# Patient Record
Sex: Male | Born: 1970 | Race: White | Hispanic: No | Smoking: Never smoker
Health system: Southern US, Community
[De-identification: ages and names within clinical notes are randomized; demographics above are authoritative.]

## PROBLEM LIST (undated history)

## (undated) DIAGNOSIS — K529 Noninfective gastroenteritis and colitis, unspecified: Secondary | ICD-10-CM

## (undated) DIAGNOSIS — M8580 Other specified disorders of bone density and structure, unspecified site: Secondary | ICD-10-CM

## (undated) DIAGNOSIS — M129 Arthropathy, unspecified: Secondary | ICD-10-CM

## (undated) DIAGNOSIS — T7840XA Allergy, unspecified, initial encounter: Secondary | ICD-10-CM

## (undated) DIAGNOSIS — J45909 Unspecified asthma, uncomplicated: Secondary | ICD-10-CM

## (undated) DIAGNOSIS — F419 Anxiety disorder, unspecified: Secondary | ICD-10-CM

## (undated) DIAGNOSIS — J703 Chronic drug-induced interstitial lung disorders: Secondary | ICD-10-CM

## (undated) DIAGNOSIS — E079 Disorder of thyroid, unspecified: Secondary | ICD-10-CM

## (undated) DIAGNOSIS — M1990 Unspecified osteoarthritis, unspecified site: Secondary | ICD-10-CM

## (undated) DIAGNOSIS — K432 Incisional hernia without obstruction or gangrene: Secondary | ICD-10-CM

## (undated) DIAGNOSIS — D239 Other benign neoplasm of skin, unspecified: Secondary | ICD-10-CM

## (undated) DIAGNOSIS — K565 Intestinal adhesions [bands], unspecified as to partial versus complete obstruction: Secondary | ICD-10-CM

## (undated) DIAGNOSIS — G43909 Migraine, unspecified, not intractable, without status migrainosus: Secondary | ICD-10-CM

## (undated) DIAGNOSIS — K219 Gastro-esophageal reflux disease without esophagitis: Secondary | ICD-10-CM

## (undated) DIAGNOSIS — Z5189 Encounter for other specified aftercare: Secondary | ICD-10-CM

## (undated) DIAGNOSIS — K5080 Crohn's disease of both small and large intestine without complications: Secondary | ICD-10-CM

## (undated) DIAGNOSIS — Z8719 Personal history of other diseases of the digestive system: Secondary | ICD-10-CM

## (undated) DIAGNOSIS — G4700 Insomnia, unspecified: Secondary | ICD-10-CM

## (undated) DIAGNOSIS — IMO0002 Reserved for concepts with insufficient information to code with codable children: Secondary | ICD-10-CM

## (undated) DIAGNOSIS — K91850 Pouchitis: Secondary | ICD-10-CM

## (undated) DIAGNOSIS — G5761 Lesion of plantar nerve, right lower limb: Secondary | ICD-10-CM

## (undated) HISTORY — DX: Noninfective gastroenteritis and colitis, unspecified: K52.9

## (undated) HISTORY — DX: Gastro-esophageal reflux disease without esophagitis: K21.9

## (undated) HISTORY — DX: Anxiety disorder, unspecified: F41.9

## (undated) HISTORY — DX: Arthropathy, unspecified: M12.9

## (undated) HISTORY — DX: Personal history of other diseases of the digestive system: Z87.19

## (undated) HISTORY — PX: COLECTOMY: SHX59

## (undated) HISTORY — DX: Unspecified asthma, uncomplicated: J45.909

## (undated) HISTORY — DX: Unspecified osteoarthritis, unspecified site: M19.90

## (undated) HISTORY — DX: Migraine, unspecified, not intractable, without status migrainosus: G43.909

## (undated) HISTORY — PX: FLEXIBLE SIGMOIDOSCOPY: SHX1649

## (undated) HISTORY — DX: Chronic drug-induced interstitial lung disorders: J70.3

## (undated) HISTORY — DX: Crohn's disease of both small and large intestine without complications: K50.80

## (undated) HISTORY — DX: Other specified disorders of bone density and structure, unspecified site: M85.80

## (undated) HISTORY — DX: Disorder of thyroid, unspecified: E07.9

## (undated) HISTORY — DX: Incisional hernia without obstruction or gangrene: K43.2

## (undated) HISTORY — DX: Other benign neoplasm of skin, unspecified: D23.9

## (undated) HISTORY — PX: OTHER SURGICAL HISTORY: SHX169

## (undated) HISTORY — DX: Encounter for other specified aftercare: Z51.89

## (undated) HISTORY — DX: Pouchitis: K91.850

## (undated) HISTORY — DX: Reserved for concepts with insufficient information to code with codable children: IMO0002

## (undated) HISTORY — DX: Allergy, unspecified, initial encounter: T78.40XA

## (undated) HISTORY — DX: Insomnia, unspecified: G47.00

---

## 2002-08-05 ENCOUNTER — Encounter: Payer: Self-pay | Admitting: Internal Medicine

## 2002-09-19 DIAGNOSIS — K5080 Crohn's disease of both small and large intestine without complications: Secondary | ICD-10-CM | POA: Insufficient documentation

## 2003-12-18 HISTORY — PX: FUNCTIONAL ENDOSCOPIC SINUS SURGERY: SUR616

## 2004-08-19 DIAGNOSIS — Z8719 Personal history of other diseases of the digestive system: Secondary | ICD-10-CM

## 2004-08-19 DIAGNOSIS — K91850 Pouchitis: Secondary | ICD-10-CM

## 2004-08-19 HISTORY — DX: Personal history of other diseases of the digestive system: Z87.19

## 2004-08-19 HISTORY — DX: Pouchitis: K91.850

## 2005-01-17 HISTORY — PX: SIGMOIDOSCOPY: SUR1295

## 2005-01-25 ENCOUNTER — Encounter: Payer: Self-pay | Admitting: Internal Medicine

## 2005-11-22 ENCOUNTER — Encounter: Payer: Self-pay | Admitting: Internal Medicine

## 2006-04-16 ENCOUNTER — Encounter: Payer: Self-pay | Admitting: Internal Medicine

## 2006-04-16 HISTORY — PX: COLONOSCOPY W/ BIOPSIES: SHX1374

## 2007-06-24 ENCOUNTER — Ambulatory Visit: Payer: Self-pay | Admitting: Internal Medicine

## 2007-06-24 LAB — CONVERTED CEMR LAB
ALT: 13 units/L (ref 0–53)
AST: 18 units/L (ref 0–37)
Albumin: 3.8 g/dL (ref 3.5–5.2)
Basophils Absolute: 0.1 10*3/uL (ref 0.0–0.1)
Calcium: 9.5 mg/dL (ref 8.4–10.5)
Chloride: 103 meq/L (ref 96–112)
Creatinine, Ser: 1 mg/dL (ref 0.4–1.5)
Eosinophils Absolute: 0.1 10*3/uL (ref 0.0–0.6)
Eosinophils Relative: 1.2 % (ref 0.0–5.0)
GFR calc non Af Amer: 91 mL/min
HCT: 47.1 % (ref 39.0–52.0)
MCHC: 34.3 g/dL (ref 30.0–36.0)
Neutrophils Relative %: 72.3 % (ref 43.0–77.0)
Phosphorus: 3 mg/dL (ref 2.3–4.6)
Platelets: 310 10*3/uL (ref 150–400)
RBC: 5.31 M/uL (ref 4.22–5.81)
RDW: 13 % (ref 11.5–14.6)
Sodium: 141 meq/L (ref 135–145)
Total Bilirubin: 2 mg/dL — ABNORMAL HIGH (ref 0.3–1.2)
Vit D, 1,25-Dihydroxy: 18 — ABNORMAL LOW (ref 30–89)
WBC: 10.7 10*3/uL — ABNORMAL HIGH (ref 4.5–10.5)

## 2007-08-03 ENCOUNTER — Encounter: Payer: Self-pay | Admitting: Internal Medicine

## 2007-10-09 DIAGNOSIS — M8580 Other specified disorders of bone density and structure, unspecified site: Secondary | ICD-10-CM | POA: Insufficient documentation

## 2007-10-09 DIAGNOSIS — M461 Sacroiliitis, not elsewhere classified: Secondary | ICD-10-CM | POA: Insufficient documentation

## 2008-02-15 ENCOUNTER — Ambulatory Visit: Payer: Self-pay | Admitting: Family Medicine

## 2008-02-15 DIAGNOSIS — L723 Sebaceous cyst: Secondary | ICD-10-CM | POA: Insufficient documentation

## 2008-05-13 ENCOUNTER — Ambulatory Visit: Payer: Self-pay | Admitting: Family Medicine

## 2008-06-01 ENCOUNTER — Encounter: Payer: Self-pay | Admitting: Internal Medicine

## 2008-08-08 ENCOUNTER — Telehealth: Payer: Self-pay | Admitting: Family Medicine

## 2008-08-18 ENCOUNTER — Ambulatory Visit: Payer: Self-pay | Admitting: Internal Medicine

## 2008-08-18 DIAGNOSIS — K91850 Pouchitis: Secondary | ICD-10-CM | POA: Insufficient documentation

## 2008-08-29 ENCOUNTER — Encounter: Payer: Self-pay | Admitting: Internal Medicine

## 2008-08-29 ENCOUNTER — Ambulatory Visit: Payer: Self-pay | Admitting: Internal Medicine

## 2008-08-29 DIAGNOSIS — K5080 Crohn's disease of both small and large intestine without complications: Secondary | ICD-10-CM

## 2008-08-29 HISTORY — DX: Crohn's disease of both small and large intestine without complications: K50.80

## 2008-09-01 ENCOUNTER — Encounter: Payer: Self-pay | Admitting: Internal Medicine

## 2008-09-13 ENCOUNTER — Encounter: Payer: Self-pay | Admitting: Internal Medicine

## 2008-09-28 ENCOUNTER — Encounter: Payer: Self-pay | Admitting: Internal Medicine

## 2008-10-03 LAB — CONVERTED CEMR LAB: Vit D, 1,25-Dihydroxy: 37 (ref 30–89)

## 2008-10-13 ENCOUNTER — Encounter: Payer: Self-pay | Admitting: Internal Medicine

## 2008-12-01 ENCOUNTER — Encounter: Payer: Self-pay | Admitting: Internal Medicine

## 2009-04-04 ENCOUNTER — Telehealth: Payer: Self-pay | Admitting: Family Medicine

## 2009-04-11 ENCOUNTER — Encounter: Payer: Self-pay | Admitting: Internal Medicine

## 2009-05-02 ENCOUNTER — Encounter: Payer: Self-pay | Admitting: Internal Medicine

## 2009-06-26 ENCOUNTER — Ambulatory Visit: Payer: Self-pay | Admitting: Family Medicine

## 2009-06-26 ENCOUNTER — Telehealth: Payer: Self-pay | Admitting: Family Medicine

## 2009-06-26 LAB — CONVERTED CEMR LAB
Free T4: 0.8 ng/dL (ref 0.6–1.6)
T3, Free: 3.3 pg/mL (ref 2.3–4.2)
TSH: 1.26 microintl units/mL (ref 0.35–5.50)

## 2009-08-10 ENCOUNTER — Telehealth: Payer: Self-pay | Admitting: Family Medicine

## 2009-08-10 ENCOUNTER — Ambulatory Visit: Payer: Self-pay | Admitting: Family Medicine

## 2009-08-10 LAB — CONVERTED CEMR LAB: Uric Acid, Serum: 7.5 mg/dL (ref 4.0–7.8)

## 2009-08-21 ENCOUNTER — Encounter: Payer: Self-pay | Admitting: Internal Medicine

## 2009-08-21 ENCOUNTER — Telehealth (INDEPENDENT_AMBULATORY_CARE_PROVIDER_SITE_OTHER): Payer: Self-pay

## 2009-09-05 ENCOUNTER — Encounter: Payer: Self-pay | Admitting: Internal Medicine

## 2010-01-08 ENCOUNTER — Encounter: Payer: Self-pay | Admitting: Internal Medicine

## 2010-04-13 ENCOUNTER — Ambulatory Visit: Payer: Self-pay | Admitting: Psychology

## 2010-04-17 ENCOUNTER — Ambulatory Visit: Payer: Self-pay | Admitting: Internal Medicine

## 2010-04-27 ENCOUNTER — Ambulatory Visit: Payer: Self-pay | Admitting: Psychology

## 2010-04-30 ENCOUNTER — Encounter: Payer: Self-pay | Admitting: Family Medicine

## 2010-05-04 ENCOUNTER — Ambulatory Visit: Payer: Self-pay | Admitting: Psychology

## 2010-05-11 ENCOUNTER — Ambulatory Visit: Payer: Self-pay | Admitting: Psychology

## 2010-05-21 ENCOUNTER — Ambulatory Visit: Payer: Self-pay | Admitting: Psychology

## 2010-05-24 ENCOUNTER — Telehealth: Payer: Self-pay | Admitting: Internal Medicine

## 2010-06-18 ENCOUNTER — Encounter: Payer: Self-pay | Admitting: Internal Medicine

## 2010-06-19 ENCOUNTER — Telehealth: Payer: Self-pay | Admitting: Internal Medicine

## 2010-06-22 ENCOUNTER — Encounter: Payer: Self-pay | Admitting: Family Medicine

## 2010-06-22 ENCOUNTER — Ambulatory Visit: Payer: Self-pay | Admitting: Psychology

## 2010-07-11 ENCOUNTER — Ambulatory Visit: Payer: Self-pay | Admitting: Psychology

## 2010-07-20 ENCOUNTER — Ambulatory Visit: Payer: Self-pay | Admitting: Psychology

## 2010-08-03 ENCOUNTER — Ambulatory Visit: Payer: Self-pay | Admitting: Psychology

## 2010-08-09 ENCOUNTER — Ambulatory Visit: Payer: Self-pay | Admitting: Psychology

## 2010-08-17 ENCOUNTER — Ambulatory Visit: Payer: Self-pay | Admitting: Psychology

## 2010-08-31 ENCOUNTER — Ambulatory Visit
Admission: RE | Admit: 2010-08-31 | Discharge: 2010-08-31 | Payer: Self-pay | Source: Home / Self Care | Attending: Psychology | Admitting: Psychology

## 2010-09-14 ENCOUNTER — Ambulatory Visit
Admission: RE | Admit: 2010-09-14 | Discharge: 2010-09-14 | Payer: Self-pay | Source: Home / Self Care | Attending: Psychology | Admitting: Psychology

## 2010-09-18 NOTE — Letter (Signed)
Summary: Immunization record  Proof of Immunizations/vendor license/[facility]   Imported By: Bubba Hales 08/24/2009 08:26:38  _____________________________________________________________________  External Attachment:    Type:   Image     Comment:   External Document

## 2010-09-18 NOTE — Progress Notes (Signed)
Summary: elevated bilii = Gilbert's   Phone Note Other Incoming   Caller: Dr. Tobie Lords Summary of Call: T bili ios 2+ with all other LFTs ok old labs show t. 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

## 2010-09-18 NOTE — Miscellaneous (Signed)
Summary: ppd reading   Clinical Lists Changes  Observations: Added new observation of TB PPDRESULT: negative (06/22/2010 14:53) Added new observation of PPD RESULT: < 38m (06/22/2010 14:53) Added new observation of TB-PPD RDDTE: 05/02/2010 (06/22/2010 14:53)      PPD Results    Date of reading: 05/02/2010    Results: < 578m   Interpretation: negative

## 2010-09-18 NOTE — Assessment & Plan Note (Signed)
Summary: f/u--ch.    History of Present Illness Visit Type: Follow-up Visit Primary GI MD: Silvano Rusk MD [facility] Primary Provider: Stevie Kern, MD Requesting Provider: n/a Chief Complaint: Patient here for f/u He states that he is feeling better at this time. History of Present Illness:   40 yo with Crohn's disease s/p proctocolectomy (originally thought to have UC) He has been well on Remicade and receiving it through [facility] Occasionally he will have a painful but reducible hernia through his incision. Has seen Dr. Margot Chimes who advised no surgery unless absolutely needed.  We discussed need for vaccines today. Thinks he had Pneumovax 4-5 yrs ago at [facility]. has had A and B vaccines running and exercising, training for marathons  wife busy with CRNA school, au pair helping with kids      GI Review of Systems      Denies abdominal pain, acid reflux, belching, bloating, chest pain, dysphagia with liquids, dysphagia with solids, heartburn, loss of appetite, nausea, vomiting, vomiting blood, weight loss, and  weight gain.        Denies anal fissure, black tarry stools, change in bowel habit, constipation, diarrhea, diverticulosis, fecal incontinence, heme positive stool, hemorrhoids, irritable bowel syndrome, jaundice, light color stool, liver problems, rectal bleeding, and  rectal pain.    Current Medications (verified): 1)  Remicade 100 Mg Solr (Infliximab) .... Infuse Every 6 Weeks 2)  D 1000 Plus  Tabs (Fa-Cyanocobalamin-B6-D-Ca) .... Take 1 Tablet By Mouth Once A Day As Needed 3)  Qvar 40 Mcg/act Aers (Beclomethasone Dipropionate) .... Take 1 Puff By Mouth Twice Daily  Allergies (verified): 1)  ! * 6 Mercaptopurine  Past History:  Past Medical History: Reviewed history from 08/18/2008 and no changes required. Crohn's Colitis/Ileitis, pouchitis Incisional Hernia Herniated disc Osteopenia Hypovitaminosis D Inflammatory Arthropathy and  Sacroiliitis Hyperthyroidism/Thyroiditis Dysplastic nevi Folliculitis Chronic lung disease associated with IBD  Past Surgical History: Total colectomy Ileoanal pull-through Functional endoscopic sinus surgery  Family History: Reviewed history from 08/18/2008 and no changes required. father a pulmonologist in [place] in excellent health. Mother also in excellent health.  Has had a history of ulcerative colitis. One sister Raquel Sarna in good health.  No medical problems Family History of Colon Cancer: Grandmother Family History of Colitis/Crohn's: Mother (colitis)  Social History: Occupation: Secondary school teacher rep Married (Dr. Honor Junes daughter), 2 children Wife RN [employer] ICU and in nurse anesthetists school Never Smoked Alcohol use-yes-1 once daily  Drug use-no Regular exercise-yes  Vital Signs:  Patient profile:   40 year old male Height:      70 inches Weight:      151 pounds BMI:     21.74 BSA:     1.85 Pulse rate:   68 / minute Pulse rhythm:   regular BP sitting:   100 / 68  (right arm) Cuff size:   regular  Vitals Entered By: Madlyn Frankel CMA Deborra Medina) (April 17, 2010 10:53 AM)  Physical Exam  General:  Well developed, well nourished, no acute distress. Lungs:  Clear throughout to auscultation. Heart:  Regular rate and rhythm; no murmurs, rubs,  or bruits. Abdomen:  surgical scars low midline and RLQ some hernia effect soft non-tender no masses   Impression & Recommendations:  Problem # 1:  CROHN'S DISEASE, LARGE AND SMALL INTESTINES (ICD-555.2) Assessment Unchanged IBD dx 2004, UC vs. indeterminant colitis. ended up with colectomy and ileostomy then ileoanal pull through, J pouch. Subsequent pouchitis and realization of Crohn's disease. Had lung disease related to Crohn's and  Remicade started about 2007 and has helped tremendously. Allergic to 6MP. Also with sacroiliitis in past Overall well at this time and to continue Remicade See me about once a year routinely  and as needed for problems.  Problem # 2:  LONG-TERM USE OF REMICADE (ICD-V58.69) Assessment: Unchanged we discussed vaccines today and will catch up on those. He will request influenza and Pneumovax through PCP Dr. Sherren Mocha. He has had Hep A and B vaccines, varicellavaccine or immunity, DpT 2007 and MMR vaccines.  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  Patient Instructions: 1)  Please schedule a follow-up appointment in 1 year.  We will send you a reminder. 2)  Please discuss Pneumovax with Dr. Sherren Mocha at your next visit. 3)  Copy sent to : Tobie Lords, MD, Stevie Kern, MD 4)  The medication list was reviewed and reconciled.  All changed / newly prescribed medications were explained.  A complete medication list was provided to the patient / caregiver.

## 2010-09-18 NOTE — Progress Notes (Signed)
Summary: PPD   ---- 05/24/2010 2:21 PM, Westley Hummer CMA Deborra Medina) wrote: Colletta Maryland,  He came for a placement of a PPD but he did not return for reading.  thanks  rachel  ---- 05/22/2010 9:02 AM, Abelino Derrick CMA (AAMA) wrote: Rayburn Go,  This pt came in for his PPD on 04/30/10---do you know if he came back to have it read?  Is it documented somewhere?  We need documentation for his Remicade infusions.  If he did have it read can your office document it in the flowsheet? Thanks for your help. Colletta Maryland ------------------------------  Phone Note Outgoing Call   Call placed by: Abelino Derrick CMA Deborra Medina),  May 24, 2010 2:45 PM Details for Reason: pt needs PPD for Remicade Summary of Call: LM to [place] at home and work number. Initial call taken by: Abelino Derrick CMA Deborra Medina),  May 24, 2010 2:46 PM  Follow-up for Phone Call        [facility] from pt.  He states that Dr. Sherren Mocha looked at PPD on 05/02/10 and it was negative.  I advised pt to see if Dr. Sherren Mocha or Dorian Pod could chart negative PPD results in EMR.  Pt aware that he will need second PPD if this can not be done. Immunization record updated. Follow-up by: Abelino Derrick CMA Deborra Medina),  May 24, 2010 3:12 PM      Immunization History:  Hepatitis B Immunization History:    Hepatitis B # 1:  historical (01/11/1992)    Hepatitis B # 2:  historical (02/11/1992)    Hepatitis B # 3:  historical (07/13/1992)  MMR Immunization History:    MMR # 1:  historical (10/19/1973)    MMR # 2:  historical (03/21/1978)  Varicella Immunization History:    History of chickenpox:  yes (11/17/1980)  Tetanus/Td Immunization History:    Tetanus/Td:  historical (06/19/2006)  Hepatitis A Immunization History:    Hepatitis A # 1:  historical (08/24/2005)    Hepatitis A # 2:  historical (02/04/2006)  Appended Document: PPD I spoke to [facility] again today and he will go by Dr. Honor Junes office today to get this taken care of.  Advised pt  that we would have cancel next Remicade infusion if this is not taken care of.  Pt voices understanding.

## 2010-09-18 NOTE — Progress Notes (Signed)
Summary: Schedule PPD skin test   Phone Note Outgoing Call Call back at [facility] Phone [phone]   Call placed by: Barb Merino RN, CGRN,  August 21, 2009 10:53 AM Call placed to: Patient Summary of Call: I called patient to schedule his annual PPD skin test, he informed me had it done with Dr Sherren Mocha, He has faxed me a copy of the results and I will enter it on the flow sheet. Negative result from 05-18-09 Initial call taken by: Barb Merino RN, [place],  August 21, 2009 10:54 AM

## 2010-09-18 NOTE — Miscellaneous (Signed)
Summary: vaccines   Clinical Lists Changes  Observations: Added new observation of FLU VAX: Historical (04/30/2010 16:45) Added new observation of PNEUMOVAX: Historical (04/30/2010 16:45)      Immunization History:  Pneumovax Immunization History:    Pneumovax:  historical (04/30/2010)  Influenza Immunization History:    Influenza:  historical (04/30/2010)  ppd given today

## 2010-09-24 ENCOUNTER — Other Ambulatory Visit: Payer: Self-pay | Admitting: *Deleted

## 2010-09-24 DIAGNOSIS — R05 Cough: Secondary | ICD-10-CM

## 2010-09-24 DIAGNOSIS — R059 Cough, unspecified: Secondary | ICD-10-CM

## 2010-09-24 MED ORDER — HYDROCODONE-HOMATROPINE 5-1.5 MG/5ML PO SYRP: 5.0000 mL | ORAL_SOLUTION | Freq: Every day | ORAL | Status: AC

## 2010-09-24 NOTE — Telephone Encounter (Signed)
rx called into [facility]

## 2010-09-28 ENCOUNTER — Ambulatory Visit (INDEPENDENT_AMBULATORY_CARE_PROVIDER_SITE_OTHER): Payer: 59 | Admitting: Psychology

## 2010-09-28 DIAGNOSIS — F411 Generalized anxiety disorder: Secondary | ICD-10-CM

## 2010-10-12 ENCOUNTER — Ambulatory Visit (INDEPENDENT_AMBULATORY_CARE_PROVIDER_SITE_OTHER): Payer: 59 | Admitting: Psychology

## 2010-10-12 DIAGNOSIS — F411 Generalized anxiety disorder: Secondary | ICD-10-CM

## 2010-10-29 ENCOUNTER — Other Ambulatory Visit: Payer: Self-pay | Admitting: *Deleted

## 2010-10-29 MED ORDER — ALBUTEROL SULFATE HFA 108 (90 BASE) MCG/ACT IN AERS: 2.0000 | INHALATION_SPRAY | Freq: Four times a day (QID) | RESPIRATORY_TRACT | Status: DC | PRN

## 2010-10-29 MED ORDER — PREDNISONE 10 MG PO TABS: 10.0000 mg | ORAL_TABLET | Freq: Every day | ORAL | Status: AC

## 2011-01-01 NOTE — Assessment & Plan Note (Signed)
[place] OFFICE NOTE   NAME:Charles [name], Charles [name]                         MRN:          [id number]  DATE:06/24/2007                            DOB:          1970/11/23    CHIEF COMPLAINT:  Crohn's disease, on Remicade, establish care.   HISTORY:  Dvid is a 40 year old white man, Dr. Honor Junes son-in-law, who  developed inflammatory bowel disease problems in 2004.  He was in  [place].  He was thought to have ulcerative colitis or indeterminate  colitis.  He had a total colectomy, and eventually an ileoanal pull  through after he had J-pouch creation and then take down of his  ileostomy and ileoanal anastomosis.  Subsequent to that, he had problems  with pouchitis and ulcers, and through workups with the [facility] and  at the [place], it was determined that he probably had  Crohn's disease.  A variety of treatments were tried, but he ended up on  Remicade therapy which has controlled his symptoms, but he has to take  it every 6 weeks.  He just had a treatment last week, he is on 5 mg/kg  dosing.  He is in the process of moving his family here.  He had been in  the [place] area, he works for [employer], in Estate agent.  He had his  last Remicade back in [place] at [facility] under  the direction of Dr. David Stall.  His father is also a physician, a  pulmonologist in [place].  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  He has had  inflammatory arthropathy with the sacroiliitis.  He has had a history of  disk herniation with sciatic and residual weakness in the right leg  noted from [facility] of May 2007.  I do not think that is an  active problem now.   MEDICATIONS:  1. Remicade 5 mg/kg every 6 weeks.  2. Qvar 80 mcg two twice daily.  3. Flovent.   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   HISTORY:  Additional history as above.  The patient had been on Enbrel  which had sort of helped his lung and rheumatologic problems but  subsequently was found that he had Crohn's and he was switched to  Remicade.  He has had ileitis discovered subsequent to his original  pouchitis problems.  We have a colonoscopy report from August, 2007  demonstrating serpiginous ulcers in the past suspicious for Crohn's  ileitis and an ileal ulcer suspicious for Crohn's ileitis as well.  He  had a sigmoidoscopy June, 2006 with pouchitis.  Biopsies demonstrated  inflammatory changes with Crohn's disease.  The last bone densitometry I  have is from November 22, 2005 with a T score of -1.5 in the lumbar spine,  left hip is -.03, right hip -.04, left forearm 2.5. He was osteopenic in  the spine.  Note that  he is hepatitis B surface antigen negative in  October, 2007 prior to starting Remicade as well as hepatitis B core  antibody totals negative.  His [employer] IBD first step confirmatory  system showed that markers were confirmed.  They were suggestive of  ulcerative colitis but he acts like Crohn's disease.   FAMILY HISTORY:  His mother has ulcerative colitis.   PAST MEDICAL HISTORY:  As described above.   SOCIAL HISTORY:  He is married as mentioned above.  He has one son.  He  is an Loss adjuster, chartered.  He drinks some alcohol.  No tobacco or drugs.   REVIEW OF SYSTEMS:  He does have some back pain, some allergic sinus  problems.  All other systems are negative.   PHYSICAL EXAMINATION:  GENERAL APPEARANCE:  Reveals a well-developed,  thin, white male.  VITAL SIGNS:  Height 5 feet 11 inches. Weight 154.  Blood pressure  104/80. Pulse 76 and regular.  HEENT:  The eyes are anicteric.  ENT:  Normal mouth, nose and pharynx.  No mouth ulcers.  Lips, teeth  and gums in good repair.  NECK:  Supple. No thyromegaly or mass.  LUNGS:  Clear, resonant.  HEART:  S1, S2.  No rubs, no gallops.  ABDOMEN:  Shows a low midline scar as well as a right ileostomy scar.  There may be some small herniation in these areas.  RECTAL:  Inspection shows a small erythematous lesion on the right  cheek.  There is no fluctuance below it; there is no purulent drainage.  There is some mild skin irritation around the anus.  There are no  significant skin tags, fissuring or changes like that noticed, no  fistulae or abscess.  LYMPHATIC:  No neck, supraclavicular or groin adenopathy.  MUSCULOSKELETAL:  There is no obvious arthritic deformity of the hands.  SKIN:  Multiple nevi.  Warm, dry, no acute rash.  NEUROPSYCHIATRIC:  He is alert and oriented x3.  Appropriate affect.   ASSESSMENT:  1. Crohn's disease status post total colectomy and ileoanal pull-      through with original diagnosis being severe ulcerative colitis.  2. Pouchitis and ileitis problems as above.  3. Sacroiliitis and inflammatory arthropathy related to Crohn's      disease.  4. Chronic lung disease related to inflammatory bowel disease.  5. Transient hyperthyroidism in the past.  6. Incisional hernia.  7. Osteopenia with low vitamin E levels in the past.  8. Chronic Remicade therapy.  9. Note that he has had Celiac antibodies that are negative as well.   PLAN:  1. Labs today to include CBC, CMET, 250-hydroxy vitamin D level, TSH,      C-reactive protein, phosphorus level.  These are all normal except      his C-reactive protein that is slightly up at 6.  His TSH is 1.44.      Liver function tests, phosphorus normal.  Calcium level okay at      9.5.  His white count was slightly up at 10.7.  There is a minimal      monocytosis on the machine automated differential.  His bilirubin      is 2.0 total with a direct of 0.3 suggestive of Gilbert's.  2. Referral to Dr. Thomos Lemons for followup of his  arthropathy as well      as Remicade infusions every 6 weeks.  He will be due the week      before Christmas.  3. Will either need a primary  care physician or perhaps a      pulmonologist to follow him given his history of lung disease.  I      do not have any of the studies from his lung disease in the records      that I received, i.e., bronchoscopy, etc.  He has had a      bronchoscopy in [place]. His father's partner apparently performed      that and diagnosed this inflammatory lung process.  As best I can      tell, that is better.  He may need some chest imaging.  It looks      like he had chronic nodularity in the lungs, thought secondary to      the inflammatory bowel disease from the information I have.  4. He has had a DEXA scan recently and he will bring those results to      me.   I appreciate the opportunity to care for this patient.     Gatha Mayer, MD,FACG  Electronically Signed    CEG/MedQ  DD: 06/25/2007  DT: 06/26/2007  Job #: [id number]   cc:   Michael Litter, M.D.

## 2011-01-01 NOTE — Assessment & Plan Note (Signed)
[place] OFFICE NOTE   NAME:[name], Charles                         MRN:          [id number]  DATE:06/24/2007                            DOB:          06-21-1971    CHIEF COMPLAINT:  Crohn's disease, on Remicade, establish care.   HISTORY:  [name] is a 40 year old white man, Dr. Honor Junes son-in-law, who  developed inflammatory bowel disease problems in 2004.  He was in  [place].  He was thought to have ulcerative colitis or indeterminate  colitis.  He had a total colectomy, and eventually an ileoanal pull  through after he had J-pouch creation and then take down of his  ileostomy and ileoanal anastomosis.  Subsequent to that, he had problems  with pouchitis and ulcers, and through workups with the [facility] and  at the [place], it was determined that he probably had  Crohn's disease.  A variety of treatments were tried, but he ended up on  Remicade therapy which has controlled his symptoms, but he has to take  it every 6 weeks.  He just had a treatment last week, he is on 5 mg/kg  dosing.  He is in the process of moving his family here.  He had been in  the [place] area, he works for [employer], the Art gallery manager.  He  had his last Remicade back in [place] at [facility]  under the direction of Dr. David Stall.  His father is also a physician, a  pulmonologist in [place].  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  He has had  inflammatory arthropathy with the sacroiliitis.  He has had a history of  disk herniation with sciatic and residual weakness in the right leg  noted from [facility] of May 2007.  I do not think that is  an  active problem now.   INCOMPLETE     Gatha Mayer, MD,FACG  Electronically Signed    CEG/MedQ  DD: 06/25/2007  DT: 06/26/2007  Job #: [id number]

## 2011-01-18 ENCOUNTER — Other Ambulatory Visit: Payer: Self-pay | Admitting: Family Medicine

## 2011-01-21 ENCOUNTER — Other Ambulatory Visit: Payer: Self-pay | Admitting: *Deleted

## 2011-01-21 MED ORDER — ELETRIPTAN HYDROBROMIDE 20 MG PO TABS: 20.0000 mg | ORAL_TABLET | ORAL | Status: DC | PRN

## 2011-02-26 ENCOUNTER — Other Ambulatory Visit: Payer: Self-pay | Admitting: *Deleted

## 2011-02-26 MED ORDER — CEPHALEXIN 500 MG PO CAPS: 500.0000 mg | ORAL_CAPSULE | Freq: Two times a day (BID) | ORAL | Status: AC

## 2011-03-11 ENCOUNTER — Other Ambulatory Visit: Payer: Self-pay | Admitting: *Deleted

## 2011-03-11 MED ORDER — FLUOCINONIDE 0.05 % EX GEL: Freq: Two times a day (BID) | CUTANEOUS | Status: AC

## 2011-04-11 ENCOUNTER — Encounter: Payer: Self-pay | Admitting: Internal Medicine

## 2011-04-30 ENCOUNTER — Ambulatory Visit (INDEPENDENT_AMBULATORY_CARE_PROVIDER_SITE_OTHER): Payer: 59 | Admitting: Family Medicine

## 2011-04-30 ENCOUNTER — Encounter: Payer: Self-pay | Admitting: Family Medicine

## 2011-04-30 DIAGNOSIS — Z0000 Encounter for general adult medical examination without abnormal findings: Secondary | ICD-10-CM

## 2011-04-30 DIAGNOSIS — K5080 Crohn's disease of both small and large intestine without complications: Secondary | ICD-10-CM

## 2011-04-30 DIAGNOSIS — Z23 Encounter for immunization: Secondary | ICD-10-CM

## 2011-04-30 LAB — POCT URINALYSIS DIPSTICK
Blood, UA: NEGATIVE
Protein, UA: NEGATIVE
Spec Grav, UA: 6
Urobilinogen, UA: 0.2

## 2011-04-30 NOTE — Progress Notes (Signed)
  Subjective:    Patient ID: Charles [name], male    DOB: 1971-04-17, 40 y.o.   MRN: [id number]  HPI Mat Is a 40 year old, married man nonsmoker comes in today for a general medical examination for his employer [employer]  He is seen by Dr. Tobie Lords every 6 weeks and gets Remicade because of his history of underlying Crohn's disease.  Dr. Ouida Sills does his labs and follows those.  His last treatment was at an 8 week interval.  Because he was on Avelox for two weeks because of sinusitis.  The sinusitis has resolved.  He currently takes Qvar 81 puff b.i.d. And vitamin D because of a history of osteopenia.  Is also considering a facetectomy, and has a varicocele.  It seems to be getting bigger in the left scrotum.  He would like checked.  I referred him to Dr. Mikey Kirschner.    His vaccinations were reviewed and brought up to date.  He gets biannual dermatologic evaluations by Dr. Amy Martinique.  His dermatologist because of a history of dysplastic nevi.   Review of Systems General review of systems otherwise negative    Objective:   Physical Exam  Well-developed well-nourished, male in no acute distress     Assessment & Plan:  Crohn's disease, currently stable.  History of osteopenia continue vitamin D, diet and exercise.  Varicocele left and consideration of a mastectomy referred to Dr. Keturah Barre.

## 2011-05-20 ENCOUNTER — Other Ambulatory Visit: Payer: Self-pay | Admitting: *Deleted

## 2011-05-20 MED ORDER — HYDROCODONE-HOMATROPINE 5-1.5 MG/5ML PO SYRP: 5.0000 mL | ORAL_SOLUTION | Freq: Four times a day (QID) | ORAL | Status: AC | PRN

## 2011-05-31 ENCOUNTER — Ambulatory Visit (INDEPENDENT_AMBULATORY_CARE_PROVIDER_SITE_OTHER): Payer: BC Managed Care – PPO | Admitting: Internal Medicine

## 2011-05-31 ENCOUNTER — Encounter: Payer: Self-pay | Admitting: Internal Medicine

## 2011-05-31 VITALS — BP 124/76 | HR 88 | Ht 70.0 in | Wt 151.0 lb

## 2011-05-31 DIAGNOSIS — D899 Disorder involving the immune mechanism, unspecified: Secondary | ICD-10-CM

## 2011-05-31 DIAGNOSIS — D849 Immunodeficiency, unspecified: Secondary | ICD-10-CM

## 2011-05-31 DIAGNOSIS — M461 Sacroiliitis, not elsewhere classified: Secondary | ICD-10-CM

## 2011-05-31 DIAGNOSIS — Z79899 Other long term (current) drug therapy: Secondary | ICD-10-CM | POA: Insufficient documentation

## 2011-05-31 DIAGNOSIS — K5080 Crohn's disease of both small and large intestine without complications: Secondary | ICD-10-CM

## 2011-05-31 NOTE — Progress Notes (Signed)
  Subjective:    Patient ID: Charles [name], male    DOB: April 22, 1971, 40 y.o.   MRN: [id number]  HPI 40 year old married white man with Crohn's disease. Maintained on Remicade. Here for annual followup. He has had some short spells of diarrhea at times but thinks things are a bit better since his wife has completed CRNA school and there is less stress.He has been doing well overall. He thinks that some of his diarrhea spells are also related to dietary indiscretion. Two marathons in last year. Work going well.    Review of Systems As above    Objective:   Physical Exam General:  Thin WDWN NAD Eyes: anicteric Lungs: clear Heart: S1S2 no rubs, murmurs or gallops Abdomen: soft and nontender, BS+, surgical scars present with ventral hernia Rectal :  Inspected and no abnormalities Ext: no edema          Assessment & Plan:

## 2011-05-31 NOTE — Patient Instructions (Signed)
return to see Dr. Carlean Purl in 1 year.

## 2011-05-31 NOTE — Assessment & Plan Note (Addendum)
Doing well - any Remicade per Dr. Ouida Sills. He can see me annually and sooner as needed. He raised the question of when he could ever stop Remicade and is really not known. Given all the problems he had over the years do not recommend that at this time. We'll see what the future brings as far as therapy and understanding of this.

## 2011-05-31 NOTE — Assessment & Plan Note (Signed)
Reviewed immunosuppression and higher risk of infections. Extra vigilance advised.

## 2011-06-11 ENCOUNTER — Telehealth: Payer: Self-pay

## 2011-06-11 NOTE — Telephone Encounter (Signed)
I have left a message for the patient to call to discuss annual TB skin test and flu vaccine for Remicade

## 2011-06-18 NOTE — Telephone Encounter (Signed)
Left message for patient to call back  

## 2011-06-18 NOTE — Telephone Encounter (Signed)
Patient had had PPD skin test and Flu vaccine in September with Dr Sherren Mocha

## 2011-09-20 ENCOUNTER — Other Ambulatory Visit: Payer: Self-pay | Admitting: *Deleted

## 2011-09-20 MED ORDER — CEPHALEXIN 500 MG PO CAPS: 500.0000 mg | ORAL_CAPSULE | Freq: Two times a day (BID) | ORAL | Status: DC

## 2011-09-20 MED ORDER — HYDROCODONE-ACETAMINOPHEN 7.5-750 MG PO TABS: 1.0000 | ORAL_TABLET | Freq: Three times a day (TID) | ORAL | Status: AC | PRN

## 2012-03-24 ENCOUNTER — Ambulatory Visit: Payer: BC Managed Care – PPO | Admitting: Internal Medicine

## 2012-04-10 ENCOUNTER — Encounter: Payer: Self-pay | Admitting: Internal Medicine

## 2012-04-10 ENCOUNTER — Ambulatory Visit (INDEPENDENT_AMBULATORY_CARE_PROVIDER_SITE_OTHER): Payer: BC Managed Care – PPO | Admitting: Internal Medicine

## 2012-04-10 VITALS — BP 96/60 | HR 60 | Ht 69.5 in | Wt 151.5 lb

## 2012-04-10 DIAGNOSIS — K5080 Crohn's disease of both small and large intestine without complications: Secondary | ICD-10-CM

## 2012-04-10 DIAGNOSIS — D899 Disorder involving the immune mechanism, unspecified: Secondary | ICD-10-CM

## 2012-04-10 DIAGNOSIS — D849 Immunodeficiency, unspecified: Secondary | ICD-10-CM

## 2012-04-10 NOTE — Patient Instructions (Addendum)
You have been scheduled for a flexible sigmoidoscopy. Please follow the written instructions given to you at your visit today. If you use inhalers (even only as needed), please bring them with you on the day of your procedure.  We will obtain your last office note and labs from Dr. Ardis Hughs office.  Thank you for choosing me and [facility].  Gatha Mayer, M.D., [facility]

## 2012-04-10 NOTE — Assessment & Plan Note (Addendum)
He continues to do well as far as we know he is in remission. Plan on evaluation of his ileoanal pouch to look for any recurrent pouchitis which has been a problem.  He would like to consider coming off the medication, it's a reasonable consideration. Because he is pursuing life insurance right now it has been recommended that he not make any major changes so would not do that now but I will investigate as best I can what the potential outcomes R., based upon the literature. However ultimately, I think he knows that his trial of air, and there is a risk of needing continued therapy though perhaps not something as severe as Remicade since he has had a total colectomy. He is labeled as Crohn's disease though there are features that indicate he has ulcerative colitis as well based upon serology. He also had significant arthritis problems and lung disease thought related to his IBD. He has been doing well for approximately 5-6 years per Will take this in the consideration with any decision.

## 2012-04-11 ENCOUNTER — Encounter: Payer: Self-pay | Admitting: Internal Medicine

## 2012-04-11 NOTE — Progress Notes (Signed)
  Subjective:    Patient ID: Charles [name], male    DOB: [birth year], 41 y.o.   MRN: [id number]  HPI All of his inflammatory bowel disease. He remains on Remicade 5 mg per kilogram every 6 weeks through Dr. Ouida Sills. He has laboratory studies through that clinic. He reports no significant complaints at this time. He is pursuing life insurance and wonders if he really needs to continue Remicade chronically. His inflammatory bowel disease diagnosis and the use of Remicade substantially increases his preemie of this. After his visit I obtained copies of his 02/27/2012 note from Dr. Ouida Sills of rheumatology where he was doing well. Medications, allergies, past medical history, past surgical history, family history and social history are reviewed and updated in the EMR.   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    Objective:   Physical Exam General:  NAD, thin Eyes:   anicteric Lungs:  clear Heart:  S1S2 no rubs, murmurs or gallops Abdomen:  soft and nontender, BS+ surgical scars are present.   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 The remainder of that lab tests is normal. His white count was 5.5 hemoglobin 14.3 MCV 94. 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       Assessment & Plan:   1. CROHN'S DISEASE, LARGE AND SMALL INTESTINES   2. 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 Please see the problem oriented charting as well. 2. Plan for evaluation of his ileoanal pouch via endoscope. The risks and benefits as well as alternatives of endoscopic procedure(s) have been discussed and reviewed. All questions answered. The patient agrees to proceed. Further plans pending this, I do think is reasonable to consider stopping the Remicade and  observing plus or minus some other type of chronic therapy like VSL probiotic or intermittent antibiotics.  I appreciate the opportunity to care for this patient.  Cc: A. Tobie Lords, MD

## 2012-04-11 NOTE — Assessment & Plan Note (Signed)
No current problems he is tolerating Remicade therapy well. It is a legitimate question as to how long he needs to continue this. I will look into that.

## 2012-05-05 ENCOUNTER — Encounter: Payer: Self-pay | Admitting: Gastroenterology

## 2012-05-18 ENCOUNTER — Telehealth: Payer: Self-pay | Admitting: *Deleted

## 2012-05-18 MED ORDER — NAPHAZOLINE-ZINC SULFATE 0.02 % OP SOLN: 1.0000 [drp] | Freq: Two times a day (BID) | OPHTHALMIC | Status: DC

## 2012-05-18 NOTE — Telephone Encounter (Signed)
Patient calling for Rx for allergies for eyes. Vascon - A per Dr Sherren Mocha

## 2012-05-26 ENCOUNTER — Ambulatory Visit (INDEPENDENT_AMBULATORY_CARE_PROVIDER_SITE_OTHER): Payer: BC Managed Care – PPO | Admitting: Family Medicine

## 2012-05-26 ENCOUNTER — Encounter: Payer: Self-pay | Admitting: Family Medicine

## 2012-05-26 DIAGNOSIS — D235 Other benign neoplasm of skin of trunk: Secondary | ICD-10-CM

## 2012-05-26 DIAGNOSIS — Z0000 Encounter for general adult medical examination without abnormal findings: Secondary | ICD-10-CM

## 2012-05-26 DIAGNOSIS — Z23 Encounter for immunization: Secondary | ICD-10-CM

## 2012-05-26 NOTE — Progress Notes (Signed)
  Subjective:    Patient ID: Charles [name], male    DOB: [birth year], 41 y.o.   MRN: [id number]  HPImat is a 41 year old married male nonsmoker who comes in today for removal of 2 mol on his back  He's had a history of dysplastic nevi  Lesion #1 is 5 mm x5 mm left posterior upper back  Lesion #2 5 mm x 5 mm left flank T12  Both lesions were anesthetized with 1% Xylocaine with epinephrine after informed consent. The lesions were removed with 3 mm margins base was cauterized Band-Aids were applied they were sent for analysis. He tolerated the procedure no complications.  He was also given a flu shot and a TB skin test which will be read back-41-year-old Thursday    Review of Systems    general review of systems negative except for recurrent dysplastic nevi so far thankfully no skin cancers Objective:   Physical Exam Procedure see above       Assessment & Plan:  Probable dysplastic nevi x2 path pending

## 2012-05-26 NOTE — Patient Instructions (Signed)
Within 2 weeks we will call you the report

## 2012-05-28 LAB — TB SKIN TEST: TB Skin Test: NEGATIVE

## 2012-05-29 ENCOUNTER — Ambulatory Visit (AMBULATORY_SURGERY_CENTER): Payer: BC Managed Care – PPO | Admitting: Internal Medicine

## 2012-05-29 ENCOUNTER — Encounter: Payer: Self-pay | Admitting: Internal Medicine

## 2012-05-29 VITALS — BP 114/79 | HR 65 | Temp 98.1°F | Resp 11 | Ht 69.0 in | Wt 151.0 lb

## 2012-05-29 DIAGNOSIS — K91850 Pouchitis: Secondary | ICD-10-CM

## 2012-05-29 DIAGNOSIS — K5080 Crohn's disease of both small and large intestine without complications: Secondary | ICD-10-CM

## 2012-05-29 MED ORDER — SODIUM CHLORIDE 0.9 % IV SOLN: 500.0000 mL | INTRAVENOUS | Status: DC

## 2012-05-29 NOTE — Progress Notes (Signed)
Pt voided clear colored urine in the procedure room pre-sedation. Maw

## 2012-05-29 NOTE — Progress Notes (Signed)
Patient did not experience any of the following events: a burn prior to discharge; a fall within the facility; wrong site/side/patient/procedure/implant event; or a hospital transfer or hospital admission upon discharge from the facility. (G8907) Patient did not have preoperative order for IV antibiotic SSI prophylaxis. (G8918)  

## 2012-05-29 NOTE — Patient Instructions (Addendum)
There were some small ulcers and other inflammatory changes in the pouch. Biopsies taken. The ileum was inspected 100 cm and looked ok.  I will call you about the biopsy results.  Gatha Mayer, MD, FACG  YOU HAD AN ENDOSCOPIC PROCEDURE TODAY AT [facility]: Refer to the procedure report that was given to you for any specific questions about what was found during the examination.  If the procedure report does not answer your questions, please call your gastroenterologist to clarify.  If you requested that your care partner not be given the details of your procedure findings, then the procedure report has been included in a sealed envelope for you to review at your convenience later.  YOU SHOULD EXPECT: Some feelings of bloating in the abdomen. Passage of more gas than usual.  Walking can help get rid of the air that was put into your GI tract during the procedure and reduce the bloating. If you had a lower endoscopy (such as a colonoscopy or flexible sigmoidoscopy) you may notice spotting of blood in your stool or on the toilet paper. If you underwent a bowel prep for your procedure, then you may not have a normal bowel movement for a few days.  DIET: Your first meal following the procedure should be a light meal and then it is ok to progress to your normal diet.  A half-sandwich or bowl of soup is an example of a good first meal.  Heavy or fried foods are harder to digest and may make you feel nauseous or bloated.  Likewise meals heavy in dairy and vegetables can cause extra gas to form and this can also increase the bloating.  Drink plenty of fluids but you should avoid alcoholic beverages for 24 hours.  ACTIVITY: Your care partner should take you home directly after the procedure.  You should plan to take it easy, moving slowly for the rest of the day.  You can resume normal activity the day after the procedure however you should NOT DRIVE or use heavy machinery for 24 hours  (because of the sedation medicines used during the test).    SYMPTOMS TO REPORT IMMEDIATELY: A gastroenterologist can be reached at any hour.  During normal business hours, 8:30 AM to 5:00 PM Monday through Friday, call [phone].  After hours and on weekends, please call the GI answering service at [phone] who will take a message and have the physician on call contact you.   Following lower endoscopy (colonoscopy or flexible sigmoidoscopy):  Excessive amounts of blood in the stool  Significant tenderness or worsening of abdominal pains  Swelling of the abdomen that is new, acute  Fever of 100F or higher  Following upper endoscopy (EGD)  Vomiting of blood or coffee ground material  New chest pain or pain under the shoulder blades  Painful or persistently difficult swallowing  New shortness of breath  Fever of 100F or higher  Black, tarry-looking stools  FOLLOW UP: If any biopsies were taken you will be contacted by phone or by letter within the next 1-3 weeks.  Call your gastroenterologist if you have not heard about the biopsies in 3 weeks.  Our staff will call the home number listed on your records the next business day following your procedure to check on you and address any questions or concerns that you may have at that time regarding the information given to you following your procedure. This is a courtesy call and so if there is  no answer at the home number and we have not heard from you through the emergency physician on call, we will assume that you have returned to your regular daily activities without incident.  SIGNATURES/CONFIDENTIALITY: You and/or your care partner have signed paperwork which will be entered into your electronic medical record.  These signatures attest to the fact that that the information above on your After Visit Summary has been reviewed and is understood.  Full responsibility of the confidentiality of this discharge information lies with you  and/or your care-partner.

## 2012-05-29 NOTE — Op Note (Signed)
[place]  [address] [place], [address]   POUCHOSCOPY PROCEDURE REPORT  PATIENT: Charles [name], Charles [name]  MR#: [id number] BIRTHDATE: 1970/12/09 , 41  yrs. old GENDER: Male ENDOSCOPIST: Gatha Mayer, MD, [facility]  PROCEDURE DATE:  05/29/2012 PROCEDURE:   Pouchoscopy with biopsy ASA CLASS:   Class II INDICATIONS:follow up for previously diagnosed Crohn's disease. MEDICATIONS: propofol (Diprivan) 219m IV, MAC sedation, administered by CRNA, and These medications were titrated to patient response per physician's verbal order  DESCRIPTION OF PROCEDURE:   After the risks benefits and alternatives of the procedure were thoroughly explained, informed consent was obtained.  revealed no abnormalities of the rectum. The [id number] endoscope was introduced through the anus and advanced to the ileum      , limited by No adverse events experienced.   The quality of the prep was excellent .  The instrument was then slowly withdrawn as the mucosa was fully examined.       FINDINGS: Non-bleeding mucosal ulceration, intermittent across the area examined, was present in the pouch.  Multiple biopsies were performed using cold forceps.  there was associated erythema also. Just in the distal 5 cm or less. The anastomosis looked normal as did 100 cm ileum proximal. Retroflexion was not performed.    The scope was then withdrawn from the patient and the procedure terminated.  COMPLICATIONS: There were no complications.  ENDOSCOPIC IMPRESSION: Non-bleeding mucosal ulceration in the pouch; multiple biopsies were performed using cold forceps Looks like pouchitis.  RECOMMENDATIONS: await biopsy results consider Remicade antibodies, topical therapy      eSigned:  CGatha Mayer MD, FA[facility] Division10/06/2012 4:01 PM  CGO:VPCHEKBAOuida Sills MD The Patient JDorena Cookey MD

## 2012-05-29 NOTE — Progress Notes (Signed)
The pt tolerated the flex sig very well. Maw

## 2012-06-01 ENCOUNTER — Other Ambulatory Visit: Payer: Self-pay | Admitting: Family Medicine

## 2012-06-01 ENCOUNTER — Telehealth: Payer: Self-pay | Admitting: *Deleted

## 2012-06-01 DIAGNOSIS — J329 Chronic sinusitis, unspecified: Secondary | ICD-10-CM

## 2012-06-01 MED ORDER — CEPHALEXIN 500 MG PO CAPS: ORAL_CAPSULE | ORAL | Status: DC

## 2012-06-01 NOTE — Telephone Encounter (Signed)
  Follow up Call-  Call back number 05/29/2012  Post procedure Call Back phone  # [phone]  Permission to leave phone message Yes     Patient questions:  Message left to call us if necessary.

## 2012-06-09 ENCOUNTER — Other Ambulatory Visit: Payer: Self-pay | Admitting: Internal Medicine

## 2012-06-09 ENCOUNTER — Telehealth: Payer: Self-pay | Admitting: Internal Medicine

## 2012-06-09 MED ORDER — MESALAMINE 1000 MG RE SUPP: 1000.0000 mg | Freq: Every day | RECTAL | Status: DC

## 2012-06-09 NOTE — Progress Notes (Signed)
Quick Note:  Results discussed with patient No recall or letter I sent him a copy of path  He will use Canasa suppositories prn and let me know if he wants a tertiary referral about pouchitis ______

## 2012-06-09 NOTE — Telephone Encounter (Signed)
Patient reports he just spoke with Dr. Carlean Purl and he gave him the path results.

## 2012-07-30 ENCOUNTER — Ambulatory Visit (INDEPENDENT_AMBULATORY_CARE_PROVIDER_SITE_OTHER)
Admission: RE | Admit: 2012-07-30 | Discharge: 2012-07-30 | Disposition: A | Discharge status: Home / Self Care | Payer: BC Managed Care – PPO | Source: Ambulatory Visit | Attending: Family Medicine | Admitting: Family Medicine

## 2012-07-30 ENCOUNTER — Other Ambulatory Visit: Payer: Self-pay | Admitting: Family Medicine

## 2012-07-30 DIAGNOSIS — J329 Chronic sinusitis, unspecified: Secondary | ICD-10-CM

## 2012-07-30 MED ORDER — NEOMYCIN-POLYMYXIN-HC 3.5-10000-1 OT SUSP: 3.0000 [drp] | Freq: Four times a day (QID) | OTIC | Status: DC

## 2012-07-30 MED ORDER — TRAMADOL HCL 50 MG PO TABS: ORAL_TABLET | ORAL | Status: DC

## 2012-08-20 ENCOUNTER — Other Ambulatory Visit: Payer: Self-pay | Admitting: Family Medicine

## 2012-08-20 DIAGNOSIS — J329 Chronic sinusitis, unspecified: Secondary | ICD-10-CM

## 2012-08-24 ENCOUNTER — Ambulatory Visit (INDEPENDENT_AMBULATORY_CARE_PROVIDER_SITE_OTHER)
Admission: RE | Admit: 2012-08-24 | Discharge: 2012-08-24 | Disposition: A | Discharge status: Home / Self Care | Payer: 59 | Source: Ambulatory Visit | Attending: Family Medicine | Admitting: Family Medicine

## 2012-08-24 DIAGNOSIS — J329 Chronic sinusitis, unspecified: Secondary | ICD-10-CM

## 2012-11-16 ENCOUNTER — Other Ambulatory Visit: Payer: Self-pay | Admitting: *Deleted

## 2012-11-16 MED ORDER — ELETRIPTAN HYDROBROMIDE 20 MG PO TABS: 20.0000 mg | ORAL_TABLET | ORAL | Status: DC | PRN

## 2013-01-21 ENCOUNTER — Other Ambulatory Visit: Payer: Self-pay | Admitting: *Deleted

## 2013-01-21 MED ORDER — ALBUTEROL SULFATE HFA 108 (90 BASE) MCG/ACT IN AERS: 2.0000 | INHALATION_SPRAY | Freq: Four times a day (QID) | RESPIRATORY_TRACT | Status: DC | PRN

## 2013-03-05 DIAGNOSIS — K469 Unspecified abdominal hernia without obstruction or gangrene: Secondary | ICD-10-CM | POA: Insufficient documentation

## 2013-04-28 ENCOUNTER — Other Ambulatory Visit: Payer: Self-pay | Admitting: Family Medicine

## 2013-06-08 ENCOUNTER — Ambulatory Visit (INDEPENDENT_AMBULATORY_CARE_PROVIDER_SITE_OTHER): Payer: 59 | Admitting: *Deleted

## 2013-06-08 DIAGNOSIS — Z23 Encounter for immunization: Secondary | ICD-10-CM

## 2013-06-08 DIAGNOSIS — Z0000 Encounter for general adult medical examination without abnormal findings: Secondary | ICD-10-CM

## 2013-06-11 DIAGNOSIS — J45909 Unspecified asthma, uncomplicated: Secondary | ICD-10-CM | POA: Insufficient documentation

## 2013-08-05 ENCOUNTER — Other Ambulatory Visit: Payer: Self-pay | Admitting: Dermatology

## 2013-09-21 ENCOUNTER — Ambulatory Visit (INDEPENDENT_AMBULATORY_CARE_PROVIDER_SITE_OTHER): Payer: 59 | Admitting: Internal Medicine

## 2013-09-21 ENCOUNTER — Other Ambulatory Visit: Payer: 59

## 2013-09-21 ENCOUNTER — Encounter: Payer: Self-pay | Admitting: Internal Medicine

## 2013-09-21 VITALS — BP 140/82 | HR 82 | Ht 69.0 in | Wt 152.8 lb

## 2013-09-21 DIAGNOSIS — K91850 Pouchitis: Secondary | ICD-10-CM

## 2013-09-21 DIAGNOSIS — K5080 Crohn's disease of both small and large intestine without complications: Secondary | ICD-10-CM

## 2013-09-21 NOTE — Progress Notes (Signed)
         Subjective:    Patient ID: Charles [name], male    DOB: August 17, 1971, 43 y.o.   MRN: [id number]  HPI The patient is here for followup, he has a history of Crohn's disease a large and small intestines with colectomy and ileoanal pull-through when originally thought to have ulcerative colitis. He has been in remission other than some endoscopic pouchitis changes for a long time now. He has been maintained on Remicade. At the end of 2014 he had surgery at [facility] with hernia repair plastic surgery to improve postoperative changes from prior surgeries. He tolerated that well. His Remicadedoubt for about 10 weeks to limit potential side effects or adverse reactions with healing et Ronney Asters. He seemed to do okay when he was off the Remicade he says though he became ill after December 22 infusion. He thinks he might have had the flu at that time actually not this earlier reaction to Remicade but he is unsure. He did have Remicade again later this month.  As far as his gastrointestinal symptoms these are stable, he is not bothered with bleeding or diarrhea other than his postoperative changes. He is not using Canasa suppository or a regular basis. He remains concerned about whether or not he really needs to continue Remicade for ever, and questions withdrawing that.  His wife was diagnosed and treated with breast cancer last year. She is a candidate for total hysterectomy because of genetic issues that have been uncovered. His mother-in-law passed away from ovarian cancer last year. 2014 was stressful, obviously.  Review of Systems As above    Objective:   Physical Exam Thin but well-developed well-nourished no acute distress Abdomen shows nicely healing scars, some palpable suture material under the skin, nontender.    Assessment & Plan:   1. CROHN'S DISEASE, LARGE AND SMALL INTESTINES   2. Pouchitis    copy Sabino Gasser.D.

## 2013-09-21 NOTE — Assessment & Plan Note (Addendum)
OK at this time. He raises a seems about need to continue Remicade. I have explained it is unclear to me. I think starting with infliximab antibodies make sense. If those are high withdrawing Remicade and observing is not unreasonable though he could have a flare or recurrence of active disease. May need tertiary evaluation.

## 2013-09-21 NOTE — Patient Instructions (Addendum)
Go to the basement for labs today  Follow up in one year

## 2013-09-21 NOTE — Assessment & Plan Note (Signed)
Chronic issue - not symptomatic - seen on endoscopy

## 2013-09-24 ENCOUNTER — Encounter: Payer: Self-pay | Admitting: Internal Medicine

## 2013-09-24 ENCOUNTER — Telehealth: Payer: Self-pay | Admitting: Internal Medicine

## 2013-09-24 NOTE — Telephone Encounter (Signed)
Please refer him to Dr. Jeanne Ivan re: hx of Crohn's - s/p colectomy, advise re: long-term treatment I will send a letter once we get appt date Need to go way back to my first notes with him to get his old hx ----- Message ----- From: Kellie Moor, RN Sent: 09/24/2013 12:03 PM To: Gatha Mayer, MD Subject: FW: Visit Follow-Up Question    See my chart messages from today for further details.   Referral to Dr. Emelda Fear will be initiated

## 2013-09-27 LAB — INFLIXIMAB+AB (SERIAL MONITOR)
Anti-Infliximab Antibody: 45 ng/mL — ABNORMAL HIGH
Infliximab Drug Level: 15 ug/mL

## 2013-09-27 NOTE — Telephone Encounter (Signed)
Dr. Milas Hock does not have a specialty referral form.  Can you please do your summary letter and I will fax it up so we can get an appt set up with Dr. Emelda Fear.  I put his records in your office if you need them

## 2013-09-27 NOTE — Progress Notes (Signed)
Quick Note:  Let him know that he does have antibodies to remicade which suggests it is less effective or not working perhaps. Cc Dr. Ouida Sills (rheumatology)  Has he gotten a date to see Dr. Emelda Fear yet?   ______

## 2013-09-27 NOTE — Telephone Encounter (Signed)
OK will do that - disregard my ? On the infliximab Ab result

## 2013-09-30 ENCOUNTER — Encounter: Payer: Self-pay | Admitting: Internal Medicine

## 2013-10-06 NOTE — Telephone Encounter (Signed)
Per Dr. Carlean Purl will hold off on referral to Dr. Emelda Fear for now. Dr. Carlean Purl will discuss again with Dr. Ouida Sills prior to any referrals.

## 2013-10-13 ENCOUNTER — Encounter: Payer: Self-pay | Admitting: Internal Medicine

## 2013-11-09 ENCOUNTER — Telehealth: Payer: Self-pay | Admitting: Family Medicine

## 2013-11-09 MED ORDER — LORAZEPAM 1 MG PO TABS: ORAL_TABLET | ORAL | Status: DC

## 2013-11-09 NOTE — Telephone Encounter (Signed)
[facility] [place], [address] is requesting re-fill onLORazepam (ATIVAN) 1 MG tablet

## 2013-11-09 NOTE — Telephone Encounter (Signed)
Patient was scheduled for Dr. Emelda Fear for 12/27/13 8:00.  Patient is aware

## 2013-11-09 NOTE — Telephone Encounter (Signed)
Rx called in 

## 2013-12-27 DIAGNOSIS — K5090 Crohn's disease, unspecified, without complications: Secondary | ICD-10-CM | POA: Insufficient documentation

## 2013-12-27 DIAGNOSIS — K439 Ventral hernia without obstruction or gangrene: Secondary | ICD-10-CM | POA: Insufficient documentation

## 2013-12-30 ENCOUNTER — Telehealth: Payer: Self-pay | Admitting: Internal Medicine

## 2013-12-30 NOTE — Telephone Encounter (Signed)
Yes - I have seen the notes from Dr. Emelda Fear

## 2013-12-30 NOTE — Telephone Encounter (Signed)
Patient notified.  He is scheduled for flex 02/17/14 and pre-visit 6/26

## 2013-12-30 NOTE — Telephone Encounter (Signed)
Ok to set up flex??

## 2014-01-18 ENCOUNTER — Ambulatory Visit (INDEPENDENT_AMBULATORY_CARE_PROVIDER_SITE_OTHER): Payer: 59 | Admitting: Family Medicine

## 2014-01-18 ENCOUNTER — Other Ambulatory Visit (INDEPENDENT_AMBULATORY_CARE_PROVIDER_SITE_OTHER): Payer: 59

## 2014-01-18 ENCOUNTER — Encounter: Payer: Self-pay | Admitting: Family Medicine

## 2014-01-18 VITALS — BP 132/82 | HR 58 | Ht 71.0 in | Wt 148.0 lb

## 2014-01-18 DIAGNOSIS — M25579 Pain in unspecified ankle and joints of unspecified foot: Secondary | ICD-10-CM

## 2014-01-18 DIAGNOSIS — M25571 Pain in right ankle and joints of right foot: Secondary | ICD-10-CM

## 2014-01-18 DIAGNOSIS — G5761 Lesion of plantar nerve, right lower limb: Secondary | ICD-10-CM | POA: Insufficient documentation

## 2014-01-18 DIAGNOSIS — G5760 Lesion of plantar nerve, unspecified lower limb: Secondary | ICD-10-CM

## 2014-01-18 MED ORDER — DICLOFENAC SODIUM 2 % TD SOLN: 2.0000 "application " | Freq: Two times a day (BID) | TRANSDERMAL | Status: DC

## 2014-01-18 NOTE — Patient Instructions (Addendum)
Good to meet you Ice bath 20 minutes 2 times daily.  Pennsaid topically 2 times daily Vitamin D 2000 IU daily.  B6 123m daily.  Spenco orthotics sport insoles online or omega sports.  Exercises 3 times a week.  After running 4:1 ratio of carbs to protein  AKA chocolate milk, whey protein isolate.  Come back in 3 weeks.

## 2014-01-18 NOTE — Assessment & Plan Note (Addendum)
Patient does likely have the pain in the numbness of the foot secondary to this Morton's neuroma. Patient was given an injection today. We discussed topical anti-inflammatory. We will avoid oral anti-inflammatories [place]ica patient's past medical history. Patient will do icing protocol and we discussed over-the-counter orthotics and compression that could be beneficial. Patient is a try these interventions and come back again in 3 weeks. Patient continues to have pain I think he would be a candidate for custom orthotics.

## 2014-01-18 NOTE — Progress Notes (Signed)
[name] [address], [place] [address] Phone: [phone] Subjective:    I'm seeing this patient by the request  of:  TODD,JEFFREY ALLEN, MD   CC:  Right foot and ankle pain  WNU:[id number] Charles [name] is a 43 y.o. male coming in with complaint of right foot and ankle pain. Patient actually injured his left ankle back in October. Patient states that seem to resolve the when he started trying to run again over the course of the last several months he has started noticing more right-sided pain. Patient states after running approximately 4 miles he gets numbness in the anterior aspect of the foot and then has some cramping sensations or radiation of the numbness going up the posterior aspect of his ankle. Patient does not remember any true injury. Patient denies any discoloration or any swelling. Patient states most of the numbness seems to be more on the lateral aspect of the foot. Patient is an avid runner runs approximately half a marathon at least a month. Patient states that he does not have significant pain at baseline. Patient denies any pain with just regular walking. Patient is have a history of plantar fasciitis on this foot before that does get better with wearing specific shoes.     Past medical history, social, surgical and family history all reviewed in electronic medical record.   Review of Systems: No headache, visual changes, nausea, vomiting, diarrhea, constipation, dizziness, abdominal pain, skin rash, fevers, chills, night sweats, weight loss, swollen lymph nodes, body aches, joint swelling, muscle aches, chest pain, shortness of breath, mood changes.   Objective Blood pressure 132/82, pulse 58, height 5' 11"  (1.803 m), weight 148 lb (67.132 kg), SpO2 97.00%.  General: No apparent distress alert and oriented x3 mood and affect normal, dressed appropriately.  HEENT: Pupils equal, extraocular movements intact  Respiratory: Patient's  speak in full sentences and does not appear short of breath  Cardiovascular: No lower extremity edema, non tender, no erythema  Skin: Warm dry intact with no signs of infection or rash on extremities or on axial skeleton.  Abdomen: Soft nontender  Neuro: Cranial nerves II through XII are intact, neurovascularly intact in all extremities with 2+ DTRs and 2+ pulses.  Lymph: No lymphadenopathy of posterior or anterior cervical chain or axillae bilaterally.  Gait normal with good balance and coordination.  MSK:  Non tender with full range of motion and good stability and symmetric strength and tone of shoulders, elbows, wrist, hip, knees bilaterally.  Ankle: Right No visible erythema or swelling. Range of motion is full in all directions. Strength is 5/5 in all directions. Stable lateral and medial ligaments; squeeze test and kleiger test unremarkable; Talar dome mild tenderness No pain at base of 5th MT; No tenderness over cuboid; No tenderness over N spot or navicular prominence No tenderness on posterior aspects of lateral and medial malleolus No sign of peroneal tendon subluxations or tenderness to palpation Negative tarsal tunnel tinel's Able to walk 4 steps. The patient does have tenderness to palpation between the fourth and fifth metatarsal heads. Contralateral ankle does have what appears to be a ganglion cyst over the lateral malleolus. Otherwise exam is unremarkable.  Exam this is a patient does have collection of the transverse arch bilaterally. Patient does have a neutral hindfoot to. Patient does have a hypertrophy of the hallux flexor muscle.  MSK US performed of: *Right This study was ordered, performed, and interpreted by Charlann Boxer D.O.  Foot/Ankle:  All structures visualized.   Talar dome unremarkable  Ankle mortise with minimal effusion. Patient does have an overlapping varicose vein that does cause some mild compression of the dorsal nerve. Peroneus longus and brevis  tendons unremarkable on long and transverse views without sheath effusions. Posterior tibialis, flexor hallucis longus, and flexor digitorum longus tendons unremarkable on long and transverse views without sheath effusions. Achilles tendon visualized along length of tendon and unremarkable on long and transverse views without sheath effusion. Anterior Talofibular Ligament and Calcaneofibular Ligaments unremarkable and intact. Deltoid Ligament unremarkable and intact. Plantar fascia intact and without effusion, normal thickness. No increased doppler signal, cap sign, or thickening of tibial cortex. Power doppler signal normal. Neuroma noted between the fourth and fifth metatarsal heads.  IMPRESSION:  Morton's neuroma  After verbal consent patient was prepped with alcohol swabs and with a 25-gauge 1 inch needle was injected with 0.5 cc of 0.5% Marcaine and 0.5 cc of Kenalog 40 mg/dL. This was done under ultrasound guidance. Patient tolerated the procedure well with good pain relief immediately. Postinjection instructions given.      Impression and Recommendations:     This case required medical decision making of moderate complexity.

## 2014-02-08 ENCOUNTER — Ambulatory Visit (INDEPENDENT_AMBULATORY_CARE_PROVIDER_SITE_OTHER): Payer: 59 | Admitting: Family Medicine

## 2014-02-08 ENCOUNTER — Encounter: Payer: Self-pay | Admitting: Family Medicine

## 2014-02-08 VITALS — BP 140/80 | HR 95 | Ht 71.0 in | Wt 151.0 lb

## 2014-02-08 DIAGNOSIS — G5761 Lesion of plantar nerve, right lower limb: Secondary | ICD-10-CM

## 2014-02-08 DIAGNOSIS — G5760 Lesion of plantar nerve, unspecified lower limb: Secondary | ICD-10-CM

## 2014-02-08 NOTE — Assessment & Plan Note (Signed)
Patient is doing significantly better at this time. Patient is able to do all activities of daily living and is able to run. Patient will start increasing his activities even more if he has any significant discomfort we may want to consider custom orthotics. Patient will follow up on an as-needed basis .

## 2014-02-08 NOTE — Patient Instructions (Signed)
It is good  To see you Do what you want at this time Continue the braces and always have orthotics with running Ice after running would be a good idea and use topical as needed See me when you need me.

## 2014-02-08 NOTE — Progress Notes (Signed)
  [name] [name], [place] [address] Phone: [phone] Subjective:     CC:  Right foot and ankle pain followup  [id number] Charles [name] is a 43 y.o. male coming in with complaint of right foot and ankle pain. 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 Patient was given braces over-the-counter that he states has significantly decreased the pain in his ankles. In addition a that patient's Morton's neuroma after injection has completely resolved. Patient has been able to run without any significant discomfort. Patient is able to do all daily activities without any trouble. We did not do any anti-inflammatories secondary to patient's history of Crohn's disease.  Patient denies using the topical anti-inflammatories and states that this is helpful as well.     Past medical history, social, surgical and family history all reviewed in electronic medical record.   Review of Systems: No headache, visual changes, nausea, vomiting, diarrhea, constipation, dizziness, abdominal pain, skin rash, fevers, chills, night sweats, weight loss, swollen lymph nodes, body aches, joint swelling, muscle aches, chest pain, shortness of breath, mood changes.   Objective Blood pressure 140/80, pulse 95, height 5' 11"  (1.803 m), weight 151 lb (68.493 kg), SpO2 99.00%.  General: No apparent distress alert and oriented x3 mood and affect normal, dressed appropriately.  HEENT: Pupils equal, extraocular movements intact  Respiratory: Patient's speak in full sentences and does not appear short of breath  Cardiovascular: No lower extremity edema, non tender, no erythema  Skin: Warm dry intact with no signs of infection or rash on extremities or on axial skeleton.  Abdomen: Soft nontender  Neuro: Cranial nerves II through XII are intact, neurovascularly intact in all extremities with 2+ DTRs  and 2+ pulses.  Lymph: No lymphadenopathy of posterior or anterior cervical chain or axillae bilaterally.  Gait normal with good balance and coordination.  MSK:  Non tender with full range of motion and good stability and symmetric strength and tone of shoulders, elbows, wrist, hip, knees bilaterally.  Ankle: Right No visible erythema or swelling. Range of motion is full in all directions. Strength is 5/5 in all directions. Stable lateral and medial ligaments; squeeze test and kleiger test unremarkable; Talar dome nontender No pain at base of 5th MT; No tenderness over cuboid; No tenderness over N spot or navicular prominence No tenderness on posterior aspects of lateral and medial malleolus No sign of peroneal tendon subluxations or tenderness to palpation Negative tarsal tunnel tinel's Able to walk 4 steps. No tenderness between the metatarsal heads.   Exam this is a patient does have collection of the transverse arch bilaterally. Patient does have a neutral hindfoot to. Patient does have a hypertrophy of the hallux flexor muscle.     Impression and Recommendations:     This case required medical decision making of moderate complexity.

## 2014-02-11 ENCOUNTER — Ambulatory Visit (AMBULATORY_SURGERY_CENTER): Payer: Self-pay

## 2014-02-11 VITALS — Ht 70.0 in | Wt 149.0 lb

## 2014-02-11 DIAGNOSIS — K5090 Crohn's disease, unspecified, without complications: Secondary | ICD-10-CM

## 2014-02-11 DIAGNOSIS — K50919 Crohn's disease, unspecified, with unspecified complications: Secondary | ICD-10-CM

## 2014-02-11 NOTE — Progress Notes (Signed)
No allergies to eggs or soy No home oxygen No past problems with anesthesia No diet/weight loss meds  Has email  Emmi instructions given for colonoscopy

## 2014-02-14 ENCOUNTER — Encounter: Payer: Self-pay | Admitting: Family Medicine

## 2014-02-14 ENCOUNTER — Other Ambulatory Visit: Payer: Self-pay | Admitting: *Deleted

## 2014-02-14 ENCOUNTER — Telehealth: Payer: Self-pay | Admitting: Family Medicine

## 2014-02-14 MED ORDER — DEXLANSOPRAZOLE 30 MG PO CPDR
30.0000 mg | DELAYED_RELEASE_CAPSULE | Freq: Every day | ORAL | Status: DC
Start: 2014-02-14 — End: 2014-02-17

## 2014-02-14 MED ORDER — LORAZEPAM 1 MG PO TABS: ORAL_TABLET | ORAL | Status: DC

## 2014-02-14 MED ORDER — MOMETASONE FURO-FORMOTEROL FUM 100-5 MCG/ACT IN AERO: 2.0000 | INHALATION_SPRAY | Freq: Every day | RESPIRATORY_TRACT | Status: DC

## 2014-02-14 NOTE — Telephone Encounter (Signed)
Charles [name] is a 43 year old married male nonsmoker who comes in today for a review his medications  He takes dulera 2 puffs daily for chronic asthma  He takes Relpax 2-3 times yearly for migraine headaches  He was given dixelent 60 mg daily by Dr. Carmelina Peal his allergist when he had a chronic cough. They suspected a chronic cough was indeed reflux. Indeed this medication has helped. He's been on it for about 6 months plus.... he wants to now if he can you to come off of it or  decrease the dose. We discussed various options. He's going to try to take 60 mg Monday Wednesday Friday or take 30 mg daily.  He has sleep dysfunction manifested by waking up in the middle of the night  and he can go back to sleep. He's taken Ativan 1 mg dose one half tab each bedtime when necessary and this helps to sleep dysfunction. We talked about other options however since this works and he has no side effects we've elected to continue that treatment option at this time.  The Qvar has been discontinued  He does not use the albuterol. He runs and cycles and has no difficulty breathing.

## 2014-02-15 ENCOUNTER — Telehealth: Payer: Self-pay | Admitting: Family Medicine

## 2014-02-15 MED ORDER — MOMETASONE FURO-FORMOTEROL FUM 100-5 MCG/ACT IN AERO: 2.0000 | INHALATION_SPRAY | Freq: Every day | RESPIRATORY_TRACT | Status: DC

## 2014-02-15 NOTE — Telephone Encounter (Signed)
Error

## 2014-02-15 NOTE — Telephone Encounter (Signed)
[facility] needs verification on the directions for rx mometasone-formoterol (DULERA) 100-5 MCG/ACT AERO

## 2014-02-17 ENCOUNTER — Encounter: Payer: Self-pay | Admitting: Internal Medicine

## 2014-02-17 ENCOUNTER — Ambulatory Visit (AMBULATORY_SURGERY_CENTER): Payer: 59 | Admitting: Internal Medicine

## 2014-02-17 VITALS — BP 124/79 | HR 55 | Temp 98.6°F | Resp 13 | Ht 70.0 in | Wt 149.0 lb

## 2014-02-17 DIAGNOSIS — K5289 Other specified noninfective gastroenteritis and colitis: Secondary | ICD-10-CM

## 2014-02-17 DIAGNOSIS — K5090 Crohn's disease, unspecified, without complications: Secondary | ICD-10-CM

## 2014-02-17 MED ORDER — SODIUM CHLORIDE 0.9 % IV SOLN: 500.0000 mL | INTRAVENOUS | Status: DC

## 2014-02-17 NOTE — Op Note (Signed)
[place]  [address], [id number]   FLEXIBLE SIGMOIDOSCOPY PROCEDURE REPORT  PATIENT: Charles [name], [place]  MR#: [id number] BIRTHDATE: May 29, 1971 , 43  yrs. old GENDER: Male ENDOSCOPIST: Gatha Mayer, MD, [facility] PROCEDURE DATE:  02/17/2014 PROCEDURE:   Sigmoidoscopy with biopsy ASA CLASS:   Class II INDICATIONS:follow-up ?pouchitis in Crohn' s/p ileo-anal pull through. MEDICATIONS: propofol (Diprivan) 271m IV, MAC sedation, administered by CRNA, and These medications were titrated to patient response per physician's verbal order  DESCRIPTION OF PROCEDURE:   After the risks benefits and alternatives of the procedure were thoroughly explained, informed consent was obtained.  revealed no abnormalities of the pouch. The LB [id number]-H190 [id number] endoscope was introduced through the anus  and advanced to the ileum      , limited by No adverse events experienced.   The quality of the prep was excellent .  The instrument was then slowly withdrawn as the mucosa was fully examined.      COLON FINDINGS: 1) Two small ulcers in distal pouch - biopsied 2) Pouch and ileum up to 100 cm intubation normal. Retroflexed views revealed no abnormalities and Retroflexion was not performed due to a narrow rectal vault.    The scope was then withdrawn from the patient and the procedure terminated.  COMPLICATIONS: There were no complications.  ENDOSCOPIC IMPRESSION: 1) Two small ulcers in distal pouch - biopsied 2) Pouch and ileum up to 100 cm intubation normal  RECOMMENDATIONS: await biopsy results - will call    eSigned:  CGatha Mayer MD, [facility]07/09/2013 11:46 AM   CC:The Patient and JJeanne Ivan MD

## 2014-02-17 NOTE — Patient Instructions (Addendum)
There were two small ulcers in the end of the pouch. I took biopsies. Otherwise looked great. This is better than 2013.  Will call with results.  I appreciate the opportunity to care for you. Gatha Mayer, MD, FACG  YOU HAD AN ENDOSCOPIC PROCEDURE TODAY AT [facility]: Refer to the procedure report that was given to you for any specific questions about what was found during the examination.  If the procedure report does not answer your questions, please call your gastroenterologist to clarify.  If you requested that your care partner not be given the details of your procedure findings, then the procedure report has been included in a sealed envelope for you to review at your convenience later.  YOU SHOULD EXPECT: Some feelings of bloating in the abdomen. Passage of more gas than usual.  Walking can help get rid of the air that was put into your GI tract during the procedure and reduce the bloating. If you had a lower endoscopy (such as a colonoscopy or flexible sigmoidoscopy) you may notice spotting of blood in your stool or on the toilet paper. If you underwent a bowel prep for your procedure, then you may not have a normal bowel movement for a few days.  DIET: Your first meal following the procedure should be a light meal and then it is ok to progress to your normal diet.  A half-sandwich or bowl of soup is an example of a good first meal.  Heavy or fried foods are harder to digest and may make you feel nauseous or bloated.  Likewise meals heavy in dairy and vegetables can cause extra gas to form and this can also increase the bloating.  Drink plenty of fluids but you should avoid alcoholic beverages for 24 hours.  ACTIVITY: Your care partner should take you home directly after the procedure.  You should plan to take it easy, moving slowly for the rest of the day.  You can resume normal activity the day after the procedure however you should NOT DRIVE or use heavy machinery for 24  hours (because of the sedation medicines used during the test).    SYMPTOMS TO REPORT IMMEDIATELY: A gastroenterologist can be reached at any hour.  During normal business hours, 8:30 AM to 5:00 PM Monday through Friday, call [phone].  After hours and on weekends, please call the GI answering service at [phone] who will take a message and have the physician on call contact you.   Following lower endoscopy (colonoscopy or flexible sigmoidoscopy):  Excessive amounts of blood in the stool  Significant tenderness or worsening of abdominal pains  Swelling of the abdomen that is new, acute  Fever of 100F or higher   FOLLOW UP: If any biopsies were taken you will be contacted by phone or by letter within the next 1-3 weeks.  Call your gastroenterologist if you have not heard about the biopsies in 3 weeks.  Our staff will call the home number listed on your records the next business day following your procedure to check on you and address any questions or concerns that you may have at that time regarding the information given to you following your procedure. This is a courtesy call and so if there is no answer at the home number and we have not heard from you through the emergency physician on call, we will assume that you have returned to your regular daily activities without incident.  SIGNATURES/CONFIDENTIALITY: You and/or your care partner  have signed paperwork which will be entered into your electronic medical record.  These signatures attest to the fact that that the information above on your After Visit Summary has been reviewed and is understood.  Full responsibility of the confidentiality of this discharge information lies with you and/or your care-partner.

## 2014-02-17 NOTE — Progress Notes (Signed)
Procedure ends, to recovery, report given and VSS. 

## 2014-02-17 NOTE — Progress Notes (Signed)
Called to room to assist during endoscopic procedure.  Patient ID and intended procedure confirmed with present staff. Received instructions for my participation in the procedure from the performing physician.  

## 2014-02-21 ENCOUNTER — Telehealth: Payer: Self-pay | Admitting: *Deleted

## 2014-02-21 NOTE — Telephone Encounter (Signed)
  Follow up Call-  Call back number 02/17/2014 05/29/2012  Post procedure Call Back phone  # [phone]  Permission to leave phone message Yes Yes     Patient questions:  Do you have a fever, pain , or abdominal swelling? No. Pain Score  0 *  Have you tolerated food without any problems? Yes.    Have you been able to return to your normal activities? Yes.    Do you have any questions about your discharge instructions: Diet   No. Medications  No. Follow up visit  No.  Do you have questions or concerns about your Care? No.  Actions: * If pain score is 4 or above: No action needed, pain <4.

## 2014-02-25 NOTE — Progress Notes (Signed)
Quick Note:  I called him and left message about results - I lean toward not taking Tx at this time  Please fax a copy of the report and the procedure report to Dr. Jeanne Ivan also   [place] - no letter or recall ______

## 2014-02-27 NOTE — Progress Notes (Signed)
Quick Note:  Minimal inflammation "colitis" in I-A pouch Seems to be doing well I would lean toward not treating right now but will see what Dr. Emelda Fear says  [place] no letter or recall - phone and [employer] notifiaction ______

## 2014-03-10 ENCOUNTER — Encounter: Payer: Self-pay | Admitting: Internal Medicine

## 2014-04-22 ENCOUNTER — Telehealth: Payer: Self-pay | Admitting: Family Medicine

## 2014-04-22 ENCOUNTER — Ambulatory Visit: Payer: 59 | Admitting: Family Medicine

## 2014-04-22 NOTE — Telephone Encounter (Signed)
Pt called and had to r/s his appointment for today 04/22/14, he stated he was out of town with work. He is r/s for 05/05/14

## 2014-04-22 NOTE — Telephone Encounter (Signed)
Noted  

## 2014-05-01 ENCOUNTER — Emergency Department (INDEPENDENT_AMBULATORY_CARE_PROVIDER_SITE_OTHER): Payer: 59

## 2014-05-01 ENCOUNTER — Emergency Department (HOSPITAL_COMMUNITY)
Admission: EM | Admit: 2014-05-01 | Discharge: 2014-05-01 | Disposition: A | Discharge status: Home / Self Care | Payer: 59 | Source: Home / Self Care | Attending: Emergency Medicine | Admitting: Emergency Medicine

## 2014-05-01 ENCOUNTER — Encounter (HOSPITAL_COMMUNITY): Payer: Self-pay | Admitting: Emergency Medicine

## 2014-05-01 DIAGNOSIS — Y9355 Activity, bike riding: Secondary | ICD-10-CM

## 2014-05-01 DIAGNOSIS — R296 Repeated falls: Secondary | ICD-10-CM

## 2014-05-01 DIAGNOSIS — S51009A Unspecified open wound of unspecified elbow, initial encounter: Secondary | ICD-10-CM

## 2014-05-01 DIAGNOSIS — S51012A Laceration without foreign body of left elbow, initial encounter: Secondary | ICD-10-CM

## 2014-05-01 MED ORDER — POVIDONE-IODINE 10 % EX SOLN
CUTANEOUS | Status: AC
  Filled 2014-05-01: qty 118

## 2014-05-01 MED ORDER — HYDROCODONE-ACETAMINOPHEN 5-325 MG PO TABS: 1.0000 | ORAL_TABLET | ORAL | Status: DC | PRN

## 2014-05-01 MED ORDER — LIDOCAINE HCL (PF) 2 % IJ SOLN
INTRAMUSCULAR | Status: AC
  Filled 2014-05-01: qty 2

## 2014-05-01 MED ORDER — CEPHALEXIN 500 MG PO CAPS: 500.0000 mg | ORAL_CAPSULE | Freq: Three times a day (TID) | ORAL | Status: DC
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
## 2014-05-01 NOTE — Discharge Instructions (Signed)
Laceration Care, Adult A laceration is a cut or lesion that goes through all layers of the skin and into the tissue just beneath the skin. TREATMENT  Some lacerations may not require closure. Some lacerations may not be able to be closed due to an increased risk of infection. It is important to see your caregiver as soon as possible after an injury to minimize the risk of infection and maximize the opportunity for successful closure. If closure is appropriate, pain medicines may be given, if needed. The wound will be cleaned to help prevent infection. Your caregiver will use stitches (sutures), staples, wound glue (adhesive), or skin adhesive strips to repair the laceration. These tools bring the skin edges together to allow for faster healing and a better cosmetic outcome. However, all wounds will heal with a scar. Once the wound has healed, scarring can be minimized by covering the wound with sunscreen during the day for 1 full year. HOME CARE INSTRUCTIONS  For sutures or staples:  Keep the wound clean and dry.  If you were given a bandage (dressing), you should change it at least once a day. Also, change the dressing if it becomes wet or dirty, or as directed by your caregiver.  Wash the wound with soap and water 2 times a day. Rinse the wound off with water to remove all soap. Pat the wound dry with a clean towel.  After cleaning, apply a thin layer of the antibiotic ointment as recommended by your caregiver. This will help prevent infection and keep the dressing from sticking.  You may shower as usual after the first 24 hours. Do not soak the wound in water until the sutures are removed.  Only take over-the-counter or prescription medicines for pain, discomfort, or fever as directed by your caregiver.  Get your sutures or staples removed as directed by your caregiver. For skin adhesive strips:  Keep the wound clean and dry.  Do not get the skin adhesive strips wet. You may bathe  carefully, using caution to keep the wound dry.  If the wound gets wet, pat it dry with a clean towel.  Skin adhesive strips will fall off on their own. You may trim the strips as the wound heals. Do not remove skin adhesive strips that are still stuck to the wound. They will fall off in time. For wound adhesive:  You may briefly wet your wound in the shower or bath. Do not soak or scrub the wound. Do not swim. Avoid periods of heavy perspiration until the skin adhesive has fallen off on its own. After showering or bathing, gently pat the wound dry with a clean towel.  Do not apply liquid medicine, cream medicine, or ointment medicine to your wound while the skin adhesive is in place. This may loosen the film before your wound is healed.  If a dressing is placed over the wound, be careful not to apply tape directly over the skin adhesive. This may cause the adhesive to be pulled off before the wound is healed.  Avoid prolonged exposure to sunlight or tanning lamps while the skin adhesive is in place. Exposure to ultraviolet light in the first year will darken the scar.  The skin adhesive will usually remain in place for 5 to 10 days, then naturally fall off the skin. Do not pick at the adhesive film. You may need a tetanus shot if:  You cannot remember when you had your last tetanus shot.  You have never had a tetanus  shot. If you get a tetanus shot, your arm may swell, get red, and feel warm to the touch. This is common and not a problem. If you need a tetanus shot and you choose not to have one, there is a rare chance of getting tetanus. Sickness from tetanus can be serious. SEEK MEDICAL CARE IF:   You have redness, swelling, or increasing pain in the wound.  You see a red line that goes away from the wound.  You have yellowish-white fluid (pus) coming from the wound.  You have a fever.  You notice a bad smell coming from the wound or dressing.  Your wound breaks open before or  after sutures have been removed.  You notice something coming out of the wound such as wood or glass.  Your wound is on your hand or foot and you cannot move a finger or toe. SEEK IMMEDIATE MEDICAL CARE IF:   Your pain is not controlled with prescribed medicine.  You have severe swelling around the wound causing pain and numbness or a change in color in your arm, hand, leg, or foot.  Your wound splits open and starts bleeding.  You have worsening numbness, weakness, or loss of function of any joint around or beyond the wound.  You develop painful lumps near the wound or on the skin anywhere on your body. MAKE SURE YOU:   Understand these instructions.  Will watch your condition.  Will get help right away if you are not doing well or get worse. Document Released: 08/05/2005 Document Revised: 10/28/2011 Document Reviewed: 01/29/2011 [facility] Patient Information 2015 [place], [place]. This information is not intended to replace advice given to you by your health care provider. Make sure you discuss any questions you have with your health care provider.  Sutured Wound Care Sutures are stitches that can be used to close wounds. Wound care helps prevent pain and infection.  HOME CARE INSTRUCTIONS   Rest and elevate the injured area until all the pain and swelling are gone.  Only take over-the-counter or prescription medicines for pain, discomfort, or fever as directed by your caregiver.  After 48 hours, gently wash the area with mild soap and water once a day, or as directed. Rinse off the soap. Pat the area dry with a clean towel. Do not rub the wound. This may cause bleeding.  Follow your caregiver's instructions for how often to change the bandage (dressing). Stop using a dressing after 2 days or after the wound stops draining.  If the dressing sticks, moisten it with soapy water and gently remove it.  Apply ointment on the wound as directed.  Avoid stretching a sutured  wound.  Drink enough fluids to keep your urine clear or pale yellow.  Follow up with your caregiver for suture removal as directed.  Use sunscreen on your wound for the next 3 to 6 months so the scar will not darken. SEEK IMMEDIATE MEDICAL CARE IF:   Your wound becomes red, swollen, hot, or tender.  You have increasing pain in the wound.  You have a red streak that extends from the wound.  There is pus coming from the wound.  You have a fever.  You have shaking chills.  There is a bad smell coming from the wound.  You have persistent bleeding from the wound. MAKE SURE YOU:   Understand these instructions.  Will watch your condition.  Will get help right away if you are not doing well or get worse. Document Released:  09/12/2004 Document Revised: 10/28/2011 Document Reviewed: 12/09/2010 [facility] Patient Information 2015 [place], [place]. This information is not intended to replace advice given to you by your health care provider. Make sure you discuss any questions you have with your health care provider.

## 2014-05-02 NOTE — ED Provider Notes (Signed)
Medical screening examination/treatment/procedure(s) were performed by non-physician practitioner and as supervising physician I was immediately available for consultation/collaboration.  Philipp Deputy, M.D.  Harden Mo, MD 05/02/14 [phone]

## 2014-05-05 ENCOUNTER — Encounter: Payer: Self-pay | Admitting: Family Medicine

## 2014-05-05 ENCOUNTER — Ambulatory Visit (INDEPENDENT_AMBULATORY_CARE_PROVIDER_SITE_OTHER): Payer: 59 | Admitting: Family Medicine

## 2014-05-05 VITALS — BP 124/82 | HR 79 | Ht 71.0 in | Wt 154.0 lb

## 2014-05-05 DIAGNOSIS — M654 Radial styloid tenosynovitis [de Quervain]: Secondary | ICD-10-CM | POA: Insufficient documentation

## 2014-05-05 DIAGNOSIS — G5761 Lesion of plantar nerve, right lower limb: Secondary | ICD-10-CM

## 2014-05-05 DIAGNOSIS — M461 Sacroiliitis, not elsewhere classified: Secondary | ICD-10-CM

## 2014-05-05 DIAGNOSIS — G5760 Lesion of plantar nerve, unspecified lower limb: Secondary | ICD-10-CM

## 2014-05-05 MED ORDER — DICLOFENAC SODIUM 2 % TD SOLN: TRANSDERMAL | Status: DC

## 2014-05-05 NOTE — Patient Instructions (Signed)
Good to see you ice bath for foot at the end of the day.  On step drop heels, then up on toes hold 2 seconds down slow for count of 4.  30 reps daily first week then 2 sets daily 2nd week then 3 sets daily thereafter,  Other exercises for your foot. And achilles.  Sacroiliac Joint Mobilization and Rehab 1. Work on pretzel stretching, shoulder back and leg draped in front. 3-5 sets, 30 sec.. 2. hip abductor rotations. standing, hip flexion and rotation outward then inward. 3 sets, 15 reps. when can do comfortably, add ankle weights starting at 2 pounds.  3. cross over stretching - shoulder back to ground, same side leg crossover. 3-5 sets for 30 min..  4. rolling up and back knees to chest and rocking. 5. sacral tilt - 5 sets, hold for 5-10 seconds [place] is coming to your house and use as needed Come back in 3 week and if foot is still giving you trouble will do an injection.

## 2014-05-05 NOTE — Assessment & Plan Note (Signed)
Discussed with patient at great length. Secondary to his Crohn's disease we will avoid oral anti-inflammatories the patient can try topical anti-inflammatories. Patient will revert back to ice bath at night. He showed patient properly lacing of the shoes that could be beneficial. Patient does not have complete remission I would like to do an injection again of the Morton's neuroma and we need to consider patient to be in custom orthotics.

## 2014-05-05 NOTE — Assessment & Plan Note (Signed)
Secondary to patient's fall and concern the patient may re\re exacerbated his sacroiliitis. We will monitor. Patient was given home exercises to increase range of motion so inflammation does not set in.
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
## 2014-05-05 NOTE — Assessment & Plan Note (Signed)
Patient's wrist secondary to de Quervain's tenosynovitis an overuse. I do not see any significant signs of early arthropathy. Patient given home exercise program and icing regimen and can do topical anti-inflammatories. Patient does not make any significant improvement we can consider a steroid injection within the tendon sheath. We will follow up with patient again in 3 weeks for further evaluation.

## 2014-05-30 ENCOUNTER — Ambulatory Visit (INDEPENDENT_AMBULATORY_CARE_PROVIDER_SITE_OTHER): Payer: 59 | Admitting: *Deleted

## 2014-05-30 DIAGNOSIS — Z23 Encounter for immunization: Secondary | ICD-10-CM

## 2014-05-30 DIAGNOSIS — Z111 Encounter for screening for respiratory tuberculosis: Secondary | ICD-10-CM

## 2014-06-02 DIAGNOSIS — Z23 Encounter for immunization: Secondary | ICD-10-CM

## 2014-06-02 NOTE — Progress Notes (Signed)
Per Dr Sherren Mocha ppd was negative

## 2014-06-19 HISTORY — PX: VENTRAL HERNIA REPAIR: SHX424

## 2014-06-30 ENCOUNTER — Ambulatory Visit: Payer: 59 | Admitting: *Deleted

## 2014-06-30 ENCOUNTER — Other Ambulatory Visit: Payer: Self-pay | Admitting: Family Medicine

## 2014-06-30 ENCOUNTER — Ambulatory Visit (INDEPENDENT_AMBULATORY_CARE_PROVIDER_SITE_OTHER): Payer: 59 | Admitting: Family Medicine

## 2014-06-30 DIAGNOSIS — D225 Melanocytic nevi of trunk: Secondary | ICD-10-CM

## 2014-06-30 DIAGNOSIS — D229 Melanocytic nevi, unspecified: Secondary | ICD-10-CM

## 2014-06-30 NOTE — Progress Notes (Signed)
   Subjective:    Patient ID: Charles [name], male    DOB: 1971-04-18, 43 y.o.   MRN: [id number]  HPI Mat is a 43 year old married male nonsmoker who comes in today for removal of 2 lesions Lesion #1 is an 8 mm 8 mm elevated lesion left anterior upper chest wall. Its recently been irritated and itching.  He has a history of dysplastic nevi  Second lesion is 6 M&Ms by 6 mm's left inner thigh. This lesion is a new lesion has dark pigmented.  After informed consent both lesions were anesthetized with 1% Xylocaine with epinephrine and removed with 2 mm to 3 mm margins. The bases were cauterized Band-Aid were applied the lesions were sent for pathologic analysis.   Review of Systems    review of systems negative Objective:   Physical Exam  Procedure see above      Assessment & Plan:  Dysplastic nevi clinically 2.............. Path pending

## 2014-06-30 NOTE — Addendum Note (Signed)
Addended by: Westley Hummer B on: 06/30/2014 05:24 PM   Modules accepted: Orders

## 2014-07-05 ENCOUNTER — Ambulatory Visit (INDEPENDENT_AMBULATORY_CARE_PROVIDER_SITE_OTHER): Payer: 59 | Admitting: Family Medicine

## 2014-07-05 ENCOUNTER — Encounter: Payer: Self-pay | Admitting: Family Medicine

## 2014-07-05 ENCOUNTER — Other Ambulatory Visit (INDEPENDENT_AMBULATORY_CARE_PROVIDER_SITE_OTHER): Payer: 59

## 2014-07-05 VITALS — BP 106/68 | HR 70 | Ht 71.0 in | Wt 159.0 lb

## 2014-07-05 DIAGNOSIS — G5761 Lesion of plantar nerve, right lower limb: Secondary | ICD-10-CM

## 2014-07-05 DIAGNOSIS — M6788 Other specified disorders of synovium and tendon, other site: Secondary | ICD-10-CM | POA: Insufficient documentation

## 2014-07-05 DIAGNOSIS — M7661 Achilles tendinitis, right leg: Secondary | ICD-10-CM

## 2014-07-05 NOTE — Assessment & Plan Note (Signed)
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 On ultrasound today patient was found to have what appeared to be a small avulsion fracture that seemed to be chronic in nature. This is sitting behind the posterior aspect of the medial malleolus within the groove. It does not appear to be impinging on the Achilles itself. No significant hypoechoic changes or tearing noted. Patient given home exercises and we discussed proper shoe wear. Patient is going to avoid negative heel drop at this time. Due to patient's chronicity of his problems I do think he would be a candidate for orthotics and we'll discuss this at follow-up.

## 2014-07-05 NOTE — Patient Instructions (Addendum)
Good to see you On stair, drop heels as far as you can up on toes then hold 2 seconds and down slow for count of 4 seconds, repeat 30 reps daily first week then 2 sets daily for 1 weeks and 3 sets daily thereafter.  Ice bath 20 minutes at night.  Running only 1 time a week.  Change to neutral shoe with 0 degree drop See me again in 3 weeks and we will make you orthotics.

## 2014-07-05 NOTE — Assessment & Plan Note (Addendum)
Patient was given an injection today with near complete resolution of pain. I think the rest of patient's ankle pain is likely secondary to him compensating. Injected again. Ice will help Will need custom orthotics likely and will re-evaluate at follow up.

## 2014-07-05 NOTE — Progress Notes (Signed)
  [name] [address] [place], [place] [address] Phone: [phone] Subjective:    CC:  recurrent foot pain  [id number] Charles [name] is a 43 y.o. male coming in recurrent foot pain.  Patient has had a Morton's neuroma and did have injection long time ago areas patient states that it is giving him more difficulty. Patient has been running differently and this is cause some more posterior heel pain as well.patient has been training for a triathlon and has noticed some increasing pain. Patient states that the pain in his foot has increased significantly. Patient does have a known Morton's neuroma. Patient did respond previously to an injection and last injection was greater than 5 months ago. Patient has not changed shoes and is not doing the exercises on a regular basis. Patient also states that he is having pain mostly over the Achilles at its insertion. Denies any swelling. Denies any redness. Does not remember any true injury. States though that this pain is very uncomfortable.      Past medical history, social, surgical and family history all reviewed in electronic medical record.   Review of Systems: No headache, visual changes, nausea, vomiting, diarrhea, constipation, dizziness, abdominal pain, skin rash, fevers, chills, night sweats, weight loss, swollen lymph nodes, body aches, joint swelling, muscle aches, chest pain, shortness of breath, mood changes.   Objective Blood pressure 106/68, pulse 70, height 5' 11"  (1.803 m), weight 159 lb (72.122 kg), SpO2 98 %.  General: No apparent distress alert and oriented x3 mood and affect normal, dressed appropriately.  HEENT: Pupils equal, extraocular movements intact  Respiratory: Patient's speak in full sentences and does not appear short of breath  Cardiovascular: No lower extremity edema, non tender, no erythema  Skin: Warm dry intact with no signs of infection or rash on extremities or on axial skeleton.    Abdomen: Soft nontender  Neuro: Cranial nerves II through XII are intact, neurovascularly intact in all extremities with 2+ DTRs and 2+ pulses.  Lymph: No lymphadenopathy of posterior or anterior cervical chain or axillae bilaterally.  Gait normal with good balance and coordination.  MSK:  Non tender with full range of motion and good stability and symmetric strength and tone of shoulders, elbows,  hip, knee and ankles bilaterally.    Foot exam shows the patient does have redo the transverse arch bilaterally. Patient does have some hypertrophy of the hallux flexor muscle. Otherwise fairly unremarkable. Patient is minimally tender to palpation between the third and fourth and fourth and fifth metatarsal heads.    After verbal consent patient was prepped with alcohol swabs and with a 25-gauge 1 inch needle was injected with 0.5 cc of 0.5% Marcaine and 0.5 cc of Kenalog 40 mg/dL. This was done under ultrasound guidance. Patient tolerated the procedure well with good pain relief immediately. Postinjection instructions given Impression and Recommendations:     This case required medical decision making of moderate complexity.

## 2014-07-26 ENCOUNTER — Ambulatory Visit (INDEPENDENT_AMBULATORY_CARE_PROVIDER_SITE_OTHER): Payer: 59 | Admitting: Family Medicine

## 2014-07-26 ENCOUNTER — Encounter: Payer: Self-pay | Admitting: Family Medicine

## 2014-07-26 VITALS — BP 122/84 | HR 62 | Ht 71.0 in | Wt 153.0 lb

## 2014-07-26 DIAGNOSIS — K5080 Crohn's disease of both small and large intestine without complications: Secondary | ICD-10-CM

## 2014-07-26 DIAGNOSIS — M216X9 Other acquired deformities of unspecified foot: Secondary | ICD-10-CM | POA: Insufficient documentation

## 2014-07-26 DIAGNOSIS — M76899 Other specified enthesopathies of unspecified lower limb, excluding foot: Secondary | ICD-10-CM

## 2014-07-26 DIAGNOSIS — M216X1 Other acquired deformities of right foot: Secondary | ICD-10-CM

## 2014-07-26 MED ORDER — PREDNISONE 50 MG PO TABS: 50.0000 mg | ORAL_TABLET | Freq: Every day | ORAL | Status: DC

## 2014-07-26 NOTE — Assessment & Plan Note (Signed)
Prednisone prescribed today with first clearing greater than one year.

## 2014-07-26 NOTE — Patient Instructions (Addendum)
Good to see you.  Prednisone burst could be considered. 53m daily for 5 days. Sorry Ice bath at the end of the day Exercises on wall.  Heel and butt touching.  Raise leg 6 inches and hold 2 seconds.  Down slow for count of 4 seconds.  1 set of 30 reps daily on both sides.  Drink well  Pick up orthotics on Thursday  See me again in 3 weeks or so.

## 2014-07-26 NOTE — Progress Notes (Signed)
  [name] [address] [place], [place] [address] Phone: [phone] Subjective:    CC:  recurrent foot pain  [id number] Charles [name] is a 43 y.o. male coming in recurrent foot pain.  Patient has had an Morton's neuroma injections previously and has been doing somewhat better. Patient continues to have fatigue of his ankles. Patient does have a past medical history significant for ulcerative colitis and has not had a flare for quite some time but is having increasing stool frequency and urgency. Patient states that this is making most of his body hurts him more. Discussed that he is going to have a half marathon this weekend and feels he is fatigue and not going to have a good time.      Past medical history, social, surgical and family history all reviewed in electronic medical record.   Review of Systems: No headache, visual changes, nausea, vomiting, diarrhea, constipation, dizziness, abdominal pain, skin rash, fevers, chills, night sweats, weight loss, swollen lymph nodes, body aches, joint swelling, muscle aches, chest pain, shortness of breath, mood changes.   Objective Blood pressure 122/84, pulse 62, height 5' 11"  (1.803 m), weight 153 lb (69.4 kg).  General: No apparent distress alert and oriented x3 mood and affect normal, dressed appropriately.  HEENT: Pupils equal, extraocular movements intact  Respiratory: Patient's speak in full sentences and does not appear short of breath  Cardiovascular: No lower extremity edema, non tender, no erythema  Skin: Warm dry intact with no signs of infection or rash on extremities or on axial skeleton.  Abdomen: Soft nontender  Neuro: Cranial nerves II through XII are intact, neurovascularly intact in all extremities with 2+ DTRs and 2+ pulses.  Lymph: No lymphadenopathy of posterior or anterior cervical chain or axillae bilaterally.  Gait analysis shows the patient does have knees track over the medial  line. MSK:  Non tender with full range of motion and good stability and symmetric strength and tone of shoulders, elbows,  hip, knee and ankles bilaterally.    Foot exam shows the patient does have redo the transverse arch bilaterally. Patient does have some hypertrophy of the hallux flexor muscle. Otherwise fairly unremarkable. Patient is minimally tender to palpation between the third and fourth and fourth and fifth metatarsal heads.     Patient was fitted for a : standard, cushioned, semi-rigid orthotic. The orthotic was heated and afterward the patient stood on the orthotic blank positioned on the orthotic stand. The patient was positioned in subtalar neutral position and 10 degrees of ankle dorsiflexion in a weight bearing stance. After completion of molding, a stable base was applied to the orthotic blank. The blank was ground to a stable position for weight bearing. Size:11 Base: [facility] and Padding: None The patient ambulated these, and they were very comfortable.  I spent 45 minutes with this patient, greater than 50% was face-to-face time counseling regarding the below diagnosis.  Impression and Recommendations:

## 2014-07-26 NOTE — Assessment & Plan Note (Signed)
Patient did have custom orthotics made today. We discussed icing regimen. We discussed home exercises as well. We discussed slowly increasing exercises. We also slowly increase the wear of the orthotics. Patient will try these different changes and come back and see me again in 3 weeks.

## 2014-07-26 NOTE — Assessment & Plan Note (Signed)
Likely secondary to the flare patient is having with his stomach. Discussed home exercises and showed proper technique.

## 2014-08-01 ENCOUNTER — Telehealth: Payer: Self-pay | Admitting: Internal Medicine

## 2014-08-01 ENCOUNTER — Encounter: Payer: Self-pay | Admitting: Family Medicine

## 2014-08-01 NOTE — Telephone Encounter (Signed)
Left message for patient to call back  

## 2014-08-02 ENCOUNTER — Ambulatory Visit (INDEPENDENT_AMBULATORY_CARE_PROVIDER_SITE_OTHER): Payer: 59 | Admitting: Gastroenterology

## 2014-08-02 ENCOUNTER — Telehealth: Payer: Self-pay | Admitting: *Deleted

## 2014-08-02 ENCOUNTER — Other Ambulatory Visit (INDEPENDENT_AMBULATORY_CARE_PROVIDER_SITE_OTHER): Payer: 59

## 2014-08-02 ENCOUNTER — Encounter: Payer: Self-pay | Admitting: Gastroenterology

## 2014-08-02 VITALS — BP 110/82 | HR 64 | Ht 70.5 in | Wt 151.0 lb

## 2014-08-02 DIAGNOSIS — K50919 Crohn's disease, unspecified, with unspecified complications: Secondary | ICD-10-CM

## 2014-08-02 LAB — BASIC METABOLIC PANEL
BUN: 14 mg/dL (ref 6–23)
CALCIUM: 9.4 mg/dL (ref 8.4–10.5)
CHLORIDE: 98 meq/L (ref 96–112)
CO2: 30 mEq/L (ref 19–32)
CREATININE: 1 mg/dL (ref 0.4–1.5)
GFR: 84.26 mL/min (ref >60.00)
Glucose, Bld: 109 mg/dL — ABNORMAL HIGH (ref 70–99)
Potassium: 3.9 mEq/L (ref 3.5–5.1)
Sodium: 135 mEq/L (ref 135–145)

## 2014-08-02 LAB — CBC WITH DIFFERENTIAL/PLATELET
Basophils Absolute: 0 10*3/uL (ref 0.0–0.1)
Basophils Relative: 0.2 % (ref 0.0–3.0)
EOS ABS: 0 10*3/uL (ref 0.0–0.7)
Eosinophils Relative: 0.4 % (ref 0.0–5.0)
HCT: 39.8 % (ref 39.0–52.0)
HEMOGLOBIN: 12.8 g/dL — AB (ref 13.0–17.0)
Lymphocytes Relative: 11.9 % — ABNORMAL LOW (ref 12.0–46.0)
Lymphs Abs: 1.2 10*3/uL (ref 0.7–4.0)
MCHC: 32.1 g/dL (ref 30.0–36.0)
MCV: 82.6 fl (ref 78.0–100.0)
MONO ABS: 0.4 10*3/uL (ref 0.1–1.0)
Monocytes Relative: 4 % (ref 3.0–12.0)
NEUTROS ABS: 8.7 10*3/uL — AB (ref 1.4–7.7)
Neutrophils Relative %: 83.5 % — ABNORMAL HIGH (ref 43.0–77.0)
Platelets: 356 10*3/uL (ref 150.0–400.0)
RBC: 4.81 Mil/uL (ref 4.22–5.81)
RDW: 16 % — ABNORMAL HIGH (ref 11.5–15.5)
WBC: 10.4 10*3/uL (ref 4.0–10.5)

## 2014-08-02 LAB — C-REACTIVE PROTEIN: CRP: <0.5 mg/dL (ref 0.5–20.0)

## 2014-08-02 LAB — SEDIMENTATION RATE: SED RATE: 23 mm/h — AB (ref 0–22)

## 2014-08-02 MED ORDER — PREDNISONE 10 MG PO TABS: 10.0000 mg | ORAL_TABLET | Freq: Every day | ORAL | Status: DC

## 2014-08-02 MED ORDER — MESALAMINE 1000 MG RE SUPP: 1000.0000 mg | Freq: Every day | RECTAL | Status: DC

## 2014-08-02 MED ORDER — AMBULATORY NON FORMULARY MEDICATION: Status: DC

## 2014-08-02 NOTE — Telephone Encounter (Signed)
Left message for patient to call back  

## 2014-08-02 NOTE — Telephone Encounter (Signed)
Ok to send generic mesalamine enema in 7 count for now, but was not aware that generic existed.        Thank you,        Jess        ----- Message -----     From: [name]     Sent: 08/02/2014  2:06 PM      To: Laban Emperor. Zehr, PA-C    Subject: Visit Follow-Up Question                   ----- Message from Hulan Saas, RN sent at 08/02/2014 2:06 PM EST -----            ----- Message from [name] to [place] D. Zehr, PA-C sent at 08/02/2014 1:29 PM -----     Milon Dikes suppository at 1g of mesalamine coming back at $250 for 30 count.     What would you think of generic 4g mesalamine enema, which comes as 7 count or 28 count.     If you think worth a try, please Rx for 7 count so I can compare costs. As its generic I'm hoping it will be substantially less, and adequate for acute treatment.          Select Font Size

## 2014-08-02 NOTE — Patient Instructions (Addendum)
Your physician has requested that you go to the basement for the following lab work before leaving today: CBC BMET ESR CRP  We have sent the following medications to your pharmacy for you to pick up at your convenience: [place], insert per rectum once daily   Alonza Bogus, PA-C has advised that you be on a prednisone taper. The taper instructions are as follows: 40 mg for five days 30 mg for five days 20 mg for five days 10 mg for five days  Then stop  You have a follow up visit scheduled with Dr. Carlean Purl for 10-11-2014 at 8:30 am. If you are not feeling better please call [phone].

## 2014-08-02 NOTE — Telephone Encounter (Signed)
Rx sent to pharmacy. Patient notified by [place].

## 2014-08-02 NOTE — Telephone Encounter (Signed)
Patient was worked into the office today to be seen

## 2014-08-03 ENCOUNTER — Ambulatory Visit: Payer: 59 | Admitting: Physician Assistant

## 2014-08-05 ENCOUNTER — Encounter: Payer: Self-pay | Admitting: Gastroenterology

## 2014-08-05 NOTE — Progress Notes (Signed)
     08/05/2014 Charles [name] [id number] 08/31/1970   History of Present Illness:  This is a 43 year old male who has a history of Crohn's disease of the large and small intestines with colectomy and ileoanal pull-through when originally thought to have ulcerative colitis. He had been in remission other than some endoscopic pouchitis changes for a long time now. He had been maintained on Remicade. At the end of 2014 he had surgery at [facility] with hernia repair plastic surgery to improve postoperative changes from prior surgeries.  At his last visit with Dr. Carlean Purl in February his Remicade was discontinued since he had been doing well (and at his request).  He is here today with a flare of his Crohn's disease.  He says that recently he had been "hurting all over" and had some increased in stool frequency.  He's also been having rectal spasm but no relief with BM.  He was given 50 mg prednisone bursts from the sports medicine physician that has been treating some orthopedic issues.  He has now been on prednisone 50 mg daily for 6 or 7 days and figured that he needs to taper off of it at this point.  Also asking to restart Canasa suppositories.  Has seen some improvement in his symptoms since being on the prednisone and is asking if we can taper it fairly quickly.   Current Medications, Allergies, Past Medical History, Past Surgical History, Family History and Social History were reviewed in [employer] record.   Physical Exam: BP 110/82 mmHg  Pulse 64  Ht 5' 10.5" (1.791 m)  Wt 151 lb (68.493 kg)  BMI 21.35 kg/m2 General: Well developed white male in no acute distress Head: Normocephalic and atraumatic Eyes:  Sclerae anicteric, conjunctiva pink  Ears: Normal auditory acuity Lungs: Clear throughout to auscultation Heart: Regular rate and rhythm Abdomen: Soft, non-distended.  Normal bowel sounds.  Mild suprapubic TTP without R/R/G.   Rectal:  No external hemorrhoids noted.   Mild TTP on DRE and some slight edema was noted on the left. Musculoskeletal: Symmetrical with no gross deformities  Extremities: No edema  Neurological: Alert oriented x 4, grossly non-focal Psychological:  Alert and cooperative. Normal mood and affect  Assessment and Recommendations: -43 year old male with Crohn's disease:  Has not been on any maintenance medication for the past 10 months or so.  Recently placed on prednisone by a different physician.  Will taper prednisone, but he is asking if we can taper it fairly quickly.  Will have him decrease to 40 mg x 5 days and continue to decreased by 10 mg every 5 days.  Will restart Canasa suppositories at bedtime as well.  Check CBC, BMP, sed rate, and high-sensitivity CRP today as well.  Follow-up with Dr. Carlean Purl in 4-6 weeks to reassess and discuss if any other maintenance treatment should be started.

## 2014-08-09 NOTE — Progress Notes (Signed)
Agree with Ms. Zehr's management.  Carl E. Gessner, MD, FACG  

## 2014-08-17 ENCOUNTER — Ambulatory Visit: Payer: 59 | Admitting: Family Medicine

## 2014-08-17 ENCOUNTER — Ambulatory Visit (INDEPENDENT_AMBULATORY_CARE_PROVIDER_SITE_OTHER): Payer: 59 | Admitting: Family Medicine

## 2014-08-17 ENCOUNTER — Other Ambulatory Visit (INDEPENDENT_AMBULATORY_CARE_PROVIDER_SITE_OTHER): Payer: 59

## 2014-08-17 ENCOUNTER — Encounter: Payer: Self-pay | Admitting: Family Medicine

## 2014-08-17 VITALS — BP 140/84 | HR 82 | Ht 70.5 in | Wt 151.0 lb

## 2014-08-17 DIAGNOSIS — M79662 Pain in left lower leg: Secondary | ICD-10-CM | POA: Insufficient documentation

## 2014-08-17 DIAGNOSIS — M79672 Pain in left foot: Secondary | ICD-10-CM

## 2014-08-17 NOTE — Patient Instructions (Signed)
Good to see you Vitamin D 4000 IU daily for the next 2 weeks then 2000 IU daily.  Wear a compression sleeve with walking for next 2 weeks.  Wear good shoes  Cross train with running only 2 times a week for next 2 weeks.  Then increase to 3 times a week Then see me again in 3-4 weeks.

## 2014-08-17 NOTE — Assessment & Plan Note (Addendum)
Patient does have pain initiated and seems to be localized but no findings on ultrasound today. I do not feel that x-rays would make any significant changes. Patient is on a proton pump inhibitor as well as prednisone than likely decreases his absorption of vitamin D. Discussed that he needs to take this on a regular basis. Patient has not had this checked for greater than 9 years and highest level ever was 37. 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 Patient is going to do compression sleeve as well. Discussed icing protocol. Discussed numbness and if this occurs to come back. Patient will doing icing protocol as well. Discuss proper shoe wear. Patient will limit the amount of running over the course of the next 2 weeks. Patient and will come back and see me in 3-4 weeks. Continuing to have pain further imaging may be necessary. I would also want to test his vitamin D.  Spent greater than 25 minutes with patient face-to-face and had greater than 50% of counseling including as described above in assessment and plan.

## 2014-08-17 NOTE — Progress Notes (Signed)
  [name] [place], Charles [name] [address] Phone: [phone] Subjective:    CC:  foot pain, new left leg pain  [id number] Charles [name] is a 43 y.o. male coming in recurrent foot pain.  Patient has had an Morton's neuroma injections previously and continues to do well overall. Discussed that he is able to run 15 miles without any pain. Not stopping him from daily activities.  Patient though did have an exacerbation is still on prednisone for his ulcerative colitis. Patient has been increasing his running and has had some mild pain on the medial aspect of his ankle. Patient has even had to stop running one time before. Patient has never had stress fractures previously. Patient states it is sore and even can be sore with walking. Patient describes it as a dull aching pain that can be severe. Seems to be fairly localized. Denies any swelling, redness or bruising. Rates the severity of pain is 6 out of 10.    Past medical history, social, surgical and family history all reviewed in electronic medical record.   Review of Systems: No headache, visual changes, nausea, vomiting, diarrhea, constipation, dizziness, abdominal pain, skin rash, fevers, chills, night sweats, weight loss, swollen lymph nodes, body aches, joint swelling, muscle aches, chest pain, shortness of breath, mood changes.   Objective Blood pressure 140/84, pulse 82, height 5' 10.5" (1.791 m), weight 151 lb (68.493 kg), SpO2 98 %.  General: No apparent distress alert and oriented x3 mood and affect normal, dressed appropriately.  HEENT: Pupils equal, extraocular movements intact  Respiratory: Patient's speak in full sentences and does not appear short of breath  Cardiovascular: No lower extremity edema, non tender, no erythema  Skin: Warm dry intact with no signs of infection or rash on extremities or on axial skeleton.  Abdomen: Soft nontender  Neuro: Cranial nerves II through XII are  intact, neurovascularly intact in all extremities with 2+ DTRs and 2+ pulses.  Lymph: No lymphadenopathy of posterior or anterior cervical chain or axillae bilaterally.  Gait analysis shows the patient does have knees track over the medial line. MSK:  Non tender with full range of motion and good stability and symmetric strength and tone of shoulders, elbows,  hip, knee and ankles bilaterally.    Foot exam shows the patient does have redo the transverse arch bilaterally. Patient does have some hypertrophy of the hallux flexor muscle. Otherwise fairly unremarkable.   Patient is tender to palpation though on the proximal portion of the distal third of the tibia on the medial aspect. No swelling or redness noted.  Limited muscular skeletal ultrasound was performed and interpreted by Hulan Saas, M  Limited ultrasound does not show any specific bony abnormality or mild hypoechoic changes noted just above the bone. No fracture noted or any cortical defect. Mild increase in Doppler flow in the area.      Impression and Recommendations:

## 2014-08-23 ENCOUNTER — Telehealth: Payer: Self-pay | Admitting: Gastroenterology

## 2014-08-23 ENCOUNTER — Telehealth: Payer: Self-pay | Admitting: *Deleted

## 2014-08-23 NOTE — Telephone Encounter (Signed)
Patient called back and he will be out of town on 09/06/14. Rescheduled OV for 09/12/14 at 9:15 AM.

## 2014-08-23 NOTE — Telephone Encounter (Signed)
Spoke with patient and he is in [place] with his family until next Tuesday. He states he had gotten down to Prednisone 5 mg/daily on taper. For 2 days, he had 10 bowel movements/day. Yesterday, he increased his Prednisone to 20 mg/day. He is having 10 stools/day. Up at night x 2 and now has abdominal cramping. He had a few Hydrocodone tablets that he is taking for pain. He is asking what he can do until he can get back home. He is scheduled for an OV with Dr. Carlean Purl on 10/11/14 but thinks he will need an earlier appointment to discuss what to do. Please, advise.

## 2014-08-23 NOTE — Telephone Encounter (Signed)
We can send him a prescription for prednisone in to a pharmacy in [facility] and have him increase to 40 mg daily until next week and see how he is doing.  He can be changed to see Dr. Carlean Purl on 1/19 as we discussed.  Will drop to 30 mg after one week if doing ok and is to continue that until he sees Dr. Carlean Purl.  Thank you,  Jess

## 2014-08-23 NOTE — Telephone Encounter (Signed)
Spoke with patient and gave him recommendations and OV date/time. He will call back if he needs a Prednisone rx.

## 2014-08-26 ENCOUNTER — Telehealth: Payer: Self-pay | Admitting: Internal Medicine

## 2014-08-26 NOTE — Telephone Encounter (Signed)
Contacted patient and added him on for Wed at 9:15

## 2014-08-26 NOTE — Telephone Encounter (Signed)
I called him He has been flaring again as he tapered prednisone.  Is going back to 40 mg daily as previously instructed by Janett Billow.  I have told him he can use loperamide.  He is out of town - will be available to be seen T - Fri next week.  We can add him to my We schedule somewhere or APP and I will see him with them that AM.  We need to go ahead with Humira Rx for Crohn's disease  He thought he was up to date on PPD through PCP but after we hung up looks like last was 05/2013.  Will either need a PPD or quantiferon when he comes back to office.

## 2014-08-31 ENCOUNTER — Other Ambulatory Visit: Payer: Self-pay | Admitting: Internal Medicine

## 2014-08-31 ENCOUNTER — Encounter: Payer: Self-pay | Admitting: Internal Medicine

## 2014-08-31 ENCOUNTER — Ambulatory Visit (INDEPENDENT_AMBULATORY_CARE_PROVIDER_SITE_OTHER): Payer: 59 | Admitting: Internal Medicine

## 2014-08-31 ENCOUNTER — Other Ambulatory Visit (INDEPENDENT_AMBULATORY_CARE_PROVIDER_SITE_OTHER): Payer: 59

## 2014-08-31 VITALS — BP 130/62 | HR 72 | Ht 69.25 in | Wt 151.1 lb

## 2014-08-31 DIAGNOSIS — K91850 Pouchitis: Secondary | ICD-10-CM

## 2014-08-31 DIAGNOSIS — E559 Vitamin D deficiency, unspecified: Secondary | ICD-10-CM | POA: Insufficient documentation

## 2014-08-31 DIAGNOSIS — K50818 Crohn's disease of both small and large intestine with other complication: Secondary | ICD-10-CM

## 2014-08-31 DIAGNOSIS — M8580 Other specified disorders of bone density and structure, unspecified site: Secondary | ICD-10-CM

## 2014-08-31 LAB — CBC WITH DIFFERENTIAL/PLATELET
Basophils Absolute: 0 10*3/uL (ref 0.0–0.1)
Basophils Relative: 0.1 % (ref 0.0–3.0)
EOS PCT: 0.3 % (ref 0.0–5.0)
Eosinophils Absolute: 0 10*3/uL (ref 0.0–0.7)
HEMATOCRIT: 40.4 % (ref 39.0–52.0)
Hemoglobin: 13.1 g/dL (ref 13.0–17.0)
LYMPHS PCT: 10.4 % — AB (ref 12.0–46.0)
Lymphs Abs: 1.4 10*3/uL (ref 0.7–4.0)
MCHC: 32.3 g/dL (ref 30.0–36.0)
MCV: 84 fl (ref 78.0–100.0)
MONOS PCT: 2 % — AB (ref 3.0–12.0)
Monocytes Absolute: 0.3 10*3/uL (ref 0.1–1.0)
NEUTROS PCT: 87.2 % — AB (ref 43.0–77.0)
Neutro Abs: 11.6 10*3/uL — ABNORMAL HIGH (ref 1.4–7.7)
Platelets: 376 10*3/uL (ref 150.0–400.0)
RBC: 4.81 Mil/uL (ref 4.22–5.81)
RDW: 15.7 % — ABNORMAL HIGH (ref 11.5–15.5)
WBC: 13.4 10*3/uL — AB (ref 4.0–10.5)

## 2014-08-31 LAB — COMPREHENSIVE METABOLIC PANEL
ALBUMIN: 4 g/dL (ref 3.5–5.2)
ALK PHOS: 65 U/L (ref 39–117)
ALT: 15 U/L (ref 0–53)
AST: 16 U/L (ref 0–37)
BUN: 16 mg/dL (ref 6–23)
CO2: 30 mEq/L (ref 19–32)
Calcium: 9.5 mg/dL (ref 8.4–10.5)
Chloride: 99 mEq/L (ref 96–112)
Creatinine, Ser: 0.99 mg/dL (ref 0.40–1.50)
GFR: 88.17 mL/min (ref >60.00)
Glucose, Bld: 95 mg/dL (ref 70–99)
POTASSIUM: 4.1 meq/L (ref 3.5–5.1)
Sodium: 136 mEq/L (ref 135–145)
Total Bilirubin: 1.8 mg/dL — ABNORMAL HIGH (ref 0.2–1.2)
Total Protein: 7.4 g/dL (ref 6.0–8.3)

## 2014-08-31 LAB — VITAMIN D 25 HYDROXY (VIT D DEFICIENCY, FRACTURES): VITD: 16.45 ng/mL — ABNORMAL LOW (ref 30.00–100.00)

## 2014-08-31 MED ORDER — VITAMIN D (ERGOCALCIFEROL) 1.25 MG (50000 UNIT) PO CAPS: 50000.0000 [IU] | ORAL_CAPSULE | ORAL | Status: DC

## 2014-08-31 MED ORDER — PREDNISONE 10 MG PO TABS: 30.0000 mg | ORAL_TABLET | Freq: Every day | ORAL | Status: DC

## 2014-08-31 MED ORDER — HYDROCORTISONE ACETATE 25 MG RE SUPP: 25.0000 mg | Freq: Every evening | RECTAL | Status: DC | PRN

## 2014-08-31 MED ORDER — MESALAMINE-CLEANSER 4 G RE KIT: 1.0000 | PACK | Freq: Every day | RECTAL | Status: DC

## 2014-08-31 MED ORDER — DIPHENOXYLATE-ATROPINE 2.5-0.025 MG PO TABS: 1.0000 | ORAL_TABLET | Freq: Four times a day (QID) | ORAL | Status: DC | PRN

## 2014-08-31 NOTE — Assessment & Plan Note (Signed)
There is a history of this. He will need a follow-up DEXA scan. He is taking vitamin D. We'll coordinate this at a later date as it doesn't change anything right now.

## 2014-08-31 NOTE — Progress Notes (Signed)
   Subjective:    Patient ID: Charles [name], male    DOB: [birth year], 44 y.o.   MRN: [id number]  HPI Charles [name] is here for follow-up of his Crohn's disease with pouchitis. He is had a rough time over the past couple months. He saw one of the PAs in December and was on a prednisone taper going down by 10 mg from 40 every 5 days. He went to [place] for vacation with the family and when he got down below 20 mg of prednisone his symptoms recurred particularly when he went from 10-5 mg he was having bad diarrhea and some bleeding that has since stopped.. Anal discomfort also but no purulent discharge or signs of abscesses. He went back to 40 mg on the prednisone about a week ago. He is still trying to use Rowasa enema though he has run out of that and needs a refill. He is able to retain that some. He has been using some Imodium right ear as well. He has started back on vitamin D supplementation also he says calcium is difficult because he gets an upset stomach from that. So he is not taking that.  The past day or so things are a bit better. He slept through the night. Stools are less frequent and urgent. There is no bleeding. He is not having abdominal pain. 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  Medications, allergies, past medical history, past surgical history, family history and social history are reviewed and updated in the EMR.  Review of Systems As above    Objective:   Physical Exam  General:  Thin but in no acute distress Eyes:  anicteric. Lungs: Clear to auscultation bilaterally. Heart:  S1S2, no rubs, murmurs, gallops. Abdomen: Thin with previous surgical scars soft, non-tender, no hepatosplenomegaly, hernia, or mass and BS+.  Rectal:  Inspection reveals no amount is, palpation of the perianal area is without tenderness or fluctuance or mass. Neuro:  A&O x 3.    Data Reviewed: Previous labs. From December 2015 Wt  Readings from Last 3 Encounters:  08/31/14 151 lb 2 oz (68.55 kg)  08/17/14 151 lb (68.493 kg)  08/02/14 151 lb (68.493 kg)        Assessment & Plan:  CROHN'S DISEASE, LARGE AND SMALL INTESTINES He is now flaring, off biologic therapy. Biologic therapy was stopped within the last year. He was aware of the chance of recurrence of problems.  Taper to 30 mg prednisone and check in re: taper from there. Rowasa x 1 more week at least and I have also prescribed hydrocortisone suppositories to have on hands we figure out the best topical therapy. Lomotil as needed Start Humira, prescription approved awaiting delivery CBC, CMET, vitamin D level today. Return to clinic in 2 months. We will check him by phone or my chart more frequently.   Pouchitis See crohn's   Osteopenia There is a history of this. He will need a follow-up DEXA scan. He is taking vitamin D. We'll coordinate this at a later date as it doesn't change anything right now.   I appreciate the opportunity to care for this patient. CC: TODD,JEFFREY ALLEN, MD

## 2014-08-31 NOTE — Assessment & Plan Note (Addendum)
He is now flaring, off biologic therapy. Biologic therapy was stopped within the last year. He was aware of the chance of recurrence of problems.  Taper to 30 mg prednisone and check in re: taper from there. Rowasa x 1 more week at least and I have also prescribed hydrocortisone suppositories to have on hands we figure out the best topical therapy. Lomotil as needed Start Humira, prescription approved awaiting delivery CBC, CMET, vitamin D level today. Return to clinic in 2 months. We will check him by phone or my chart more frequently.

## 2014-08-31 NOTE — Progress Notes (Signed)
Quick Note:  Lab results sent by My Chart ______

## 2014-08-31 NOTE — Patient Instructions (Addendum)
Your Humira has been approved.  [facility] will be calling you about this and the medicine will come to you thru [facility].    Today you have been given a printed rx for Lomotil, and Prednisone to take to your pharmacy.  Also prescribed Rowasa generic, and hydrocortisone suppositories as discussed.  Your physician has requested that you go to the basement for the following lab work before leaving today: CBC/diff, CMET, Vitamin D  Check in with me by cell at end of week or weekend and anytime after that.  Follow up with Dr Carlean Purl in 2 months.  I appreciate the opportunity to care for you. Gatha Mayer, MD, [facility]

## 2014-08-31 NOTE — Assessment & Plan Note (Signed)
See crohn's

## 2014-09-06 ENCOUNTER — Ambulatory Visit: Payer: Self-pay | Admitting: Internal Medicine

## 2014-09-06 ENCOUNTER — Ambulatory Visit: Payer: 59 | Admitting: Internal Medicine

## 2014-09-12 ENCOUNTER — Ambulatory Visit: Payer: Self-pay | Admitting: Internal Medicine

## 2014-09-16 ENCOUNTER — Other Ambulatory Visit: Payer: Self-pay | Admitting: Family Medicine

## 2014-09-20 ENCOUNTER — Other Ambulatory Visit: Payer: Self-pay | Admitting: *Deleted

## 2014-09-20 MED ORDER — MOMETASONE FURO-FORMOTEROL FUM 100-5 MCG/ACT IN AERO: 2.0000 | INHALATION_SPRAY | Freq: Every day | RESPIRATORY_TRACT | Status: DC

## 2014-10-11 ENCOUNTER — Ambulatory Visit: Payer: 59 | Admitting: Internal Medicine

## 2014-10-11 ENCOUNTER — Ambulatory Visit: Payer: Self-pay | Admitting: Internal Medicine

## 2014-10-24 ENCOUNTER — Other Ambulatory Visit: Payer: Self-pay | Admitting: Family Medicine

## 2014-10-24 ENCOUNTER — Encounter: Payer: Self-pay | Admitting: Internal Medicine

## 2014-10-24 MED ORDER — HYDROCODONE-HOMATROPINE 5-1.5 MG/5ML PO SYRP: 5.0000 mL | ORAL_SOLUTION | Freq: Three times a day (TID) | ORAL | Status: DC | PRN

## 2014-11-15 ENCOUNTER — Encounter: Payer: Self-pay | Admitting: Family Medicine

## 2014-11-15 ENCOUNTER — Ambulatory Visit (INDEPENDENT_AMBULATORY_CARE_PROVIDER_SITE_OTHER): Payer: 59 | Admitting: Family Medicine

## 2014-11-15 VITALS — BP 120/82 | HR 91 | Ht 69.25 in | Wt 159.0 lb

## 2014-11-15 DIAGNOSIS — M216X1 Other acquired deformities of right foot: Secondary | ICD-10-CM

## 2014-11-15 DIAGNOSIS — G5761 Lesion of plantar nerve, right lower limb: Secondary | ICD-10-CM | POA: Diagnosis not present

## 2014-11-15 NOTE — Assessment & Plan Note (Signed)
Discussed with patient at great length. We will avoid any injection at this time we may want to consider repeat injection in the near future.

## 2014-11-15 NOTE — Patient Instructions (Signed)
Stand on wall with heels, butt shoulder and head touchig for goal of 5 minutes daily Continue the vitamin D  Consider turmeric 539m daily We will get you in orthotics I like biking for now Tell me if you want [facility] and I will refer.  Ice stil at night when acting up See me again in 3 weeks for manipulation and to discuss injection of foot.

## 2014-11-15 NOTE — Progress Notes (Signed)
Pre visit review using our clinic review tool, if applicable. No additional management support is needed unless otherwise documented below in the visit note. 

## 2014-11-15 NOTE — Assessment & Plan Note (Signed)
Patient has had somewhat of a difficult time. We discussed continuing the vitamin D supplementation. We discussed the possibility of actually injecting him again for another Morton's neuroma but this is not as severe as it was previously. We also discussed possible PRP injections. At this point patient fitted for custom orthotics and we'll see if he makes any improvement. Patient will continue with topical anti-inflammatories. We discussed other natural supplementations that may be beneficial with his other medical problems. Patient and will come back and see me again in 3 weeks if continuing have pain we'll consider another injection.  Spent  25 minutes with patient face-to-face and had greater than 50% of counseling including as described above in assessment and plan.

## 2014-11-15 NOTE — Progress Notes (Signed)
  [name] [place],  [address] Phone: [phone] Subjective:    CC:  foot pain, new left leg pain  [id number] Charles [name] is a 44 y.o. male coming in recurrent foot pain.  Patient has had an Morton's neuroma injections previously and continues to do well overall. Discussed that he is able to run 15 miles without any pain. Not stopping him from daily activities.  Patient though did have an exacerbation is still on prednisone for his ulcerative colitis. Patient has been increasing his running and has had some mild pain on the medial aspect of his ankle. Patient has even had to stop running one time before. Patient has never had stress fractures previously. Patient states it is sore and even can be sore with walking. Patient describes it as a dull aching pain that can be severe. Seems to be fairly localized. Denies any swelling, redness or bruising. Rates the severity of pain is 6 out of 10.     Past medical history, social, surgical and family history all reviewed in electronic medical record.   Review of Systems: No headache, visual changes, nausea, vomiting, diarrhea, constipation, dizziness, abdominal pain, skin rash, fevers, chills, night sweats, weight loss, swollen lymph nodes, body aches, joint swelling, muscle aches, chest pain, shortness of breath, mood changes.   Objective Blood pressure 120/82, pulse 91, height 5' 9.25" (1.759 m), weight 159 lb (72.122 kg), SpO2 97 %.  General: No apparent distress alert and oriented x3 mood and affect normal, dressed appropriately.  HEENT: Pupils equal, extraocular movements intact  Respiratory: Patient's speak in full sentences and does not appear short of breath  Cardiovascular: No lower extremity edema, non tender, no erythema  Skin: Warm dry intact with no signs of infection or rash on extremities or on axial skeleton.  Abdomen: Soft nontender  Neuro: Cranial nerves II through XII are  intact, neurovascularly intact in all extremities with 2+ DTRs and 2+ pulses.  Lymph: No lymphadenopathy of posterior or anterior cervical chain or axillae bilaterally.  Gait analysis shows the patient does have knees track over the medial line. MSK:  Non tender with full range of motion and good stability and symmetric strength and tone of shoulders, elbows,  hip, knee and ankles bilaterally.    Foot exam shows the patient does have loss of the transverse arch bilaterally. Patient does have some hypertrophy of the hallux flexor muscle. Otherwise fairly unremarkable.         Impression and Recommendations:

## 2014-11-24 ENCOUNTER — Ambulatory Visit (INDEPENDENT_AMBULATORY_CARE_PROVIDER_SITE_OTHER): Payer: 59 | Admitting: Family Medicine

## 2014-11-24 DIAGNOSIS — G5761 Lesion of plantar nerve, right lower limb: Secondary | ICD-10-CM

## 2014-11-24 DIAGNOSIS — M216X1 Other acquired deformities of right foot: Secondary | ICD-10-CM

## 2014-11-24 DIAGNOSIS — M7661 Achilles tendinitis, right leg: Secondary | ICD-10-CM

## 2014-11-24 NOTE — Patient Instructions (Signed)

## 2014-11-24 NOTE — Progress Notes (Signed)
Patient was fitted for a : standard, cushioned, semi-rigid orthotic. The orthotic was heated and afterward the patient was in a seated position and the orthotic molded. The patient was positioned in subtalar neutral position and 10 degrees of ankle dorsiflexion in a non-weight bearing stance. After completion of molding, patient did have orthotic management which included instructions on acclimating to the orthotics, signs of ill fit as well as care for the orthotic.  We spent 45 minutes discussing foot strike patterns, running gait, orthotic care and fit, as well as transferring between shoes.   The blank was ground to a stable position for weight bearing. Size: 10.5 (Igli Alround)  Base: Carbon fiber Additional Posting and Padding: The following postings were fitted onto the molded orthotics to help maintain a talar neutral position - Wedge posting for transverse arch: Right 200/50 Left 585/27     Silicone posting for longitudinal arch: Right 250/100 x2  Left 250/120 and 250/100  The patient ambulated these, and they were very comfortable and supportive.

## 2014-11-25 ENCOUNTER — Encounter: Payer: Self-pay | Admitting: Family Medicine

## 2014-11-25 NOTE — Assessment & Plan Note (Signed)
Placed in orthotics look at patient instructions.

## 2014-12-06 ENCOUNTER — Encounter: Payer: Self-pay | Admitting: Family Medicine

## 2014-12-06 ENCOUNTER — Ambulatory Visit (INDEPENDENT_AMBULATORY_CARE_PROVIDER_SITE_OTHER): Payer: 59 | Admitting: Family Medicine

## 2014-12-06 VITALS — BP 116/82 | HR 74 | Ht 69.0 in | Wt 159.0 lb

## 2014-12-06 DIAGNOSIS — M9902 Segmental and somatic dysfunction of thoracic region: Secondary | ICD-10-CM

## 2014-12-06 DIAGNOSIS — M216X1 Other acquired deformities of right foot: Secondary | ICD-10-CM

## 2014-12-06 DIAGNOSIS — M546 Pain in thoracic spine: Secondary | ICD-10-CM | POA: Insufficient documentation

## 2014-12-06 DIAGNOSIS — M9903 Segmental and somatic dysfunction of lumbar region: Secondary | ICD-10-CM

## 2014-12-06 DIAGNOSIS — M9908 Segmental and somatic dysfunction of rib cage: Secondary | ICD-10-CM | POA: Diagnosis not present

## 2014-12-06 DIAGNOSIS — M999 Biomechanical lesion, unspecified: Secondary | ICD-10-CM | POA: Insufficient documentation

## 2014-12-06 DIAGNOSIS — R079 Chest pain, unspecified: Secondary | ICD-10-CM | POA: Insufficient documentation

## 2014-12-06 NOTE — Progress Notes (Signed)
  [name] [address] [place], [place] [address] Phone: [phone] Subjective:    CC:  recurrent foot pain  [name] Charles [name] is a 44 y.o. male coming in recurrent foot pain.  Patient has had an Morton's neuroma injections previously and had been doing well for quite some time. Patient started having worsening symptoms again as well as pain of the left shin for possible stress reaction. Patient was given new custom orthotics. Patient states he is doing much better in the orthotics. Patient has not been running a significant amount but has been biking regularly. Patient sits with his regular daily activities his feet do feel better. Patient denies any worsening of any other symptoms. Patient is happy with the results.  Patient is having some upper back pain. More of a dull throbbing and sensation. Patient has responded well to chiropractors in the past. Patient was using a chain saw the other day. She states that that seem to exacerbate it. Feels more like a muscle tightness.  Point makes this difficult this patient's history of Crohn's disease. Patient is on vitamin D supplementation.      Past medical history, social, surgical and family history all reviewed in electronic medical record.   Review of Systems: No headache, visual changes, nausea, vomiting, diarrhea, constipation, dizziness, abdominal pain, skin rash, fevers, chills, night sweats, weight loss, swollen lymph nodes, body aches, joint swelling, muscle aches, chest pain, shortness of breath, mood changes.   Objective Blood pressure 116/82, pulse 74, height 5' 9"  (1.753 m), weight 159 lb (72.122 kg), SpO2 99 %.  General: No apparent distress alert and oriented x3 mood and affect normal, dressed appropriately.  HEENT: Pupils equal, extraocular movements intact  Respiratory: Patient's speak in full sentences and does not appear short of breath  Cardiovascular: No lower extremity edema,  non tender, no erythema  Skin: Warm dry intact with no signs of infection or rash on extremities or on axial skeleton.  Abdomen: Soft nontender  Neuro: Cranial nerves II through XII are intact, neurovascularly intact in all extremities with 2+ DTRs and 2+ pulses.  Lymph: No lymphadenopathy of posterior or anterior cervical chain or axillae bilaterally.  Gait analysis shows the patient does have knees track over the medial line. MSK:  Non tender with full range of motion and good stability and symmetric strength and tone of shoulders, elbows,  hip, knee and ankles bilaterally.  Mild trigger point over the third rib on the left side in the trapezius muscle.  Foot exam shows the patient does have redo the transverse arch bilaterally.  Otherwise fairly unremarkable. Patient is minimally tender to palpation between the third and fourth and fourth and fifth metatarsal heads.    Osteopathic findings Thoracic T3 extended rotated and side bent right with inhaled Lumbar L2 flexed rotated and side bent right  Impression and Recommendations:

## 2014-12-06 NOTE — Progress Notes (Signed)
Pre visit review using our clinic review tool, if applicable. No additional management support is needed unless otherwise documented below in the visit note. 

## 2014-12-06 NOTE — Assessment & Plan Note (Signed)
Decision today to treat with OMT was based on Physical Exam  After verbal consent patient was treated with HVLA, ME techniques in thoracici, rib and lumbar areas  Patient tolerated the procedure well with improvement in symptoms  Patient given exercises, stretches and lifestyle modifications  See medications in patient instructions if given  Patient will follow up in 4 weeks

## 2014-12-06 NOTE — Assessment & Plan Note (Signed)
Patient is doing significantly better at this time. Encourage patient to continue to increase his activity as tolerated. We discussed icing regimen differently. We discussed also different changes can make to his orthotics over the course of time. Patient will try this and come back and see me again in 4 weeks

## 2014-12-06 NOTE — Patient Instructions (Signed)
Good to see you [place]it[place] to work in the orthotics and adjust accordingly  [place] to start running and break them in Continue to cross train with biking and consider swimming See me in 4 weeks if any pain  Have fun in [place].

## 2014-12-06 NOTE — Assessment & Plan Note (Signed)
Patient's thoracic pain is secondary to inhaled rib as well as a trapezius muscle spasm. Patient did respond well to osteopathic manipulation today. We discussed icing as well as topical anti-inflammatory's. Patient will come back and see me again in 4 weeks for further evaluation and treatment.

## 2014-12-07 ENCOUNTER — Encounter: Payer: Self-pay | Admitting: Family Medicine

## 2014-12-22 ENCOUNTER — Encounter: Payer: Self-pay | Admitting: Internal Medicine

## 2014-12-22 ENCOUNTER — Ambulatory Visit (INDEPENDENT_AMBULATORY_CARE_PROVIDER_SITE_OTHER): Payer: 59 | Admitting: Internal Medicine

## 2014-12-22 VITALS — BP 124/68 | HR 70 | Ht 69.25 in | Wt 155.6 lb

## 2014-12-22 DIAGNOSIS — K50818 Crohn's disease of both small and large intestine with other complication: Secondary | ICD-10-CM | POA: Diagnosis not present

## 2014-12-22 DIAGNOSIS — K91850 Pouchitis: Secondary | ICD-10-CM | POA: Diagnosis not present

## 2014-12-22 MED ORDER — VSL#3 DS PO PACK
1.0000 | PACK | Freq: Every day | ORAL | Status: DC
Start: 2014-12-22 — End: 2016-01-25

## 2014-12-22 NOTE — Assessment & Plan Note (Signed)
Stay on Humira

## 2014-12-22 NOTE — Patient Instructions (Addendum)
Today you have been given an rx for VSL-DS and a coupon.  This will replace your Align. We have sent the following medications to your pharmacy for you to pick up at your convenience: VSL-DS  Follow up with Dr. Carlean Purl in 6 months.  I appreciate the opportunity to care for you. Silvano Rusk, M.D.,FACG

## 2014-12-22 NOTE — Assessment & Plan Note (Addendum)
Continue Humira 40 mg every other week  He will consider VSL double strength probiotic 1 packet daily he will see if this is cost effective He will make a reminder to call me in 4 months like and see him in 2 months and we will consider sigmoidoscopy later this year.

## 2014-12-22 NOTE — Progress Notes (Signed)
   Subjective:    Patient ID: Charles [name], male    DOB: 05/13/1971, 44 y.o.   MRN: [id number] Chief Complaint: Follow-up of Crohn's and pouchitis HPI Charles [name] is much better. He is on Humira and tolerating that well on the severe problems with diarrhea and rectal and abdominal pain are gone. He does have occasional loose stools or burning in the rectum which he treats either with some Preparation H or will use an occasional hydrocortisone suppository. He is generally feeling well. Dietary indiscretion or sometimes alcoholic beverages will trigger some loose stools or burning rectal pain. Is asking about any other new therapies or other ways to treat his problems today. Medications, allergies, past medical history, past surgical history, family history and social history are reviewed and updated in the EMR. Review of Systems As above    Objective:   Physical Exam BP 124/68 mmHg  Pulse 70  Ht 5' 9.25" (1.759 m)  Wt 155 lb 9.6 oz (70.58 kg)  BMI 22.81 kg/m2 Thin but well-appearing middle-aged white man    Assessment & Plan:  Pouchitis Continue Humira 40 mg every other week  He will consider VSL double strength probiotic 1 packet daily he will see if this is cost effective He will make a reminder to call me in 4 months like and see him in 2 months and we will consider sigmoidoscopy later this year.    CROHN'S DISEASE, LARGE AND SMALL INTESTINES Stay on Humira    15 minutes time spent with patient > half in counseling coordination of care

## 2014-12-27 ENCOUNTER — Other Ambulatory Visit: Payer: Self-pay | Admitting: Family Medicine

## 2014-12-27 MED ORDER — CEPHALEXIN 500 MG PO CAPS: ORAL_CAPSULE | ORAL | Status: DC

## 2015-01-02 ENCOUNTER — Encounter: Payer: Self-pay | Admitting: Family Medicine

## 2015-01-02 ENCOUNTER — Ambulatory Visit (INDEPENDENT_AMBULATORY_CARE_PROVIDER_SITE_OTHER): Payer: 59 | Admitting: Family Medicine

## 2015-01-02 VITALS — BP 132/74 | HR 84 | Ht 69.25 in | Wt 156.0 lb

## 2015-01-02 DIAGNOSIS — M9902 Segmental and somatic dysfunction of thoracic region: Secondary | ICD-10-CM

## 2015-01-02 DIAGNOSIS — G5761 Lesion of plantar nerve, right lower limb: Secondary | ICD-10-CM | POA: Diagnosis not present

## 2015-01-02 DIAGNOSIS — M9903 Segmental and somatic dysfunction of lumbar region: Secondary | ICD-10-CM

## 2015-01-02 DIAGNOSIS — M546 Pain in thoracic spine: Secondary | ICD-10-CM | POA: Diagnosis not present

## 2015-01-02 DIAGNOSIS — M9908 Segmental and somatic dysfunction of rib cage: Secondary | ICD-10-CM | POA: Diagnosis not present

## 2015-01-02 DIAGNOSIS — M999 Biomechanical lesion, unspecified: Secondary | ICD-10-CM

## 2015-01-02 NOTE — Assessment & Plan Note (Signed)
Better with manipulation, ice, HEP, no radiation.   Discuss posture changes.  RTC in 4-6 weeks.

## 2015-01-02 NOTE — Assessment & Plan Note (Signed)
Decision today to treat with OMT was based on Physical Exam  After verbal consent patient was treated with HVLA, ME techniques in thoracici, rib and lumbar areas  Patient tolerated the procedure well with improvement in symptoms  Patient given exercises, stretches and lifestyle modifications  See medications in patient instructions if given  Patient will follow up in 4 weeks

## 2015-01-02 NOTE — Patient Instructions (Addendum)
Good to see you Congrats to [place] and [place] for me.  Continue the exercises regularly.   You are doing great.  Ice is your friend Push it! Continue the vitamins See me when you need me or when you need me manipulation.

## 2015-01-02 NOTE — Progress Notes (Signed)
Pre visit review using our clinic review tool, if applicable. No additional management support is needed unless otherwise documented below in the visit note. 

## 2015-01-02 NOTE — Progress Notes (Signed)
  [name] [address] [place], [place] [address] Phone: [phone] Subjective:    CC:  recurrent foot pain  INO:MVEHMCNOBS Charles [name] is a 44 y.o. male coming in recurrent foot pain.  Patient has had an Morton's neuroma injections previously and had been doing well for quite some time.  Patient continues biking is doing much better. Patient has not really ran too much. Patient states that he did do a 4 mile run, 15 mile bike ride, and a 50 mile road bike,. Patient states he had no pain after this.   patient states that his upper back pain and seems to be doing relatively well.  Point makes this difficult this patient's history of Crohn's disease. Patient is on vitamin D supplementation.     Past medical history, social, surgical and family history all reviewed in electronic medical record.   Review of Systems: No headache, visual changes, nausea, vomiting, diarrhea, constipation, dizziness, abdominal pain, skin rash, fevers, chills, night sweats, weight loss, swollen lymph nodes, body aches, joint swelling, muscle aches, chest pain, shortness of breath, mood changes.   Objective Blood pressure 132/74, pulse 84, height 5' 9.25" (1.759 m), weight 156 lb (70.761 kg), SpO2 97 %.  General: No apparent distress alert and oriented x3 mood and affect normal, dressed appropriately.  HEENT: Pupils equal, extraocular movements intact  Respiratory: Patient's speak in full sentences and does not appear short of breath  Cardiovascular: No lower extremity edema, non tender, no erythema  Skin: Warm dry intact with no signs of infection or rash on extremities or on axial skeleton.  Abdomen: Soft nontender  Neuro: Cranial nerves II through XII are intact, neurovascularly intact in all extremities with 2+ DTRs and 2+ pulses.  Lymph: No lymphadenopathy of posterior or anterior cervical chain or axillae bilaterally.  Gait analysis shows the patient does have knees track  over the medial line. MSK:  Non tender with full range of motion and good stability and symmetric strength and tone of shoulders, elbows,  hip, knee and ankles bilaterally.   Foot exam shows the patient does have redo the transverse arch bilaterally.  Otherwise fairly unremarkable. Patient is minimally tender to palpation between the third and fourth and fourth and fifth metatarsal heads.    Osteopathic findings  cervical  C3 flexed rotated and side bent right Thoracic T3 extended rotated and side bent right with inhaled Lumbar L2 flexed rotated and side bent right  Impression and Recommendations:

## 2015-01-02 NOTE — Assessment & Plan Note (Signed)
Mostly resolved at this time.

## 2015-01-04 ENCOUNTER — Other Ambulatory Visit: Payer: Self-pay | Admitting: Internal Medicine

## 2015-02-02 ENCOUNTER — Other Ambulatory Visit: Payer: Self-pay | Admitting: Family Medicine

## 2015-02-09 ENCOUNTER — Inpatient Hospital Stay (HOSPITAL_COMMUNITY)
Admission: EM | Admit: 2015-02-09 | Discharge: 2015-02-10 | DRG: 386 | Disposition: A | Discharge status: Home / Self Care | Payer: 59 | Attending: Internal Medicine | Admitting: Internal Medicine

## 2015-02-09 ENCOUNTER — Encounter (HOSPITAL_COMMUNITY): Payer: Self-pay | Admitting: *Deleted

## 2015-02-09 ENCOUNTER — Emergency Department (HOSPITAL_COMMUNITY): Payer: 59

## 2015-02-09 DIAGNOSIS — Z800 Family history of malignant neoplasm of digestive organs: Secondary | ICD-10-CM

## 2015-02-09 DIAGNOSIS — Z888 Allergy status to other drugs, medicaments and biological substances status: Secondary | ICD-10-CM | POA: Diagnosis not present

## 2015-02-09 DIAGNOSIS — Z932 Ileostomy status: Secondary | ICD-10-CM | POA: Diagnosis not present

## 2015-02-09 DIAGNOSIS — G8929 Other chronic pain: Secondary | ICD-10-CM | POA: Diagnosis present

## 2015-02-09 DIAGNOSIS — K56609 Unspecified intestinal obstruction, unspecified as to partial versus complete obstruction: Secondary | ICD-10-CM

## 2015-02-09 DIAGNOSIS — J322 Chronic ethmoidal sinusitis: Secondary | ICD-10-CM | POA: Diagnosis present

## 2015-02-09 DIAGNOSIS — K565 Intestinal adhesions [bands], unspecified as to partial versus complete obstruction: Secondary | ICD-10-CM

## 2015-02-09 DIAGNOSIS — K50918 Crohn's disease, unspecified, with other complication: Secondary | ICD-10-CM

## 2015-02-09 DIAGNOSIS — Z9049 Acquired absence of other specified parts of digestive tract: Secondary | ICD-10-CM | POA: Diagnosis present

## 2015-02-09 DIAGNOSIS — J019 Acute sinusitis, unspecified: Secondary | ICD-10-CM | POA: Diagnosis present

## 2015-02-09 DIAGNOSIS — M8580 Other specified disorders of bone density and structure, unspecified site: Secondary | ICD-10-CM | POA: Diagnosis present

## 2015-02-09 DIAGNOSIS — Z79899 Other long term (current) drug therapy: Secondary | ICD-10-CM

## 2015-02-09 DIAGNOSIS — E559 Vitamin D deficiency, unspecified: Secondary | ICD-10-CM | POA: Diagnosis present

## 2015-02-09 DIAGNOSIS — R109 Unspecified abdominal pain: Secondary | ICD-10-CM | POA: Diagnosis present

## 2015-02-09 DIAGNOSIS — K91850 Pouchitis: Secondary | ICD-10-CM | POA: Diagnosis present

## 2015-02-09 DIAGNOSIS — J0121 Acute recurrent ethmoidal sinusitis: Secondary | ICD-10-CM | POA: Diagnosis not present

## 2015-02-09 DIAGNOSIS — K5080 Crohn's disease of both small and large intestine without complications: Secondary | ICD-10-CM | POA: Diagnosis present

## 2015-02-09 DIAGNOSIS — Z791 Long term (current) use of non-steroidal anti-inflammatories (NSAID): Secondary | ICD-10-CM

## 2015-02-09 DIAGNOSIS — M129 Arthropathy, unspecified: Secondary | ICD-10-CM | POA: Diagnosis present

## 2015-02-09 DIAGNOSIS — J45909 Unspecified asthma, uncomplicated: Secondary | ICD-10-CM | POA: Diagnosis present

## 2015-02-09 DIAGNOSIS — K5090 Crohn's disease, unspecified, without complications: Secondary | ICD-10-CM | POA: Diagnosis not present

## 2015-02-09 HISTORY — DX: Lesion of plantar nerve, right lower limb: G57.61

## 2015-02-09 HISTORY — DX: Intestinal adhesions (bands), unspecified as to partial versus complete obstruction: K56.50

## 2015-02-09 LAB — CBC WITH DIFFERENTIAL/PLATELET
Basophils Absolute: 0 10*3/uL (ref 0.0–0.1)
Basophils Relative: 0 % (ref 0–1)
EOS ABS: 0.1 10*3/uL (ref 0.0–0.7)
EOS PCT: 1 % (ref 0–5)
HCT: 39.4 % (ref 39.0–52.0)
HEMOGLOBIN: 12.2 g/dL — AB (ref 13.0–17.0)
LYMPHS ABS: 1.9 10*3/uL (ref 0.7–4.0)
Lymphocytes Relative: 18 % (ref 12–46)
MCH: 24.2 pg — ABNORMAL LOW (ref 26.0–34.0)
MCHC: 31 g/dL (ref 30.0–36.0)
MCV: 78.2 fL (ref 78.0–100.0)
Monocytes Absolute: 0.9 10*3/uL (ref 0.1–1.0)
Monocytes Relative: 9 % (ref 3–12)
Neutro Abs: 8 10*3/uL — ABNORMAL HIGH (ref 1.7–7.7)
Neutrophils Relative %: 72 % (ref 43–77)
PLATELETS: 362 10*3/uL (ref 150–400)
RBC: 5.04 MIL/uL (ref 4.22–5.81)
RDW: 15.7 % — ABNORMAL HIGH (ref 11.5–15.5)
WBC: 11 10*3/uL — AB (ref 4.0–10.5)

## 2015-02-09 LAB — COMPREHENSIVE METABOLIC PANEL
ALT: 16 U/L — ABNORMAL LOW (ref 17–63)
ANION GAP: 11 (ref 5–15)
AST: 20 U/L (ref 15–41)
Albumin: 4.2 g/dL (ref 3.5–5.0)
Alkaline Phosphatase: 71 U/L (ref 38–126)
BUN: 11 mg/dL (ref 6–20)
CHLORIDE: 102 mmol/L (ref 101–111)
CO2: 25 mmol/L (ref 22–32)
CREATININE: 0.9 mg/dL (ref 0.61–1.24)
Calcium: 9.7 mg/dL (ref 8.9–10.3)
GFR calc Af Amer: >60 mL/min (ref >60)
GLUCOSE: 98 mg/dL (ref 65–99)
Potassium: 3.5 mmol/L (ref 3.5–5.1)
Sodium: 138 mmol/L (ref 135–145)
Total Bilirubin: 1.7 mg/dL — ABNORMAL HIGH (ref 0.3–1.2)
Total Protein: 8.2 g/dL — ABNORMAL HIGH (ref 6.5–8.1)

## 2015-02-09 LAB — URINALYSIS, ROUTINE W REFLEX MICROSCOPIC
Bilirubin Urine: NEGATIVE
Glucose, UA: NEGATIVE mg/dL
Hgb urine dipstick: NEGATIVE
Ketones, ur: NEGATIVE mg/dL
LEUKOCYTES UA: NEGATIVE
NITRITE: NEGATIVE
Protein, ur: NEGATIVE mg/dL
SPECIFIC GRAVITY, URINE: 1.027 (ref 1.005–1.030)
UROBILINOGEN UA: 0.2 mg/dL (ref 0.0–1.0)
pH: 6 (ref 5.0–8.0)

## 2015-02-09 LAB — LIPASE, BLOOD: LIPASE: 24 U/L (ref 22–51)

## 2015-02-09 LAB — LACTIC ACID, PLASMA
LACTIC ACID, VENOUS: 0.9 mmol/L (ref 0.5–2.0)
Lactic Acid, Venous: 1 mmol/L (ref 0.5–2.0)

## 2015-02-09 MED ORDER — HYDROMORPHONE HCL 1 MG/ML IJ SOLN
1.0000 mg | Freq: Once | INTRAMUSCULAR | Status: AC
  Administered 2015-02-09: 1 mg via INTRAVENOUS
  Filled 2015-02-09: qty 1

## 2015-02-09 MED ORDER — ONDANSETRON HCL 4 MG/2ML IJ SOLN: 4.0000 mg | Freq: Four times a day (QID) | INTRAMUSCULAR | Status: DC | PRN

## 2015-02-09 MED ORDER — MOMETASONE FURO-FORMOTEROL FUM 100-5 MCG/ACT IN AERO
2.0000 | INHALATION_SPRAY | Freq: Two times a day (BID) | RESPIRATORY_TRACT | Status: DC
  Filled 2015-02-09: qty 8.8

## 2015-02-09 MED ORDER — LORAZEPAM 2 MG/ML IJ SOLN
1.0000 mg | Freq: Every evening | INTRAMUSCULAR | Status: DC | PRN
  Administered 2015-02-10: 1 mg via INTRAVENOUS
  Filled 2015-02-09: qty 1

## 2015-02-09 MED ORDER — METHYLPREDNISOLONE SODIUM SUCC 40 MG IJ SOLR
20.0000 mg | Freq: Every day | INTRAMUSCULAR | Status: DC
  Administered 2015-02-10: 20 mg via INTRAVENOUS
  Filled 2015-02-09: qty 0.5

## 2015-02-09 MED ORDER — MENTHOL 3 MG MT LOZG
1.0000 | LOZENGE | OROMUCOSAL | Status: DC | PRN
  Filled 2015-02-09: qty 9

## 2015-02-09 MED ORDER — LORAZEPAM 2 MG/ML IJ SOLN
1.0000 mg | INTRAMUSCULAR | Status: AC
  Administered 2015-02-09: 1 mg via INTRAVENOUS
  Filled 2015-02-09: qty 1

## 2015-02-09 MED ORDER — HYDROMORPHONE HCL 1 MG/ML IJ SOLN
1.0000 mg | INTRAMUSCULAR | Status: DC | PRN
  Administered 2015-02-09: 1 mg via INTRAVENOUS
  Filled 2015-02-09: qty 1

## 2015-02-09 MED ORDER — PREDNISONE 20 MG PO TABS: 20.0000 mg | ORAL_TABLET | Freq: Every morning | ORAL | Status: DC

## 2015-02-09 MED ORDER — CHLORHEXIDINE GLUCONATE 0.12 % MT SOLN
15.0000 mL | Freq: Two times a day (BID) | OROMUCOSAL | Status: DC
  Administered 2015-02-09 – 2015-02-10 (×2): 15 mL via OROMUCOSAL
  Filled 2015-02-09 (×4): qty 15

## 2015-02-09 MED ORDER — CETYLPYRIDINIUM CHLORIDE 0.05 % MT LIQD
7.0000 mL | Freq: Two times a day (BID) | OROMUCOSAL | Status: DC
  Administered 2015-02-10 (×2): 7 mL via OROMUCOSAL

## 2015-02-09 MED ORDER — PHENOL 1.4 % MT LIQD
1.0000 | OROMUCOSAL | Status: DC | PRN
  Administered 2015-02-09: 1 via OROMUCOSAL
  Filled 2015-02-09 (×2): qty 177

## 2015-02-09 MED ORDER — DEXTROSE-NACL 5-0.9 % IV SOLN
INTRAVENOUS | Status: DC
  Administered 2015-02-09: 1000 mL via INTRAVENOUS
  Administered 2015-02-09: 14:00:00 via INTRAVENOUS
  Administered 2015-02-10: 1000 mL via INTRAVENOUS

## 2015-02-09 MED ORDER — HYDROMORPHONE HCL 2 MG/ML IJ SOLN
2.0000 mg | INTRAMUSCULAR | Status: DC | PRN
  Administered 2015-02-09 – 2015-02-10 (×7): 2 mg via INTRAVENOUS
  Filled 2015-02-09 (×7): qty 1

## 2015-02-09 MED ORDER — ONDANSETRON HCL 4 MG/2ML IJ SOLN
4.0000 mg | Freq: Once | INTRAMUSCULAR | Status: AC
  Administered 2015-02-09: 4 mg via INTRAVENOUS
  Filled 2015-02-09: qty 2

## 2015-02-09 MED ORDER — HEPARIN SODIUM (PORCINE) 5000 UNIT/ML IJ SOLN
5000.0000 [IU] | Freq: Three times a day (TID) | INTRAMUSCULAR | Status: DC
  Administered 2015-02-09 – 2015-02-10 (×3): 5000 [IU] via SUBCUTANEOUS
  Filled 2015-02-09 (×6): qty 1

## 2015-02-09 MED ORDER — IOHEXOL 300 MG/ML  SOLN
100.0000 mL | Freq: Once | INTRAMUSCULAR | Status: AC | PRN
  Administered 2015-02-09: 100 mL via INTRAVENOUS

## 2015-02-09 MED ORDER — CEFTRIAXONE SODIUM 1 G IJ SOLR
1.0000 g | INTRAMUSCULAR | Status: DC
  Administered 2015-02-09: 1 g via INTRAVENOUS
  Filled 2015-02-09 (×2): qty 10

## 2015-02-09 MED ORDER — IOHEXOL 300 MG/ML  SOLN
50.0000 mL | Freq: Once | INTRAMUSCULAR | Status: AC | PRN
  Administered 2015-02-09: 50 mL via ORAL

## 2015-02-09 MED ORDER — PANTOPRAZOLE SODIUM 40 MG IV SOLR
40.0000 mg | INTRAVENOUS | Status: DC
  Administered 2015-02-09: 40 mg via INTRAVENOUS
  Filled 2015-02-09 (×2): qty 40

## 2015-02-09 MED ORDER — ONDANSETRON HCL 4 MG PO TABS: 4.0000 mg | ORAL_TABLET | Freq: Four times a day (QID) | ORAL | Status: DC | PRN

## 2015-02-09 NOTE — ED Notes (Signed)
Patient transported to CT 

## 2015-02-09 NOTE — ED Provider Notes (Signed)
CSN: [id number]     Arrival date & time 02/09/15  1017 History   First MD Initiated Contact with Patient 02/09/15 1028     Chief Complaint  Patient presents with  . possible bowel obx, abd pain      (Consider location/radiation/quality/duration/timing/severity/associated sxs/prior Treatment) Patient is a 44 y.o. male presenting with abdominal pain.  Abdominal Pain Pain location:  Generalized Pain quality: cramping and sharp   Pain radiates to:  Does not radiate Pain severity:  Severe Onset quality:  Gradual Duration:  10 hours Timing:  Constant Progression:  Unchanged Chronicity:  New Context comment:  Ho crohns, prior SBO Relieved by:  Nothing Worsened by:  Movement and palpation Associated symptoms: chills, constipation, diarrhea, nausea and vomiting   Associated symptoms: no anorexia, no dysuria and no fever     Past Medical History  Diagnosis Date  . Incisional hernia   . Osteopenia   . Arthropathy     and scroilitis  . Thyroid disease   . Chronic drug-induced interstitial lung disorders   . IBD (inflammatory bowel disease)   . Herniated disc   . Vitamin D deficiency   . Dysplastic nevi   . Migraine headache   . Crohn's ileocolitis 08/29/2008  . Pouchitis 2006  . History of proctitis 2006  . Morton's neuroma of right foot    Past Surgical History  Procedure Laterality Date  . Colectomy    . Ileoanal pull-through    . Functional endoscopic sinus surgery    . Colonoscopy w/ biopsies  04/16/2006    crohn's colitis  . Flexible sigmoidoscopy  2006 - 02/2014    ileocolitis, pouchitis.  with puch ulcer.   . Sacroilitis    . Sigmoidoscopy  01/2005  . Ventral hernia repair  06/2014   Family History  Problem Relation Age of Onset  . Colon cancer      grandmother  . Ulcerative colitis Mother    History  Substance Use Topics  . Smoking status: Never Smoker   . Smokeless tobacco: Never Used  . Alcohol Use: 8.4 oz/week    14 Glasses of wine per week      Comment: couple of drinks daily    Review of Systems  Constitutional: Positive for chills. Negative for fever.  Gastrointestinal: Positive for nausea, vomiting, abdominal pain, diarrhea and constipation. Negative for anorexia.  Genitourinary: Negative for dysuria.  All other systems reviewed and are negative.     Allergies  Mercaptopurine  Home Medications   Prior to Admission medications   Medication Sig Start Date End Date Taking? Authorizing Provider  albuterol (PROVENTIL HFA;VENTOLIN HFA) 108 (90 BASE) MCG/ACT inhaler Inhale 2 puffs into the lungs every 6 (six) hours as needed. Patient taking differently: Inhale 1-2 puffs into the lungs every 6 (six) hours as needed for wheezing or shortness of breath.  01/21/13  Yes Dorena Cookey, MD  ANUSOL-HC 25 MG suppository Place 1 suppository (25 mg total) rectally at bedtime as needed for hemorrhoids or itching. 01/04/15  Yes Gatha Mayer, MD  cephALEXin (KEFLEX) 500 MG capsule Take 1,000 mg by mouth 2 (two) times daily. He is to take for 10 days. He started on 02/02/15 and has not completed.   Yes Historical Provider, MD  dexlansoprazole (DEXILANT) 60 MG capsule Take 60 mg by mouth daily.  06/09/13  Yes Historical Provider, MD  diphenoxylate-atropine (LOMOTIL) 2.5-0.025 MG per tablet Take 1 tablet by mouth 4 (four) times daily as needed for diarrhea or loose stools (  before meals and at bedtime if needed). 08/31/14  Yes Gatha Mayer, MD  HUMIRA PEN 40 MG/0.8ML PNKT Inject 40 mg into the skin every 14 (fourteen) days. 12/20/14  Yes Historical Provider, MD  ibuprofen (ADVIL,MOTRIN) 200 MG tablet Take 400 mg by mouth every 6 (six) hours as needed (For pain.).    Yes Historical Provider, MD  LORazepam (ATIVAN) 1 MG tablet Take 1 mg by mouth at bedtime as needed for anxiety or sleep.   Yes Historical Provider, MD  mometasone-formoterol (DULERA) 100-5 MCG/ACT AERO Inhale 2 puffs into the lungs daily. Inhale 2 puffs into the lungs 2 times  daily Patient taking differently: Inhale 2 puffs into the lungs 2 (two) times daily.  09/20/14  Yes Dorena Cookey, MD  AMBULATORY NON FORMULARY MEDICATION Medication Name: generic mesalamine enema 4 gram Insert one rectally nightly Patient not taking: Reported on 02/09/2015 08/02/14   Laban Emperor Zehr, PA-C  Probiotic Product (VSL#3 DS) PACK Take 1 each by mouth daily. Patient not taking: Reported on 02/09/2015 12/22/14   Gatha Mayer, MD   BP 132/80 mmHg  Pulse 68  Temp(Src) 97.8 F (36.6 C) (Oral)  Resp 18  Ht 5' 10"  (1.778 m)  Wt 155 lb 3.3 oz (70.4 kg)  BMI 22.27 kg/m2  SpO2 100% Physical Exam  Constitutional: He is oriented to person, place, and time. He appears well-developed and well-nourished.  HENT:  Head: Normocephalic and atraumatic.  Eyes: Conjunctivae and EOM are normal.  Neck: Normal range of motion. Neck supple.  Cardiovascular: Normal rate, regular rhythm and normal heart sounds.   Pulmonary/Chest: Effort normal and breath sounds normal. No respiratory distress.  Abdominal: He exhibits no distension. There is generalized tenderness. There is no rebound and no guarding.  Musculoskeletal: Normal range of motion.  Neurological: He is alert and oriented to person, place, and time.  Skin: Skin is warm and dry.  Vitals reviewed.   ED Course  Procedures (including critical care time) Labs Review Labs Reviewed  CBC WITH DIFFERENTIAL/PLATELET - Abnormal; Notable for the following:    WBC 11.0 (*)    Hemoglobin 12.2 (*)    MCH 24.2 (*)    RDW 15.7 (*)    Neutro Abs 8.0 (*)    All other components within normal limits  COMPREHENSIVE METABOLIC PANEL - Abnormal; Notable for the following:    Total Protein 8.2 (*)    ALT 16 (*)    Total Bilirubin 1.7 (*)    All other components within normal limits  BASIC METABOLIC PANEL - Abnormal; Notable for the following:    Glucose, Bld 109 (*)    Calcium 8.8 (*)    All other components within normal limits  LIPASE, BLOOD   LACTIC ACID, PLASMA  LACTIC ACID, PLASMA  URINALYSIS, ROUTINE W REFLEX MICROSCOPIC (NOT AT [facility])    Imaging Review Ct Abdomen Pelvis W Contrast  02/09/2015   CLINICAL DATA:  History of Crohn's disease status post total colectomy. Abdominal pain and vomiting since 4 a.m.  EXAM: CT ABDOMEN AND PELVIS WITH CONTRAST  TECHNIQUE: Multidetector CT imaging of the abdomen and pelvis was performed using the standard protocol following bolus administration of intravenous contrast.  CONTRAST:  40m OMNIPAQUE IOHEXOL 300 MG/ML SOLN, 1063mOMNIPAQUE IOHEXOL 300 MG/ML SOLN  COMPARISON:  None.  FINDINGS: Lower chest: The lung bases are clear except for dependent subpleural atelectasis. A heart is normal in size. No pericardial effusion.  Hepatobiliary: No focal hepatic lesions or intrahepatic biliary dilatation.  The gallbladder is normal. No common bowel duct dilatation.  Pancreas: No mass, inflammation or ductal dilatation.  Spleen: Normal size.  No focal lesions.  Adrenals/Urinary Tract: The adrenal glands and kidneys are normal. The bladder is normal. The prostate gland and seminal vesicles are unremarkable.  Stomach/Bowel: Dilated mid distal small bowel loops with air-fluid levels consistent with obstruction. The patient has had a total colectomy. There is moderate wall thickening and mucosal enhancement involving the neo rectum. This could reflect recurrent Crohn's disease. Slightly angulated decompressed small bowel loops are noted in the mid pelvis near surgical clips. This is likely an area of adhesions likely causing the small bowel obstruction.  Vascular/Lymphatic: No mesenteric or retroperitoneal mass or adenopathy. The aorta and branch vessels are patent. The major venous structures are patent.  Other: Knee bladder, prostate gland and seminal vesicles are unremarkable. No pelvic mass or adenopathy. No free pelvic fluid collections. There is small amount of fluid around the liver.  Musculoskeletal: No  significant bony findings.  IMPRESSION: Small bowel obstruction likely due to adhesions in the mid central pelvis with caliber change and small bowel loops.  History of Crohn's disease status post total colectomy. There is moderate wall thickening of the neorectum.   Electronically Signed   By: Marijo Sanes M.D.   On: 02/09/2015 13:31   Dg Abd 2 Views  02/10/2015   CLINICAL DATA:  Follow-up small bowel obstruction. Subsequent encounter.  EXAM: ABDOMEN - 2 VIEW  COMPARISON:  CT of the abdomen and pelvis from 02/09/2015  FINDINGS: The patient's enteric tube is noted ending overlying the body of the stomach.  Previously noted small bowel dilatation has apparently resolved, with air seen at the residual colon and distal ileum within the lower abdomen and pelvis. The bowel gas pattern is within normal limits. The stomach is relatively decompressed, containing a small amount of air and solid material. Clips are seen overlying the left lower quadrant. No free intra-abdominal air is seen on the provided decubitus view.  The visualized lung bases are clear. No acute osseous abnormalities are identified. Minimal degenerative change is noted at the lower lumbar spine.  IMPRESSION: 1. Small bowel dilatation has apparently resolved, with air seen at the residual colon and distal ileum within the lower abdomen and pelvis. Visualized bowel gas pattern is within normal limits. No free intra-abdominal air seen. 2. Enteric tube noted ending overlying the body of the stomach.   Electronically Signed   By: Garald Balding M.D.   On: 02/10/2015 06:49     EKG Interpretation None      MDM   Final diagnoses:  Abdominal pain    44 y.o. male with pertinent PMH of crohns presents with recurrent acute on chronic abd pain as above.  No fevers.  Physical exam as above.  CT scan with likely SBO.  Consulted his primary GI who admitted the patient.   I have reviewed all laboratory and imaging studies if ordered as above  1.  Abdominal pain   2. Small bowel obstruction due to adhesions   3. SBO (small bowel obstruction)         Debby Freiberg, MD 02/10/15 [id number]

## 2015-02-09 NOTE — Consult Note (Signed)
Charles [name] August 26, 1970  [id number].   Primary Care MD: Dr. Christie Nottingham Requesting MD: Dr. Silvano Rusk Chief Complaint/Reason for Consult: SBO HPI: Pleasant 44 yo white male who is the husband of one of the CRNAs at [facility].  He has a complex surgical history, s/p subtotal colectomy with end ileostomy, subsequent takedown with J-pouch and loop ileostomy and ultimately takedown.  This was done in [place] in 2004.  He was thought to have UC, but later determined that this was likely crohn's disease and is now being treated with Humira for crohn's disease.  In 2014 at [facility], he underwent a LOA and incisional hernia repair.  The patient has done well with all of this.    Last night around 2200pm he developed abdominal pain.  He was hurting enough that he could not sleep most of the night and was pacing around.  He did throw up once this morning.  He came to the [facility] ED where he had a CT scan that reveals a SBO likely secondary to adhesive disease as there does not appear to be any active crohn's disease or wall thickening in this area.  He last had flatus around 0200am.  He did try to give himself an enema last night, but this did not result in anything.  GI is admitting him and we have been asked to see him for further recommendations.   ROS : Please see HPI, otherwise negative, except for a current sinus infection.  He has taken 2 days of the abx and has taken a couple of days of his steroid taper pack as well.  Family History  Problem Relation Age of Onset  . Colon cancer      grandmother  . Ulcerative colitis Mother     Past Medical History  Diagnosis Date  . Incisional hernia   . Osteopenia   . Arthropathy     and scroilitis  . Thyroid disease   . Chronic drug-induced interstitial lung disorders   . IBD (inflammatory bowel disease)   . Herniated disc   . Vitamin D deficiency   . Dysplastic nevi   . Migraine headache   . Crohn's ileocolitis 08/29/2008  . Pouchitis 2006  . History of proctitis  2006  . Morton's neuroma of right foot     Past Surgical History  Procedure Laterality Date  . Colectomy    . Ileoanal pull-through    . Functional endoscopic sinus surgery    . Colonoscopy w/ biopsies  04/16/2006    crohn's colitis  . Flexible sigmoidoscopy  2006 - 02/2014    ileocolitis, pouchitis.  with puch ulcer.   . Sacroilitis    . Sigmoidoscopy  01/2005  . Ventral hernia repair  06/2014    Social History:  reports that he has never smoked. He has never used smokeless tobacco. He reports that he drinks about 8.4 oz of alcohol per week. He reports that he does not use illicit drugs.  Allergies:  Allergies  Allergen Reactions  . Mercaptopurine Nausea And Vomiting    Felt ill within 2 days of initiating therapy.     (Not in a hospital admission)  Blood pressure 137/76, pulse 60, temperature 98.1 F (36.7 C), temperature source Oral, resp. rate 18, SpO2 98 %. Physical Exam: General: pleasant, WD, WN white male who is laying in bed in NAD HEENT: head is normocephalic, atraumatic.  Sclera are noninjected.  PERRL.  Ears and nose without any masses or lesions.  Mouth is pink and  moist Heart: regular, rate, and rhythm.  Normal s1,s2. No obvious murmurs, gallops, or rubs noted.  Palpable radial and pedal pulses bilaterally Lungs: CTAB, no wheezes, rhonchi, or rales noted.  Respiratory effort nonlabored Abd: soft, diffuse tenderness, but greatest in central midabdomen, distended,  +BS, no masses, hernias, or organomegaly, multiple scars are noted from his prior surgeries MS: all 4 extremities are symmetrical with no cyanosis, clubbing, or edema. Skin: warm and dry with no masses, lesions, or rashes Psych: A&Ox3 with an appropriate affect.    Results for orders placed or performed during the hospital encounter of 02/09/15 (from the past 48 hour(s))  CBC with Differential     Status: Abnormal   Collection Time: 02/09/15 10:53 AM  Result Value Ref Range   WBC 11.0 (H) 4.0 - 10.5  K/uL   RBC 5.04 4.22 - 5.81 MIL/uL   Hemoglobin 12.2 (L) 13.0 - 17.0 g/dL   HCT 39.4 39.0 - 52.0 %   MCV 78.2 78.0 - 100.0 fL   MCH 24.2 (L) 26.0 - 34.0 pg   MCHC 31.0 30.0 - 36.0 g/dL   RDW 15.7 (H) 11.5 - 15.5 %   Platelets 362 150 - 400 K/uL   Neutrophils Relative % 72 43 - 77 %   Neutro Abs 8.0 (H) 1.7 - 7.7 K/uL   Lymphocytes Relative 18 12 - 46 %   Lymphs Abs 1.9 0.7 - 4.0 K/uL   Monocytes Relative 9 3 - 12 %   Monocytes Absolute 0.9 0.1 - 1.0 K/uL   Eosinophils Relative 1 0 - 5 %   Eosinophils Absolute 0.1 0.0 - 0.7 K/uL   Basophils Relative 0 0 - 1 %   Basophils Absolute 0.0 0.0 - 0.1 K/uL  Comprehensive metabolic panel     Status: Abnormal   Collection Time: 02/09/15 10:53 AM  Result Value Ref Range   Sodium 138 135 - 145 mmol/L   Potassium 3.5 3.5 - 5.1 mmol/L   Chloride 102 101 - 111 mmol/L   CO2 25 22 - 32 mmol/L   Glucose, Bld 98 65 - 99 mg/dL   BUN 11 6 - 20 mg/dL   Creatinine, Ser 0.90 0.61 - 1.24 mg/dL   Calcium 9.7 8.9 - 10.3 mg/dL   Total Protein 8.2 (H) 6.5 - 8.1 g/dL   Albumin 4.2 3.5 - 5.0 g/dL   AST 20 15 - 41 U/L   ALT 16 (L) 17 - 63 U/L   Alkaline Phosphatase 71 38 - 126 U/L   Total Bilirubin 1.7 (H) 0.3 - 1.2 mg/dL   GFR calc non Af Amer >60 >60 mL/min   GFR calc Af Amer >60 >60 mL/min    Comment: (NOTE) The eGFR has been calculated using the CKD EPI equation. This calculation has not been validated in all clinical situations. eGFR's persistently <60 mL/min signify possible Chronic Kidney Disease.    Anion gap 11 5 - 15  Lipase, blood     Status: None   Collection Time: 02/09/15 10:53 AM  Result Value Ref Range   Lipase 24 22 - 51 U/L  Lactic acid, plasma     Status: None   Collection Time: 02/09/15 10:53 AM  Result Value Ref Range   Lactic Acid, Venous 1.0 0.5 - 2.0 mmol/L  Urinalysis, Routine w reflex microscopic (not at [facility])     Status: None   Collection Time: 02/09/15  1:21 PM  Result Value Ref Range   Color, Urine YELLOW YELLOW  APPearance CLEAR CLEAR   Specific Gravity, Urine 1.027 1.005 - 1.030   pH 6.0 5.0 - 8.0   Glucose, UA NEGATIVE NEGATIVE mg/dL   Hgb urine dipstick NEGATIVE NEGATIVE   Bilirubin Urine NEGATIVE NEGATIVE   Ketones, ur NEGATIVE NEGATIVE mg/dL   Protein, ur NEGATIVE NEGATIVE mg/dL   Urobilinogen, UA 0.2 0.0 - 1.0 mg/dL   Nitrite NEGATIVE NEGATIVE   Leukocytes, UA NEGATIVE NEGATIVE    Comment: MICROSCOPIC NOT DONE ON URINES WITH NEGATIVE PROTEIN, BLOOD, LEUKOCYTES, NITRITE, OR GLUCOSE <1000 mg/dL.  Lactic acid, plasma     Status: None   Collection Time: 02/09/15  1:51 PM  Result Value Ref Range   Lactic Acid, Venous 0.9 0.5 - 2.0 mmol/L   Ct Abdomen Pelvis W Contrast  02/09/2015   CLINICAL DATA:  History of Crohn's disease status post total colectomy. Abdominal pain and vomiting since 4 a.m.  EXAM: CT ABDOMEN AND PELVIS WITH CONTRAST  TECHNIQUE: Multidetector CT imaging of the abdomen and pelvis was performed using the standard protocol following bolus administration of intravenous contrast.  CONTRAST:  31m OMNIPAQUE IOHEXOL 300 MG/ML SOLN, 1060mOMNIPAQUE IOHEXOL 300 MG/ML SOLN  COMPARISON:  None.  FINDINGS: Lower chest: The lung bases are clear except for dependent subpleural atelectasis. A heart is normal in size. No pericardial effusion.  Hepatobiliary: No focal hepatic lesions or intrahepatic biliary dilatation. The gallbladder is normal. No common bowel duct dilatation.  Pancreas: No mass, inflammation or ductal dilatation.  Spleen: Normal size.  No focal lesions.  Adrenals/Urinary Tract: The adrenal glands and kidneys are normal. The bladder is normal. The prostate gland and seminal vesicles are unremarkable.  Stomach/Bowel: Dilated mid distal small bowel loops with air-fluid levels consistent with obstruction. The patient has had a total colectomy. There is moderate wall thickening and mucosal enhancement involving the neo rectum. This could reflect recurrent Crohn's disease. Slightly angulated  decompressed small bowel loops are noted in the mid pelvis near surgical clips. This is likely an area of adhesions likely causing the small bowel obstruction.  Vascular/Lymphatic: No mesenteric or retroperitoneal mass or adenopathy. The aorta and branch vessels are patent. The major venous structures are patent.  Other: Knee bladder, prostate gland and seminal vesicles are unremarkable. No pelvic mass or adenopathy. No free pelvic fluid collections. There is small amount of fluid around the liver.  Musculoskeletal: No significant bony findings.  IMPRESSION: Small bowel obstruction likely due to adhesions in the mid central pelvis with caliber change and small bowel loops.  History of Crohn's disease status post total colectomy. There is moderate wall thickening of the neorectum.   Electronically Signed   By: P.Marijo Sanes.D.   On: 02/09/2015 13:31       Assessment/Plan 1. 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 -repeat abdominal films in the morning.   -given the patient doesn't have a colon, I'm not sure if the SBO protocol is going to be appropriate for this patient.  Will check into that. -hopefully he will resolve with conservative management.  If he does not and requires surgery, he will definitely be complex, given his J-pouch and multiple previous operations.  Our colorectal specialist is actually the Doctor of the Week if he did not improve with conservative management.  -we will follow along.  Thank you for this consultation.  Harmonii Karle E 02/09/2015, 3:28 PM Pager: [phone]

## 2015-02-09 NOTE — ED Notes (Signed)
Pt reports hx of Crohns, total colectomy surgery in past. Pt reports abd pain starting last night, gave himself enema at 0400 with no relief. Vomited x1 this morning. Small bowel movement this morning, but not normal bowel movement. Pt called GI doctor who suspects bowel obstruction. Pt on abx and prednisone for sinus infection. abd pain 7/10 at present. Denies dysuria.

## 2015-02-09 NOTE — Consult Note (Signed)
[name] Consult: 1:45 PM 02/09/2015     Referring Provider: ED Physician Dr Charles [name] [name].     Primary Care Physician:  Charles [name] [name], Charles [name] Primary Gastroenterologist:  Dr. Carlean [name].  Dr Charles [name] [name] of [place].  ENT :  Dr Charles [name] [name].   Reason for Consultation:  Abdominal pain and nausea.    HPI: Charles [name] [name] is a 44 y.o. male.  Pt's father is a pulmonologist, his wife is a Immunologist, his father in law is Dr Charles [name] [name] of [place].  PMH Crohns ileo colitis,  S/p colectomy with ileoanal pull-through/J pouch 2004. S/p ventral/incisional hernia repair,LOA with mesh 06/2013 at [facility].  Osteopenia.  Asthma.  Sinusitis.  S/p nasal/sinus surgery   S/p removal of several dysplastic melanocytic nevi 2013 - 2015. Morton's neuroma.  Weight 155#on 12/22/14 On Humira, previously on Remicade. On VSL probiotic.  Course of Keflex and prednisone for sinus infection 1 month ago.  Started back on prednisone 7 days ago, and then Keflex 4days ago for recurrent sinusitis.   Developed low to mid, bil abdominal pain last night, this AM vomited food material from yesterday's lunch.  Feels like he has a bowel obstrucition.   Normally has 4 to 7 loose BMs daily, no blood.  Yesterday had fewer BMs which he attributed to his being sedentary in a car for many hours.  Pt gave himself an enema but no stool resulted. Took antacid for heartburn.  Called Charles [name] and advised to come to ED.  In ED received Dilaudid and Zofran which helped.  CT confirms SBO.  Previous Endoscopic studies 02/17/2014 "Flex Sig" 2 small ulcers in distal pouch, remainder or pouch and ileum to 100cm grossly normal. Path: chronic, minimally active colitis.  Per Dr Charles [name] [name]: "Minimal inflammation "colitis" in I-A pouch.  Seems to be doing well.  I would lean toward not treating  right now but will see what Dr. Jeanne [name] says". 05/2012 "flex sig" ulcer in pouch. Path: ulcerated ileal pouchitis.   08/2008 "Flex Sig".  1) Mild inflammation in anal pouch suspected, await biopsies.  2) Otherwise normal exam, s/p ileoanal pull-through and colectomy. Path:minimally active ileocolitis.  2006 Flex sig/colonoscopy: moderately severe pouchitis, inflammatory polyp, mild stricture at anastomosis.      Past Medical History  Diagnosis Date  . Incisional hernia   . Osteopenia   . Arthropathy     and scroilitis  . Thyroid disease   . Chronic drug-induced interstitial lung disorders   . IBD (inflammatory bowel disease)   . Herniated  disc   . Vitamin D deficiency   . Dysplastic nevi   . Migraine headache   . Crohn's ileocolitis 08/29/2008  . Pouchitis 2006  . History of proctitis 2006  . Morton's neuroma of right foot     Past Surgical History  Procedure Laterality Date  . Colectomy    . Ileoanal pull-through    . Functional endoscopic sinus surgery    . Colonoscopy w/ biopsies  04/16/2006    crohn's colitis  . Flexible sigmoidoscopy  2006 - 02/2014    ileocolitis, pouchitis.  with puch ulcer.   . Sacroilitis    . Sigmoidoscopy  01/2005  . Ventral hernia repair  06/2014    Prior to Admission medications   Medication Sig Start Date End Date Taking? Authorizing Provider  albuterol (PROVENTIL HFA;VENTOLIN HFA) 108 (90 BASE) MCG/ACT inhaler Inhale 2 puffs into the lungs every 6 (six) hours as needed. Patient taking differently: Inhale 1-2 puffs into the lungs every 6 (six) hours as needed for wheezing or shortness of breath.  01/21/13  Yes Charles [name] [name], Charles [name]  ANUSOL-HC 25 MG suppository Place 1 suppository (25 mg total) rectally at bedtime as needed for hemorrhoids or itching. 01/04/15  Yes Charles [name] [name], Charles [name]  cephALEXin (KEFLEX) 500 MG capsule Take 1,000 mg by mouth 2 (two) times daily. He is to take for 10 days. He started on 02/02/15 and has not completed.   Yes  Historical Provider, Charles [name]  dexlansoprazole (DEXILANT) 60 MG capsule Take 60 mg by mouth daily.  06/09/13  Yes Historical Provider, Charles [name]  diphenoxylate-atropine (LOMOTIL) 2.5-0.025 MG per tablet Take 1 tablet by mouth 4 (four) times daily as needed for diarrhea or loose stools (before meals and at bedtime if needed). 08/31/14  Yes Charles [name] [name], Charles [name]  HUMIRA PEN 40 MG/0.8ML PNKT Inject 40 mg into the skin every 14 (fourteen) days. 12/20/14  Yes Historical Provider, Charles [name]  ibuprofen (ADVIL,MOTRIN) 200 MG tablet Take 400 mg by mouth every 6 (six) hours as needed (For pain.).    Yes Historical Provider, Charles [name]  LORazepam (ATIVAN) 1 MG tablet Take 1 mg by mouth at bedtime as needed for anxiety or sleep.   Yes Historical Provider, Charles [name]  mometasone-formoterol (DULERA) 100-5 MCG/ACT AERO Inhale 2 puffs into the lungs daily. Inhale 2 puffs into the lungs 2 times daily Patient taking differently: Inhale 2 puffs into the lungs 2 (two) times daily.  09/20/14  Yes Charles [name] [name], Charles [name]  AMBULATORY NON FORMULARY MEDICATION Medication Name: generic mesalamine enema 4 gram Insert one rectally nightly Patient not taking: Reported on 02/09/2015 08/02/14   Charles Emperor Zehr, PA-C  Probiotic Product (VSL#3 DS) PACK Take 1 each by mouth daily. Patient not taking: Reported on 02/09/2015 12/22/14   Charles [name] [name], Charles [name]    Scheduled Meds:  Infusions:  PRN Meds:    Allergies as of 02/09/2015 - Review Complete 02/09/2015  Allergen Reaction Noted  . Mercaptopurine Nausea And Vomiting 02/17/2014    Family History  Problem Relation Age of Onset  . Colon cancer      grandmother  . Ulcerative colitis Mother     History   Social History  . Marital Status: Married    Spouse Name: N/A  . Number of Children: 2  . 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 Smoking status: Never Smoker   . Smokeless tobacco: Never Used  . Alcohol Use: 0.6 oz/week  1 Glasses of wine per week      Comment: once daily  . Drug Use: No  . Sexual Activity: Not on file   Other Topics Concern  . Not on file   Social History Narrative   Father a pulmonologist in [place].   Married to Dr. Honor [name] daughter.   Wife is CRNA    REVIEW OF SYSTEMS: Constitutional:  Stable weight.  Regular aggressive excerciser.  ENT:  No nose bleeds, + nasal congestion/pressure and discharge Pulm:  No COB, some cough with this sinus drainage CV:  No palpitations, no LE edema.  GU:  No hematuria, no frequency GI:  Per HPI Heme:  No unusual bleeding or bruising.    Transfusions:  none Neuro:  No headaches, no peripheral tingling or numbness Derm:  No itching, no rash or sores.  Endocrine:  No sweats or chills.  No polyuria or dysuria Immunization:  Reviewed.  Multiple shots up to date including Hep A and B, flu, pneumovax, MMR, tdap.    PHYSICAL EXAM: Vital signs in last 24 hours: Filed Vitals:   02/09/15 1129  BP: 147/87  Pulse: 84  Temp:   Resp: 18   Wt Readings from Last 3 Encounters:  01/02/15 156 lb (70.761 kg)  12/22/14 155 lb 9.6 oz (70.58 kg)  12/06/14 159 lb (72.122 kg)    General: pleasant, well appearing, lean WM.  comfortable Head:  No swelling or asymmetry.  Eyes:  No icterus or pallor Ears:  Not HOH  Nose:  No discharge, deviated  Mouth:  Clear , moist, good teeth Neck:  No mass, no JVD Lungs:  Clear bil. Slight cough.  No labored breathing Heart: RRR Abdomen:  Thin, tender on right without mass or guarding.  No rebound.  No tinkling or tympanitic BS.  No mass or HSM.  No hernias.   Rectal: deferred   Musc/Skeltl: no joint erythema, swelling or deformity Extremities:  No CCE  Neurologic:  Oriented x 3.  No limb weakness.   Skin:  No rash, sores or telangectasia.  Tattoos:  none Nodes:  No cervical adenopathy.    Psych:  Slightly anxious.   Intake/Output from previous day:   Intake/Output this shift:    LAB RESULTS:  Recent Labs  02/09/15 1053  WBC 11.0*    HGB 12.2*  HCT 39.4  PLT 362   BMET Lab Results  Component Value Date   NA 138 02/09/2015   NA 136 08/31/2014   NA 135 08/02/2014   K 3.5 02/09/2015   K 4.1 08/31/2014   K 3.9 08/02/2014   CL 102 02/09/2015   CL 99 08/31/2014   CL 98 08/02/2014   CO2 25 02/09/2015   CO2 30 08/31/2014   CO2 30 08/02/2014   GLUCOSE 98 02/09/2015   GLUCOSE 95 08/31/2014   GLUCOSE 109* 08/02/2014   BUN 11 02/09/2015   BUN 16 08/31/2014   BUN 14 08/02/2014   CREATININE 0.90 02/09/2015   CREATININE 0.99 08/31/2014   CREATININE 1.0 08/02/2014   CALCIUM 9.7 02/09/2015   CALCIUM 9.5 08/31/2014   CALCIUM 9.4 08/02/2014   LFT  Recent Labs  02/09/15 1053  PROT 8.2*  ALBUMIN 4.2  AST 20  ALT 16*  ALKPHOS 71  BILITOT 1.7*   PT/INR No results found for: INR, PROTIME Hepatitis Panel No results for input(s): HEPBSAG, HCVAB, HEPAIGM, HEPBIGM in the last 72 hours. C-Diff No components found for: CDIFF Lipase     Component Value Date/Time  LIPASE 24 02/09/2015 1053    Drugs of Abuse  No results found for: LABOPIA, COCAINSCRNUR, LABBENZ, AMPHETMU, THCU, LABBARB   RADIOLOGY STUDIES: Ct Abdomen Pelvis W Contrast  02/09/2015   CLINICAL DATA:  History of Crohn's disease status post total colectomy. Abdominal pain and vomiting since 4 a.m.  EXAM: CT ABDOMEN AND PELVIS WITH CONTRAST  TECHNIQUE: Multidetector CT imaging of the abdomen and pelvis was performed using the standard protocol following bolus administration of intravenous contrast.  CONTRAST:  20m OMNIPAQUE IOHEXOL 300 MG/ML SOLN, 1064mOMNIPAQUE IOHEXOL 300 MG/ML SOLN  COMPARISON:  None.  FINDINGS: Lower chest: The lung bases are clear except for dependent subpleural atelectasis. A heart is normal in size. No pericardial effusion.  Hepatobiliary: No focal hepatic lesions or intrahepatic biliary dilatation. The gallbladder is normal. No common bowel duct dilatation.  Pancreas: No mass, inflammation or ductal dilatation.  Spleen: Normal  size.  No focal lesions.  Adrenals/Urinary Tract: The adrenal glands and kidneys are normal. The bladder is normal. The prostate gland and seminal vesicles are unremarkable.  Stomach/Bowel: Dilated mid distal small bowel loops with air-fluid levels consistent with obstruction. The patient has had a total colectomy. There is moderate wall thickening and mucosal enhancement involving the neo rectum. This could reflect recurrent Crohn's disease. Slightly angulated decompressed small bowel loops are noted in the mid pelvis near surgical clips. This is likely an area of adhesions likely causing the small bowel obstruction.  Vascular/Lymphatic: No mesenteric or retroperitoneal mass or adenopathy. The aorta and branch vessels are patent. The major venous structures are patent.  Other: Knee bladder, prostate gland and seminal vesicles are unremarkable. No pelvic mass or adenopathy. No free pelvic fluid collections. There is small amount of fluid around the liver.  Musculoskeletal: No significant bony findings.  IMPRESSION: Small bowel obstruction likely due to adhesions in the mid central pelvis with caliber change and small bowel loops.  History of Crohn's disease status post total colectomy. There is moderate wall thickening of the neorectum.   Electronically Signed   By: P.Marijo Sanes.D.   On: 02/09/2015 13:31     IMPRESSION:   *  Abd pain with n/v in pt with hx crohn's ileocolitis, s/p total colectomy with ileoanal pull through/j pouch.   Ct c/w SBO.   *  Recurrent sinusitis, MDs treating with prednisone and Keflex.  Had course of same meds 1 month ago for sinusitis.    PLAN:     *  Needs admission. Orders written.  *  spoke with hospitalist re treating sinusitis:  Will give Rocephin.  Continue oral prednisone and clamp tube x 1 hour after taking.  Did not ask hospitalist to follow along.  *  Consider ENT consult tomorrow.    *  Surgery to see pt in consult.    SaAzucena Freed6/23/2016, 1:45  PM Pager: [phone] Ha dn P  CaGatha MayerMD, FALutheran Hospitalastroenterology [phone]) 02/09/2015 6:02 PM

## 2015-02-09 NOTE — ED Notes (Signed)
Pt aware urine specimen is needed. Cannot use restroom at this time

## 2015-02-09 NOTE — H&P (Signed)
[facility] Admission H&P 1:45 PM 02/09/2015      Primary Care Physician:  Joycelyn Man, MD Primary Gastroenterologist:  Dr. Carlean Purl.   ENT :  Dr Wilburn Cornelia.   Reason for admission:  Abdominal pain, nausea: SBO    HPI: Charles [name] is a 44 y.o. male.  Pt's father is a pulmonologist, his wife is a Immunologist, his father in law is Dr Stevie Kern of [employer].  PMH Crohns ileo colitis,  S/p colectomy with ileoanal pull-through/J pouch 2004. S/p ventral/incisional hernia repair,LOA with mesh 06/2013 at [facility].  Osteopenia.  Asthma.  Sinusitis.  S/p nasal/sinus surgery   S/p removal of several dysplastic melanocytic nevi 2013 - 2015. Morton's neuroma.  Weight 155#on 12/22/14 On Humira every 2 weeks, previously on Remicade. On VSL probiotic. Last took Humira on 6/12, next dose due 6/26.  Course of Keflex and prednisone for sinus infection 1 month ago.  Started back on prednisone 7 days ago, and then Keflex 4days ago for recurrent sinusitis.   Developed low to mid, bil abdominal pain last night, this AM vomited food material from yesterday's lunch.  Feels like he has a bowel obstrucition.   Normally has 4 to 7 loose BMs daily, no blood.  Yesterday had fewer BMs which he attributed to his being sedentary in a car for many hours.  Pt gave himself an enema but no stool resulted. Took antacid for heartburn.  Called MD and advised to come to ED.  In ED received Dilaudid and Zofran which helped.  CT confirms SBO.  Previous Endoscopic studies 02/17/2014 "Flex Sig" 2 small ulcers in distal pouch, remainder or pouch and ileum to 100cm grossly normal. Path: chronic, minimally active colitis.  Per Dr Carlean Purl: "Minimal inflammation "colitis" in I-A pouch.  Seems to be doing well.  I would lean toward not treating right now but will  see what Dr. Jeanne Ivan says". 05/2012 "flex sig" ulcer in pouch. Path: ulcerated ileal pouchitis.   08/2008 "Flex Sig".  1) Mild inflammation in anal pouch suspected, await biopsies.  2) Otherwise normal exam, s/p ileoanal pull-through and colectomy. Path:minimally active ileocolitis.  2006 Flex sig/colonoscopy: moderately severe pouchitis, inflammatory polyp, mild stricture at anastomosis.      Past Medical History  Diagnosis Date  . Incisional hernia   . Osteopenia   . Arthropathy     and scroilitis  . Thyroid disease   . Chronic drug-induced interstitial lung disorders   . IBD (inflammatory bowel disease)   . Herniated disc   .  Vitamin D deficiency   . Dysplastic nevi   . Migraine headache   . Crohn's ileocolitis 08/29/2008  . Pouchitis 2006  . History of proctitis 2006  . Morton's neuroma of right foot     Past Surgical History  Procedure Laterality Date  . Colectomy    . Ileoanal pull-through    . Functional endoscopic sinus surgery    . Colonoscopy w/ biopsies  04/16/2006    crohn's colitis  . Flexible sigmoidoscopy  2006 - 02/2014    ileocolitis, pouchitis.  with puch ulcer.   . Sacroilitis    . Sigmoidoscopy  01/2005  . Ventral hernia repair  06/2014    Prior to Admission medications   Medication Sig Start Date End Date Taking? Authorizing Provider  albuterol (PROVENTIL HFA;VENTOLIN HFA) 108 (90 BASE) MCG/ACT inhaler Inhale 2 puffs into the lungs every 6 (six) hours as needed. Patient taking differently: Inhale 1-2 puffs into the lungs every 6 (six) hours as needed for wheezing or shortness of breath.  01/21/13  Yes Dorena Cookey, MD  ANUSOL-HC 25 MG suppository Place 1 suppository (25 mg total) rectally at bedtime as needed for hemorrhoids or itching. 01/04/15  Yes Gatha Mayer, MD  cephALEXin (KEFLEX) 500 MG capsule Take 1,000 mg by mouth 2 (two) times daily. He is to take for 10 days. He started on 02/02/15 and has not completed.   Yes Historical Provider, MD    dexlansoprazole (DEXILANT) 60 MG capsule Take 60 mg by mouth daily.  06/09/13  Yes Historical Provider, MD  diphenoxylate-atropine (LOMOTIL) 2.5-0.025 MG per tablet Take 1 tablet by mouth 4 (four) times daily as needed for diarrhea or loose stools (before meals and at bedtime if needed). 08/31/14  Yes Gatha Mayer, MD  HUMIRA PEN 40 MG/0.8ML PNKT Inject 40 mg into the skin every 14 (fourteen) days. 12/20/14  Yes Historical Provider, MD  ibuprofen (ADVIL,MOTRIN) 200 MG tablet Take 400 mg by mouth every 6 (six) hours as needed (For pain.).  Pt not using this in last 10 days. Generally only uses it a few times per week at most.   Yes Historical Provider, MD  LORazepam (ATIVAN) 1 MG tablet Take 1 mg by mouth at bedtime as needed for anxiety or sleep.   Yes Historical Provider, MD  mometasone-formoterol (DULERA) 100-5 MCG/ACT AERO Inhale 2 puffs into the lungs daily. Inhale 2 puffs into the lungs 2 times daily Patient taking differently: Inhale 2 puffs into the lungs 2 (two) times daily.  09/20/14  Yes Dorena Cookey, MD  AMBULATORY NON FORMULARY MEDICATION Medication Name: generic mesalamine enema 4 gram Insert one rectally nightly Patient not taking: Reported on 02/09/2015 08/02/14   Laban Emperor Zehr, PA-C  Probiotic Product (VSL#3 DS) PACK Take 1 each by mouth daily. Patient not taking: Reported on 02/09/2015 12/22/14   Gatha Mayer, MD  Prednisone  20 mg                  Once daily                   02/03/2015  Scheduled Meds:  Infusions:  PRN Meds:    Allergies as of 02/09/2015 - Review Complete 02/09/2015  Allergen Reaction Noted  . Mercaptopurine Nausea And Vomiting 02/17/2014    Family History  Problem Relation Age of Onset  . Colon cancer      grandmother  . Ulcerative colitis Mother     History   Social History  .  Marital Status: Married    Spouse Name: N/A  . Number of Children: 2  . 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 Smoking status: Never Smoker   . Smokeless tobacco: Never Used  . Alcohol Use: 0.6 oz/week    1 Glasses of wine per week     Comment: once daily  . Drug Use: No  . Sexual Activity: Not on file   Other Topics Concern  . Not on file   Social History Narrative   Father a pulmonologist in [place].   Married to Dr. Honor Junes daughter.   Wife is CRNA    REVIEW OF SYSTEMS: Constitutional:  Stable weight.  Regular aggressive excerciser.  ENT:  No nose bleeds, + nasal congestion/pressure and discharge Pulm:  No COB, some cough with this sinus drainage CV:  No palpitations, no LE edema.  GU:  No hematuria, no frequency GI:  Per HPI Heme:  No unusual bleeding or bruising.    Transfusions:  none Neuro:  No headaches, no peripheral tingling or numbness Derm:  No itching, no rash or sores.  Endocrine:  No sweats or chills.  No polyuria or dysuria Immunization:  Reviewed.  Multiple shots up to date including Hep A and B, flu, pneumovax, MMR, tdap.    PHYSICAL EXAM: Vital signs in last 24 hours: Filed Vitals:   02/09/15 1129  BP: 147/87  Pulse: 84  Temp:   Resp: 18   Wt Readings from Last 3 Encounters:  01/02/15 156 lb (70.761 kg)  12/22/14 155 lb 9.6 oz (70.58 kg)  12/06/14 159 lb (72.122 kg)    General: pleasant, well appearing, lean WM.  comfortable Head:  No swelling or asymmetry.  Eyes:  No icterus or pallor Ears:  Not HOH  Nose:  No discharge, deviated  Mouth:  Clear , moist, good teeth Neck:  No mass, no JVD Lungs:  Clear bil. Slight cough.  No labored breathing Heart: RRR Abdomen:  Thin, tender on right without mass or guarding.  No rebound.  No tinkling or tympanitic BS.  No mass or HSM.  No hernias.   Rectal: deferred   Musc/Skeltl: no joint erythema, swelling or deformity Extremities:  No CCE  Neurologic:  Oriented x 3.  No limb weakness.   Skin:  No rash, sores or telangectasia.  Tattoos:  none Nodes:  No cervical adenopathy.    Psych:  Slightly  anxious.   Intake/Output from previous day:   Intake/Output this shift:    LAB RESULTS:  Recent Labs  02/09/15 1053  WBC 11.0*  HGB 12.2*  HCT 39.4  PLT 362   BMET Lab Results  Component Value Date   NA 138 02/09/2015   NA 136 08/31/2014   NA 135 08/02/2014   K 3.5 02/09/2015   K 4.1 08/31/2014   K 3.9 08/02/2014   CL 102 02/09/2015   CL 99 08/31/2014   CL 98 08/02/2014   CO2 25 02/09/2015   CO2 30 08/31/2014   CO2 30 08/02/2014   GLUCOSE 98 02/09/2015   GLUCOSE 95 08/31/2014   GLUCOSE 109* 08/02/2014   BUN 11 02/09/2015   BUN 16 08/31/2014   BUN 14 08/02/2014   CREATININE 0.90 02/09/2015   CREATININE 0.99 08/31/2014   CREATININE 1.0 08/02/2014   CALCIUM 9.7 02/09/2015   CALCIUM 9.5 08/31/2014   CALCIUM 9.4 08/02/2014   LFT  Recent Labs  02/09/15 1053  PROT 8.2*  ALBUMIN 4.2  AST 20  ALT 16*  ALKPHOS 71  BILITOT 1.7*       Component Value Date/Time   LIPASE 24 02/09/2015 1053     RADIOLOGY STUDIES: Ct Abdomen Pelvis W Contrast  02/09/2015   CLINICAL DATA:  History of Crohn's disease status post total colectomy. Abdominal pain and vomiting since 4 a.m.  EXAM: CT ABDOMEN AND PELVIS WITH CONTRAST  TECHNIQUE: Multidetector CT imaging of the abdomen and pelvis was performed using the standard protocol following bolus administration of intravenous contrast.  CONTRAST:  34m OMNIPAQUE IOHEXOL 300 MG/ML SOLN, 1021mOMNIPAQUE IOHEXOL 300 MG/ML SOLN  COMPARISON:  None.  FINDINGS: Lower chest: The lung bases are clear except for dependent subpleural atelectasis. A heart is normal in size. No pericardial effusion.  Hepatobiliary: No focal hepatic lesions or intrahepatic biliary dilatation. The gallbladder is normal. No common bowel duct dilatation.  Pancreas: No mass, inflammation or ductal dilatation.  Spleen: Normal size.  No focal lesions.  Adrenals/Urinary Tract: The adrenal glands and kidneys are normal. The bladder is normal. The prostate gland and seminal  vesicles are unremarkable.  Stomach/Bowel: Dilated mid distal small bowel loops with air-fluid levels consistent with obstruction. The patient has had a total colectomy. There is moderate wall thickening and mucosal enhancement involving the neo rectum. This could reflect recurrent Crohn's disease. Slightly angulated decompressed small bowel loops are noted in the mid pelvis near surgical clips. This is likely an area of adhesions likely causing the small bowel obstruction.  Vascular/Lymphatic: No mesenteric or retroperitoneal mass or adenopathy. The aorta and branch vessels are patent. The major venous structures are patent.  Other: Knee bladder, prostate gland and seminal vesicles are unremarkable. No pelvic mass or adenopathy. No free pelvic fluid collections. There is small amount of fluid around the liver.  Musculoskeletal: No significant bony findings.  IMPRESSION: Small bowel obstruction likely due to adhesions in the mid central pelvis with caliber change and small bowel loops.  History of Crohn's disease status post total colectomy. There is moderate wall thickening of the neorectum.   Electronically Signed   By: P.Marijo Sanes.D.   On: 02/09/2015 13:31     IMPRESSION:   *  Abd pain with n/v in pt with hx crohn's ileocolitis, s/p total colectomy with ileoanal pull through/j pouch.   Ct c/w SBO.   *  Recurrent sinusitis, MDs treating with prednisone and Keflex.  Had course of same meds 1 month ago for sinusitis.    PLAN:     *  Needs admission. Orders written.  *  spoke with hospitalist re treating sinusitis:  Will give Rocephin.  Continue oral prednisone and clamp tube x 1 hour after taking.  Did not ask hospitalist to follow along.  *  Consider ENT consult tomorrow.    *  Surgery to see pt in consult.    SaAzucena Freed6/23/2016, 1:45 PM Pager: [phone]   [facility] Attending  I have also seen and assessed the patient and agree with the advanced practitioner's assessment and  plan.  Admitted with SBO due to adhesions. Tx sinusitis w/ ceftriaxone and solumedrol for now. GSU to follow.  CaGatha MayerMD, FAAlexand[facility] Lodge[facility] [phone]) 02/09/2015 6:01 PM

## 2015-02-10 ENCOUNTER — Encounter (HOSPITAL_COMMUNITY): Payer: Self-pay | Admitting: Physician Assistant

## 2015-02-10 ENCOUNTER — Inpatient Hospital Stay (HOSPITAL_COMMUNITY): Payer: 59

## 2015-02-10 DIAGNOSIS — J322 Chronic ethmoidal sinusitis: Secondary | ICD-10-CM | POA: Diagnosis present

## 2015-02-10 DIAGNOSIS — K5090 Crohn's disease, unspecified, without complications: Secondary | ICD-10-CM

## 2015-02-10 LAB — BASIC METABOLIC PANEL
Anion gap: 8 (ref 5–15)
BUN: 8 mg/dL (ref 6–20)
CHLORIDE: 102 mmol/L (ref 101–111)
CO2: 29 mmol/L (ref 22–32)
Calcium: 8.8 mg/dL — ABNORMAL LOW (ref 8.9–10.3)
Creatinine, Ser: 0.88 mg/dL (ref 0.61–1.24)
GFR calc Af Amer: >60 mL/min (ref >60)
GFR calc non Af Amer: >60 mL/min (ref >60)
Glucose, Bld: 109 mg/dL — ABNORMAL HIGH (ref 65–99)
POTASSIUM: 3.7 mmol/L (ref 3.5–5.1)
Sodium: 139 mmol/L (ref 135–145)

## 2015-02-10 MED ORDER — CEPHALEXIN 500 MG PO CAPS
1000.0000 mg | ORAL_CAPSULE | Freq: Two times a day (BID) | ORAL | Status: DC
  Administered 2015-02-10: 1000 mg via ORAL
  Filled 2015-02-10 (×2): qty 2

## 2015-02-10 MED ORDER — PREDNISONE 20 MG PO TABS: 20.0000 mg | ORAL_TABLET | Freq: Every day | ORAL | Status: DC

## 2015-02-10 MED ORDER — HYDROCODONE-ACETAMINOPHEN 5-325 MG PO TABS: 1.0000 | ORAL_TABLET | Freq: Four times a day (QID) | ORAL | Status: DC | PRN

## 2015-02-10 MED ORDER — PANTOPRAZOLE SODIUM 40 MG PO TBEC: 40.0000 mg | DELAYED_RELEASE_TABLET | Freq: Every day | ORAL | Status: DC

## 2015-02-10 MED ORDER — PREDNISONE 20 MG PO TABS: 20.0000 mg | ORAL_TABLET | Freq: Every day | ORAL | Status: AC

## 2015-02-10 NOTE — Discharge Instructions (Signed)
° °  Gradually resume solid foods - soft today and stay away from raw vegetables and fruit for 1 week and then can introduice into your diet. Low fiber diet in general is best for you.  Hope you do well but call me if not.  May resume normal activities on Monday 6/27 if feeling ok.  Gatha Mayer, MD, [facility]

## 2015-02-10 NOTE — Progress Notes (Signed)
Patient ID: Charles [name], male   DOB: 1971-01-22, 44 y.o.   MRN: [id number]     [address] [place], [place] [address]    Phone: [phone] FAX: [phone]     Subjective: 1858m output.  Having BMS.  AXR shows resolved.  SBO.   Objective:  Vital signs:  Filed Vitals:   02/09/15 1541 02/09/15 1726 02/09/15 2129 02/10/15 0434  BP: 139/84 140/88 138/82 132/80  Pulse: 66 90 72 68  Temp: 98.5 F (36.9 C) 97 F (36.1 C) 98.6 F (37 C) 97.8 F (36.6 C)  TempSrc: Oral Oral Oral Oral  Resp: _0 Height:  5' 10" (1.778 m)    Weight:  70.4 kg (155 lb 3.3 oz)    SpO2: 97% 98% 97% 100%    Last BM Date: 02/10/15  Intake/Output   Yesterday:  06/23 0701 - 06/24 0700 In: 2023.3 [I.V.:1983.3; NG/GT:40] Out: 1800 [Emesis/NG output:1800] This shift:    I/O last 3 completed shifts: In: 2023.3 [I.V.:1983.3; NG/GT:40] Out: 1800 [Emesis/NG output:1800]    Physical Exam: General: Pt awake/alert/oriented x4 in no acute distress  Abdomen: Soft.  Nondistended. Minimal tenderness to RLQ.   No evidence of peritonitis.  No incarcerated hernias.    Problem List:   Active Problems:   Small bowel obstruction due to adhesions   Small bowel obstruction    Results:   Labs: Results for orders placed or performed during the hospital encounter of 02/09/15 (from the past 48 hour(s))  CBC with Differential     Status: Abnormal   Collection Time: 02/09/15 10:53 AM  Result Value Ref Range   WBC 11.0 (H) 4.0 - 10.5 K/uL   RBC 5.04 4.22 - 5.81 MIL/uL   Hemoglobin 12.2 (L) 13.0 - 17.0 g/dL   HCT 39.4 39.0 - 52.0 %   MCV 78.2 78.0 - 100.0 fL   MCH 24.2 (L) 26.0 - 34.0 pg   MCHC 31.0 30.0 - 36.0 g/dL   RDW 15.7 (H) 11.5 - 15.5 %   Platelets 362 150 - 400 K/uL   Neutrophils Relative % 72 43 - 77 %   Neutro Abs 8.0 (H) 1.7 - 7.7 K/uL   Lymphocytes Relative 18 12 - 46 %   Lymphs Abs 1.9 0.7 - 4.0 K/uL   Monocytes  Relative 9 3 - 12 %   Monocytes Absolute 0.9 0.1 - 1.0 K/uL   Eosinophils Relative 1 0 - 5 %   Eosinophils Absolute 0.1 0.0 - 0.7 K/uL   Basophils Relative 0 0 - 1 %   Basophils Absolute 0.0 0.0 - 0.1 K/uL  Comprehensive metabolic panel     Status: Abnormal   Collection Time: 02/09/15 10:53 AM  Result Value Ref Range   Sodium 138 135 - 145 mmol/L   Potassium 3.5 3.5 - 5.1 mmol/L   Chloride 102 101 - 111 mmol/L   CO2 25 22 - 32 mmol/L   Glucose, Bld 98 65 - 99 mg/dL   BUN 11 6 - 20 mg/dL   Creatinine, Ser 0.90 0.61 - 1.24 mg/dL   Calcium 9.7 8.9 - 10.3 mg/dL   Total Protein 8.2 (H) 6.5 - 8.1 g/dL   Albumin 4.2 3.5 - 5.0 g/dL   AST 20 15 - 41 U/L   ALT 16 (L) 17 - 63 U/L   Alkaline Phosphatase 71 38 - 126 U/L   Total Bilirubin 1.7 (H)  0.3 - 1.2 mg/dL   GFR calc non Af Amer >60 >60 mL/min   GFR calc Af Amer >60 >60 mL/min    Comment: (NOTE) The eGFR has been calculated using the CKD EPI equation. This calculation has not been validated in all clinical situations. eGFR's persistently <60 mL/min signify possible Chronic Kidney Disease.    Anion gap 11 5 - 15  Lipase, blood     Status: None   Collection Time: 02/09/15 10:53 AM  Result Value Ref Range   Lipase 24 22 - 51 U/L  Lactic acid, plasma     Status: None   Collection Time: 02/09/15 10:53 AM  Result Value Ref Range   Lactic Acid, Venous 1.0 0.5 - 2.0 mmol/L  Urinalysis, Routine w reflex microscopic (not at [facility])     Status: None   Collection Time: 02/09/15  1:21 PM  Result Value Ref Range   Color, Urine YELLOW YELLOW   APPearance CLEAR CLEAR   Specific Gravity, Urine 1.027 1.005 - 1.030   pH 6.0 5.0 - 8.0   Glucose, UA NEGATIVE NEGATIVE mg/dL   Hgb urine dipstick NEGATIVE NEGATIVE   Bilirubin Urine NEGATIVE NEGATIVE   Ketones, ur NEGATIVE NEGATIVE mg/dL   Protein, ur NEGATIVE NEGATIVE mg/dL   Urobilinogen, UA 0.2 0.0 - 1.0 mg/dL   Nitrite NEGATIVE NEGATIVE   Leukocytes, UA NEGATIVE NEGATIVE    Comment:  MICROSCOPIC NOT DONE ON URINES WITH NEGATIVE PROTEIN, BLOOD, LEUKOCYTES, NITRITE, OR GLUCOSE <1000 mg/dL.  Lactic acid, plasma     Status: None   Collection Time: 02/09/15  1:51 PM  Result Value Ref Range   Lactic Acid, Venous 0.9 0.5 - 2.0 mmol/L  Basic metabolic panel     Status: Abnormal   Collection Time: 02/10/15  3:53 AM  Result Value Ref Range   Sodium 139 135 - 145 mmol/L   Potassium 3.7 3.5 - 5.1 mmol/L   Chloride 102 101 - 111 mmol/L   CO2 29 22 - 32 mmol/L   Glucose, Bld 109 (H) 65 - 99 mg/dL   BUN 8 6 - 20 mg/dL   Creatinine, Ser 0.88 0.61 - 1.24 mg/dL   Calcium 8.8 (L) 8.9 - 10.3 mg/dL   GFR calc non Af Amer >60 >60 mL/min   GFR calc Af Amer >60 >60 mL/min    Comment: (NOTE) The eGFR has been calculated using the CKD EPI equation. This calculation has not been validated in all clinical situations. eGFR's persistently <60 mL/min signify possible Chronic Kidney Disease.    Anion gap 8 5 - 15    Imaging / Studies: Ct Abdomen Pelvis W Contrast  02/09/2015   CLINICAL DATA:  History of Crohn's disease status post total colectomy. Abdominal pain and vomiting since 4 a.m.  EXAM: CT ABDOMEN AND PELVIS WITH CONTRAST  TECHNIQUE: Multidetector CT imaging of the abdomen and pelvis was performed using the standard protocol following bolus administration of intravenous contrast.  CONTRAST:  54m OMNIPAQUE IOHEXOL 300 MG/ML SOLN, 1075mOMNIPAQUE IOHEXOL 300 MG/ML SOLN  COMPARISON:  None.  FINDINGS: Lower chest: The lung bases are clear except for dependent subpleural atelectasis. A heart is normal in size. No pericardial effusion.  Hepatobiliary: No focal hepatic lesions or intrahepatic biliary dilatation. The gallbladder is normal. No common bowel duct dilatation.  Pancreas: No mass, inflammation or ductal dilatation.  Spleen: Normal size.  No focal lesions.  Adrenals/Urinary Tract: The adrenal glands and kidneys are normal. The bladder is normal. The prostate gland and  seminal vesicles  are unremarkable.  Stomach/Bowel: Dilated mid distal small bowel loops with air-fluid levels consistent with obstruction. The patient has had a total colectomy. There is moderate wall thickening and mucosal enhancement involving the neo rectum. This could reflect recurrent Crohn's disease. Slightly angulated decompressed small bowel loops are noted in the mid pelvis near surgical clips. This is likely an area of adhesions likely causing the small bowel obstruction.  Vascular/Lymphatic: No mesenteric or retroperitoneal mass or adenopathy. The aorta and branch vessels are patent. The major venous structures are patent.  Other: Knee bladder, prostate gland and seminal vesicles are unremarkable. No pelvic mass or adenopathy. No free pelvic fluid collections. There is small amount of fluid around the liver.  Musculoskeletal: No significant bony findings.  IMPRESSION: Small bowel obstruction likely due to adhesions in the mid central pelvis with caliber change and small bowel loops.  History of Crohn's disease status post total colectomy. There is moderate wall thickening of the neorectum.   Electronically Signed   By: Marijo Sanes M.D.   On: 02/09/2015 13:31   Dg Abd 2 Views  02/10/2015   CLINICAL DATA:  Follow-up small bowel obstruction. Subsequent encounter.  EXAM: ABDOMEN - 2 VIEW  COMPARISON:  CT of the abdomen and pelvis from 02/09/2015  FINDINGS: The patient's enteric tube is noted ending overlying the body of the stomach.  Previously noted small bowel dilatation has apparently resolved, with air seen at the residual colon and distal ileum within the lower abdomen and pelvis. The bowel gas pattern is within normal limits. The stomach is relatively decompressed, containing a small amount of air and solid material. Clips are seen overlying the left lower quadrant. No free intra-abdominal air is seen on the provided decubitus view.  The visualized lung bases are clear. No acute osseous abnormalities are  identified. Minimal degenerative change is noted at the lower lumbar spine.  IMPRESSION: 1. Small bowel dilatation has apparently resolved, with air seen at the residual colon and distal ileum within the lower abdomen and pelvis. Visualized bowel gas pattern is within normal limits. No free intra-abdominal air seen. 2. Enteric tube noted ending overlying the body of the stomach.   Electronically Signed   By: Garald Balding M.D.   On: 02/10/2015 06:49    Medications / Allergies:  Scheduled Meds: . antiseptic oral rinse  7 mL Mouth Rinse q12n4p  . cefTRIAXone (ROCEPHIN)  IV  1 g Intravenous Q24H  . chlorhexidine  15 mL Mouth Rinse BID  . heparin  5,000 Units Subcutaneous 3 times per day  . methylPREDNISolone (SOLU-MEDROL) injection  20 mg Intravenous Daily  . mometasone-formoterol  2 puff Inhalation BID  . pantoprazole (PROTONIX) IV  40 mg Intravenous Q24H   Continuous Infusions: . dextrose 5 % and 0.9% NaCl 1,000 mL (02/10/15 0639)   PRN Meds:.HYDROmorphone (DILAUDID) injection, LORazepam, menthol-cetylpyridinium, ondansetron **OR** ondansetron (ZOFRAN) IV, phenol  Antibiotics: Anti-infectives    Start     Dose/Rate Route Frequency Ordered Stop   02/09/15 1500  cefTRIAXone (ROCEPHIN) 1 g in dextrose 5 % 50 mL IVPB    Comments:  For sinusitis, to replace Keflex which started on 6/20   1 g 100 mL/hr over 30 Minutes Intravenous Every 24 hours 02/09/15 1450          Assessment/Plan SBO, likely secondary to adhesive disease from extensive abdominal surgeries -resolving.  Agree with clamping trial and allowing for clears today.  Will continue to follow along.   Olden Klauer, ANP-BC  [facility] Pager [phone])   02/10/2015 9:40 AM

## 2015-02-10 NOTE — Discharge Summary (Signed)
Discharge Summary:  Name: Charles [name] MRN: [id number] DOB: [birth year] 44 y.o. PCP:  Joycelyn Man, MD  Date of Admission: 02/09/2015 10:19 AM Date of Discharge: 02/10/2015 Attending Physician: Gatha Mayer, MD  Admitting Dignosis: Active Problems:   CROHN'S DISEASE, LARGE AND SMALL INTESTINES   Small bowel obstruction due to adhesions   Ethmoid sinusitis   * Crohn's ileocolitis, proctitis. S/p total colectomy/ileostomy/j pouch.  Chronic Humira.    *  Recurrent, acute sinusitis   Discharge Diagnosis: Active Problems:   CROHN'S DISEASE, LARGE AND SMALL INTESTINES   Small bowel obstruction due to adhesions   Ethmoid sinusitis  * Crohn's ileocolitis, proctitis. S/p total colectomy/ileostomy/j pouch.  Chronic Humira.   *  Sinusitis, acute recurrent   Previous Medical/Surgical history Past Medical History  Diagnosis Date  . Incisional hernia   . Osteopenia   . Arthropathy     and scroilitis  . Thyroid disease   . Chronic drug-induced interstitial lung disorders   . IBD (inflammatory bowel disease)   . Herniated disc   . Vitamin D deficiency   . Dysplastic nevi   . Migraine headache   . Crohn's ileocolitis 08/29/2008  . Pouchitis 2006  . History of proctitis 2006  . Morton's neuroma of right foot   . Small bowel obstruction due to adhesions 02/09/2015   Past Surgical History  Procedure Laterality Date  . Colectomy    . Ileoanal pull-through    . Functional endoscopic sinus surgery  12/2003    partial ethmoidectomy.  Dr Wilburn Cornelia  . Colonoscopy w/ biopsies  04/16/2006    crohn's colitis  . Flexible sigmoidoscopy  2006 - 02/2014    ileocolitis, pouchitis.  with pouch ulcer.   . Sacroilitis    . Sigmoidoscopy  01/2005  . Ventral hernia repair  06/2014    Brief History: Pt  has hx Crohn's disease.  S/p total colectomy/ileostomy/j pouch in 2004.  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  Followed by Dr Carlean Purl in [place] and Dr Jeanne Ivan at [facility].  Pt developed abdominal pain on evening PTA, the next AM he was vomiting.  Stool output had diminished the day PTA.  In ED a CT showed SBO due to adhesions.   Hospital Course by problem list:  *  SBO NGT was placed.  It drained a total of 1.8 liters.  Abdominal film on day 2 showed resolution of obstruction.  The NGT was clamped and clear diet initiated.   *  Sinusitis. Pt had been treated for sinusitis with coarse of prednisone and Keflex about one month ago.  Last week pt developed recurrent sinus pressure/drainage.  Was on  Prednisone 20 mg daily.  4 days PTA his medical MD prescribed Keflex.    Pt has hx of sinus surgery (partial ethmoidectomy) 2005.  Since pt npo, Solumedrol and Rocephin were substituted for prednisone/keflex.    *  Crohn's disease. Pt will continue to treat this with bimonthly Remicade. Next dose due 02/12/15.       Wt Readings from Last 1 Encounters:  02/09/15 155 lb 3.3 oz (70.4 kg)  Discharge Medications:   Medication List    STOP taking these medications        AMBULATORY NON FORMULARY MEDICATION      TAKE these medications        albuterol 108 (90 BASE) MCG/ACT inhaler  Commonly known as:  PROVENTIL HFA;VENTOLIN HFA  Inhale 2 puffs into the lungs every 6 (six) hours as needed.     ANUSOL-HC 25 MG suppository  Generic drug:  hydrocortisone  Place 1 suppository (25 mg total) rectally at bedtime as needed for hemorrhoids or itching.     cephALEXin 500 MG capsule  Commonly known as:  KEFLEX  Take 1,000 mg by mouth 2 (two) times daily. He is to take for 10 days. He started on 02/02/15 and has not completed.     DEXILANT 60 MG capsule  Generic drug:  dexlansoprazole  Take 60 mg by mouth daily.     diphenoxylate-atropine 2.5-0.025 MG per tablet  Commonly known  as:  LOMOTIL  Take 1 tablet by mouth 4 (four) times daily as needed for diarrhea or loose stools (before meals and at bedtime if needed).     HUMIRA PEN 40 MG/0.8ML Pnkt  Generic drug:  Adalimumab  Inject 40 mg into the skin every 14 (fourteen) days.     HYDROcodone-acetaminophen 5-325 MG per tablet  Commonly known as:  NORCO/VICODIN  Take 1 tablet by mouth every 6 (six) hours as needed for moderate pain or severe pain.     ibuprofen 200 MG tablet  Commonly known as:  ADVIL,MOTRIN  Take 400 mg by mouth every 6 (six) hours as needed (For pain.).     LORazepam 1 MG tablet  Commonly known as:  ATIVAN  Take 1 mg by mouth at bedtime as needed for anxiety or sleep.     mometasone-formoterol 100-5 MCG/ACT Aero  Commonly known as:  DULERA  Inhale 2 puffs into the lungs daily. Inhale 2 puffs into the lungs 2 times daily     predniSONE 20 MG tablet  Commonly known as:  DELTASONE  Take 1 tablet (20 mg total) by mouth daily before breakfast. While on cephalexin     VSL#3 DS Pack  Take 1 each by mouth daily.        Consultations: None   Procedures Performed:  Ct Abdomen Pelvis W Contrast  02/09/2015   CLINICAL DATA:  History of Crohn's disease status post total colectomy. Abdominal pain and vomiting since 4 a.m.  EXAM: CT ABDOMEN AND PELVIS WITH CONTRAST  TECHNIQUE: Multidetector CT imaging of the abdomen and pelvis was performed using the standard protocol following bolus administration of intravenous contrast.  CONTRAST:  37m OMNIPAQUE IOHEXOL 300 MG/ML SOLN, 1058mOMNIPAQUE IOHEXOL 300 MG/ML SOLN  COMPARISON:  None.  FINDINGS: Lower chest: The lung bases are clear except for dependent subpleural atelectasis. A heart is normal in size. No pericardial effusion.  Hepatobiliary: No focal hepatic lesions or intrahepatic biliary dilatation. The gallbladder is normal. No common bowel duct dilatation.  Pancreas: No mass, inflammation or ductal dilatation.  Spleen: Normal size.  No focal lesions.   Adrenals/Urinary Tract: The adrenal glands and kidneys are normal. The bladder is normal. The prostate gland and seminal vesicles are unremarkable.  Stomach/Bowel: Dilated mid distal small bowel loops with air-fluid levels consistent with obstruction. The patient has had a total colectomy. There is moderate wall thickening and mucosal enhancement involving the neo rectum. This could reflect recurrent Crohn's disease. Slightly angulated decompressed small bowel loops are noted  in the mid pelvis near surgical clips. This is likely an area of adhesions likely causing the small bowel obstruction.  Vascular/Lymphatic: No mesenteric or retroperitoneal mass or adenopathy. The aorta and branch vessels are patent. The major venous structures are patent.  Other: Knee bladder, prostate gland and seminal vesicles are unremarkable. No pelvic mass or adenopathy. No free pelvic fluid collections. There is small amount of fluid around the liver.  Musculoskeletal: No significant bony findings.  IMPRESSION: Small bowel obstruction likely due to adhesions in the mid central pelvis with caliber change and small bowel loops.  History of Crohn's disease status post total colectomy. There is moderate wall thickening of the neorectum.   Electronically Signed   By: Marijo Sanes M.D.   On: 02/09/2015 13:31   Dg Abd 2 Views  02/10/2015   CLINICAL DATA:  Follow-up small bowel obstruction. Subsequent encounter.  EXAM: ABDOMEN - 2 VIEW  COMPARISON:  CT of the abdomen and pelvis from 02/09/2015  FINDINGS: The patient's enteric tube is noted ending overlying the body of the stomach.  Previously noted small bowel dilatation has apparently resolved, with air seen at the residual colon and distal ileum within the lower abdomen and pelvis. The bowel gas pattern is within normal limits. The stomach is relatively decompressed, containing a small amount of air and solid material. Clips are seen overlying the left lower quadrant. No free  intra-abdominal air is seen on the provided decubitus view.  The visualized lung bases are clear. No acute osseous abnormalities are identified. Minimal degenerative change is noted at the lower lumbar spine.  IMPRESSION: 1. Small bowel dilatation has apparently resolved, with air seen at the residual colon and distal ileum within the lower abdomen and pelvis. Visualized bowel gas pattern is within normal limits. No free intra-abdominal air seen. 2. Enteric tube noted ending overlying the body of the stomach.   Electronically Signed   By: Garald Balding M.D.   On: 02/10/2015 06:49    Discharge Labs:  Results for orders placed or performed during the hospital encounter of 02/09/15 (from the past 24 hour(s))  Basic metabolic panel     Status: Abnormal   Collection Time: 02/10/15  3:53 AM  Result Value Ref Range   Sodium 139 135 - 145 mmol/L   Potassium 3.7 3.5 - 5.1 mmol/L   Chloride 102 101 - 111 mmol/L   CO2 29 22 - 32 mmol/L   Glucose, Bld 109 (H) 65 - 99 mg/dL   BUN 8 6 - 20 mg/dL   Creatinine, Ser 0.88 0.61 - 1.24 mg/dL   Calcium 8.8 (L) 8.9 - 10.3 mg/dL   GFR calc non Af Amer >60 >60 mL/min   GFR calc Af Amer >60 >60 mL/min   Anion gap 8 5 - 15    Disposition and follow-up:   Mr.Charles [name] was discharged from  in stable, improved condition.  Follow up Appointments: call/notify Dr. Carlean Purl with symptom update next week and further follow-up will be arranfged Pt aware he should make appt with ENT if sinusitis does not resolve    Diet at Discharge: Soft and advance to low fiber  Time Spent on discharge: 25 minutes   Signed: Otho Bellows Santina Evans  [phone] 02/10/2015, 2:19 PM

## 2015-02-10 NOTE — Progress Notes (Signed)
Pt refused medication this am. Pt stated his wife would bring his from home.

## 2015-02-10 NOTE — Progress Notes (Signed)
Daily Rounding Note  02/10/2015, 9:05 AM  LOS: 1 day   SUBJECTIVE:       4 stools starting last night, most of them large volume. NGT output 1.8 liters.    Still using the Dilaudid   OBJECTIVE:         Vital signs in last 24 hours:    Temp:  [97 F (36.1 C)-98.6 F (37 C)] 97.8 F (36.6 C) (06/24 0434) Pulse Rate:  [60-90] 68 (06/24 0434) Resp:  [16-18] 18 (06/24 0434) BP: (132-147)/(76-97) 132/80 mmHg (06/24 0434) SpO2:  [97 %-100 %] 100 % (06/24 0434) Weight:  [155 lb 3.3 oz (70.4 kg)] 155 lb 3.3 oz (70.4 kg) (06/23 1726) Last BM Date: 02/10/15 Filed Weights   02/09/15 1726  Weight: 155 lb 3.3 oz (70.4 kg)   General: pleasant, looks well.  NGT in place   Heart: RRR Chest: clear bil.   Abdomen: soft,  NT, ND.  Active BS  Extremities: no CCE Neuro/Psych:  Pleasant, alert.  Oriented x 3.  No deficits.  Relaxed.   Intake/Output from previous day: 06/23 0701 - 06/24 0700 In: 2023.3 [I.V.:1983.3; NG/GT:40] Out: 1800 [Emesis/NG output:1800]  Intake/Output this shift:    Lab Results:  Recent Labs  02/09/15 1053  WBC 11.0*  HGB 12.2*  HCT 39.4  PLT 362   BMET  Recent Labs  02/09/15 1053 02/10/15 0353  NA 138 139  K 3.5 3.7  CL 102 102  CO2 25 29  GLUCOSE 98 109*  BUN 11 8  CREATININE 0.90 0.88  CALCIUM 9.7 8.8*   LFT  Recent Labs  02/09/15 1053  PROT 8.2*  ALBUMIN 4.2  AST 20  ALT 16*  ALKPHOS 21  BILITOT 1.7*    Studies/Results: Ct Abdomen Pelvis W Contrast  02/09/2015   CLINICAL DATA:  History of Crohn's disease status post total colectomy. Abdominal pain and vomiting since 4 a.m.  EXAM: CT ABDOMEN AND PELVIS WITH CONTRAST  TECHNIQUE: Multidetector CT imaging of the abdomen and pelvis was performed using the standard protocol following bolus administration of intravenous contrast.  CONTRAST:  13m OMNIPAQUE IOHEXOL 300 MG/ML SOLN, 1063mOMNIPAQUE IOHEXOL 300 MG/ML SOLN  COMPARISON:   None.  FINDINGS: Lower chest: The lung bases are clear except for dependent subpleural atelectasis. A heart is normal in size. No pericardial effusion.  Hepatobiliary: No focal hepatic lesions or intrahepatic biliary dilatation. The gallbladder is normal. No common bowel duct dilatation.  Pancreas: No mass, inflammation or ductal dilatation.  Spleen: Normal size.  No focal lesions.  Adrenals/Urinary Tract: The adrenal glands and kidneys are normal. The bladder is normal. The prostate gland and seminal vesicles are unremarkable.  Stomach/Bowel: Dilated mid distal small bowel loops with air-fluid levels consistent with obstruction. The patient has had a total colectomy. There is moderate wall thickening and mucosal enhancement involving the neo rectum. This could reflect recurrent Crohn's disease. Slightly angulated decompressed small bowel loops are noted in the mid pelvis near surgical clips. This is likely an area of adhesions likely causing the small bowel obstruction.  Vascular/Lymphatic: No mesenteric or retroperitoneal mass or adenopathy. The aorta and branch vessels are patent. The major venous structures are patent.  Other: Knee bladder, prostate gland and seminal vesicles are unremarkable. No pelvic mass or adenopathy. No free pelvic fluid collections. There is small amount of fluid around the liver.  Musculoskeletal: No significant bony findings.  IMPRESSION: Small bowel obstruction likely due to  adhesions in the mid central pelvis with caliber change and small bowel loops.  History of Crohn's disease status post total colectomy. There is moderate wall thickening of the neorectum.   Electronically Signed   By: Marijo Sanes M.D.   On: 02/09/2015 13:31   Dg Abd 2 Views  02/10/2015   CLINICAL DATA:  Follow-up small bowel obstruction. Subsequent encounter.  EXAM: ABDOMEN - 2 VIEW  COMPARISON:  CT of the abdomen and pelvis from 02/09/2015  FINDINGS: The patient's enteric tube is noted ending overlying the  body of the stomach.  Previously noted small bowel dilatation has apparently resolved, with air seen at the residual colon and distal ileum within the lower abdomen and pelvis. The bowel gas pattern is within normal limits. The stomach is relatively decompressed, containing a small amount of air and solid material. Clips are seen overlying the left lower quadrant. No free intra-abdominal air is seen on the provided decubitus view.  The visualized lung bases are clear. No acute osseous abnormalities are identified. Minimal degenerative change is noted at the lower lumbar spine.  IMPRESSION: 1. Small bowel dilatation has apparently resolved, with air seen at the residual colon and distal ileum within the lower abdomen and pelvis. Visualized bowel gas pattern is within normal limits. No free intra-abdominal air seen. 2. Enteric tube noted ending overlying the body of the stomach.   Electronically Signed   By: Garald Balding M.D.   On: 02/10/2015 06:49   Scheduled Meds: . antiseptic oral rinse  7 mL Mouth Rinse q12n4p  . cefTRIAXone (ROCEPHIN)  IV  1 g Intravenous Q24H  . chlorhexidine  15 mL Mouth Rinse BID  . heparin  5,000 Units Subcutaneous 3 times per day  . methylPREDNISolone (SOLU-MEDROL) injection  20 mg Intravenous Daily  . mometasone-formoterol  2 puff Inhalation BID  . pantoprazole (PROTONIX) IV  40 mg Intravenous Q24H   Continuous Infusions: . dextrose 5 % and 0.9% NaCl 1,000 mL (02/10/15 0639)   PRN Meds:.HYDROmorphone (DILAUDID) injection, LORazepam, menthol-cetylpyridinium, ondansetron **OR** ondansetron (ZOFRAN) IV, phenol   ASSESMENT:   *  SBO, due to adhesions.  Resolved per this AMs xray.  Hx Crohn's ileitis.  S/p colectomy/Jpouch.   *  Sinusitis On Rocephin, Solumedrol.    PLAN   *  Clamp NGT.  Add clears.  Remove tube in 3 hours if tolerates.   *  ? Home today if tolerates above? *  Suggested pt follow up with his ENT next week.      Azucena Freed  02/10/2015, 9:05  AM Pager: [phone]  [facility] Attending  I have also seen and assessed the patient and agree with the advanced practitioner's assessment and plan. SBO is resolved  Will dc  Gatha Mayer, MD, [facility] [phone] (pager) 02/10/2015 2:07 PM

## 2015-02-10 NOTE — Progress Notes (Signed)
NG tube is out per MD's orders, patient tolerated it well. Will keep monitoring

## 2015-02-14 ENCOUNTER — Encounter: Payer: Self-pay | Admitting: Internal Medicine

## 2015-02-14 ENCOUNTER — Other Ambulatory Visit: Payer: Self-pay | Admitting: Family Medicine

## 2015-02-14 DIAGNOSIS — Z0000 Encounter for general adult medical examination without abnormal findings: Secondary | ICD-10-CM

## 2015-02-14 DIAGNOSIS — E079 Disorder of thyroid, unspecified: Secondary | ICD-10-CM

## 2015-02-17 ENCOUNTER — Other Ambulatory Visit: Payer: 59

## 2015-02-22 ENCOUNTER — Other Ambulatory Visit: Payer: 59

## 2015-02-23 ENCOUNTER — Other Ambulatory Visit (INDEPENDENT_AMBULATORY_CARE_PROVIDER_SITE_OTHER): Payer: 59

## 2015-02-23 DIAGNOSIS — E079 Disorder of thyroid, unspecified: Secondary | ICD-10-CM | POA: Diagnosis not present

## 2015-02-23 DIAGNOSIS — Z0000 Encounter for general adult medical examination without abnormal findings: Secondary | ICD-10-CM

## 2015-02-23 DIAGNOSIS — E559 Vitamin D deficiency, unspecified: Secondary | ICD-10-CM

## 2015-02-23 LAB — CBC WITH DIFFERENTIAL/PLATELET
Basophils Absolute: 0 10*3/uL (ref 0.0–0.1)
Basophils Relative: 0 % (ref 0.0–3.0)
Eosinophils Absolute: 0.1 10*3/uL (ref 0.0–0.7)
Eosinophils Relative: 0.7 % (ref 0.0–5.0)
HEMATOCRIT: 37.3 % — AB (ref 39.0–52.0)
HEMOGLOBIN: 12.1 g/dL — AB (ref 13.0–17.0)
LYMPHS ABS: 1.4 10*3/uL (ref 0.7–4.0)
Lymphocytes Relative: 14.7 % (ref 12.0–46.0)
MCHC: 32.6 g/dL (ref 30.0–36.0)
MCV: 77.6 fl — AB (ref 78.0–100.0)
MONO ABS: 0.6 10*3/uL (ref 0.1–1.0)
MONOS PCT: 5.6 % (ref 3.0–12.0)
NEUTROS ABS: 7.7 10*3/uL (ref 1.4–7.7)
Neutrophils Relative %: 79 % — ABNORMAL HIGH (ref 43.0–77.0)
PLATELETS: 325 10*3/uL (ref 150.0–400.0)
RBC: 4.8 Mil/uL (ref 4.22–5.81)
RDW: 17.1 % — ABNORMAL HIGH (ref 11.5–15.5)
WBC: 9.8 10*3/uL (ref 4.0–10.5)

## 2015-02-23 LAB — LIPID PANEL
CHOL/HDL RATIO: 3
Cholesterol: 199 mg/dL (ref 0–200)
HDL: 58.8 mg/dL (ref >39.00)
LDL CALC: 120 mg/dL — AB (ref 0–99)
NonHDL: 140.2
Triglycerides: 100 mg/dL (ref 0.0–149.0)
VLDL: 20 mg/dL (ref 0.0–40.0)

## 2015-02-23 LAB — BASIC METABOLIC PANEL
BUN: 11 mg/dL (ref 6–23)
CHLORIDE: 100 meq/L (ref 96–112)
CO2: 30 mEq/L (ref 19–32)
Calcium: 10 mg/dL (ref 8.4–10.5)
Creatinine, Ser: 1.07 mg/dL (ref 0.40–1.50)
GFR: 80.42 mL/min (ref >60.00)
Glucose, Bld: 83 mg/dL (ref 70–99)
POTASSIUM: 4.2 meq/L (ref 3.5–5.1)
Sodium: 138 mEq/L (ref 135–145)

## 2015-02-23 LAB — HEPATIC FUNCTION PANEL
ALBUMIN: 4.2 g/dL (ref 3.5–5.2)
ALT: 13 U/L (ref 0–53)
AST: 20 U/L (ref 0–37)
Alkaline Phosphatase: 61 U/L (ref 39–117)
BILIRUBIN DIRECT: 0.3 mg/dL (ref 0.0–0.3)
Total Bilirubin: 1.7 mg/dL — ABNORMAL HIGH (ref 0.2–1.2)
Total Protein: 7.9 g/dL (ref 6.0–8.3)

## 2015-02-23 LAB — TSH: TSH: 2.42 u[IU]/mL (ref 0.35–4.50)

## 2015-02-23 LAB — VITAMIN D 25 HYDROXY (VIT D DEFICIENCY, FRACTURES): VITD: 28.62 ng/mL — AB (ref 30.00–100.00)

## 2015-02-23 NOTE — Addendum Note (Signed)
Addended by: Townsend Roger D on: 02/23/2015 09:09 AM   Modules accepted: Orders

## 2015-03-17 ENCOUNTER — Other Ambulatory Visit: Payer: Self-pay | Admitting: Family Medicine

## 2015-03-21 ENCOUNTER — Other Ambulatory Visit: Payer: Self-pay | Admitting: *Deleted

## 2015-03-21 MED ORDER — LORAZEPAM 1 MG PO TABS: 1.0000 mg | ORAL_TABLET | Freq: Every evening | ORAL | Status: DC | PRN

## 2015-04-04 ENCOUNTER — Other Ambulatory Visit: Payer: Self-pay | Admitting: Internal Medicine

## 2015-04-06 ENCOUNTER — Telehealth: Payer: Self-pay | Admitting: Family Medicine

## 2015-04-06 MED ORDER — ZOLMITRIPTAN 5 MG PO TABS: ORAL_TABLET | ORAL | Status: DC

## 2015-04-06 MED ORDER — ALBUTEROL SULFATE HFA 108 (90 BASE) MCG/ACT IN AERS: 2.0000 | INHALATION_SPRAY | Freq: Four times a day (QID) | RESPIRATORY_TRACT | Status: DC | PRN

## 2015-04-06 NOTE — Telephone Encounter (Signed)
Dr Sherren Mocha called in (2) rx for pt this morning , but only left instructions "as directed" Pharm states they cannot use these instructions. meds were  Zomig and albuterol (PROVENTIL HFA;VENTOLIN HFA) 108 (90 BASE) MCG/ACT inhaler  They have never filled Zomig for pt. But pharm states usually instructions are:  1 tab, then in 2 hrs may repeat as needed for migraine.  [facility]

## 2015-04-10 ENCOUNTER — Ambulatory Visit (INDEPENDENT_AMBULATORY_CARE_PROVIDER_SITE_OTHER): Payer: 59 | Admitting: Family Medicine

## 2015-04-10 ENCOUNTER — Encounter: Payer: Self-pay | Admitting: Family Medicine

## 2015-04-10 VITALS — BP 118/82 | HR 70 | Ht 69.25 in | Wt 154.0 lb

## 2015-04-10 DIAGNOSIS — M546 Pain in thoracic spine: Secondary | ICD-10-CM

## 2015-04-10 DIAGNOSIS — M9908 Segmental and somatic dysfunction of rib cage: Secondary | ICD-10-CM | POA: Diagnosis not present

## 2015-04-10 DIAGNOSIS — M9903 Segmental and somatic dysfunction of lumbar region: Secondary | ICD-10-CM

## 2015-04-10 DIAGNOSIS — G5762 Lesion of plantar nerve, left lower limb: Secondary | ICD-10-CM | POA: Insufficient documentation

## 2015-04-10 DIAGNOSIS — M999 Biomechanical lesion, unspecified: Secondary | ICD-10-CM

## 2015-04-10 DIAGNOSIS — G5782 Other specified mononeuropathies of left lower limb: Secondary | ICD-10-CM | POA: Insufficient documentation

## 2015-04-10 DIAGNOSIS — M9902 Segmental and somatic dysfunction of thoracic region: Secondary | ICD-10-CM

## 2015-04-10 NOTE — Progress Notes (Signed)
Pre visit review using our clinic review tool, if applicable. No additional management support is needed unless otherwise documented below in the visit note. 

## 2015-04-10 NOTE — Patient Instructions (Signed)
Good to see you! Focus on changing handle bars up 1 inch Lace shoes differently New exercises for the hop flexor after running or long bike rides.  Continue the orthotics in most of your shoes as well.  Consider other ones for your cycling shoes Watch the foot and if ot  A lot better lets do an injection Make an appointment in 3 weeks but if doin well I like the 3 months for the neck and upper back.

## 2015-04-10 NOTE — Assessment & Plan Note (Signed)
Decision today to treat with OMT was based on Physical Exam  After verbal consent patient was treated with HVLA, ME techniques in thoracici, rib and lumbar areas  Patient tolerated the procedure well with improvement in symptoms  Patient given exercises, stretches and lifestyle modifications  See medications in patient instructions if given  Patient will follow up in 4-6 weeks

## 2015-04-10 NOTE — Progress Notes (Signed)
  [name] [address], [place] [address] Phone: [phone] Subjective:    CC:  recurrent foot pain, back pain follow up  MAY:[id number] Charles [name] is a 44 y.o. male coming in recurrent foot pain.     patient states that his upper back pain and seems to be doing relatively well. Patient has noticed some tightness. Has been riding his cycle much more frequent previously. States that when he is in the tucked position and seems to be more discomfort. Patient has been a lot hiking as well and was wearing a backpack. Patient denies any radiation down the arms. States that it's very difficult to get comfortable at night. Responded very well to osteopathic manipulation.  Point makes this difficult this patient's history of Crohn's disease. Patient is on vitamin D supplementation.  Patient does have some back pain. Likely some of it is due to the inflammation from his Crohn's disease as well as muscle imbalances and patient's training schedule. 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 Patient has responded very well to conservative therapy in the past including osteopathic manipulation. Patient states some tightness of the hip flexor. Think since related to him increasing his activity well in the saddle. No radiation down the legs or any weakness.  Patient does have known Morton's neuroma. Having more issues with the left side. Patient states some numbness occurring when he is increasing his activity. Still avoiding significant amount of running.     Past medical history, social, surgical and family history all reviewed in electronic medical record.   Review of Systems: No headache, visual changes, nausea, vomiting, diarrhea, constipation, dizziness, abdominal pain, skin rash, fevers, chills, night sweats, weight loss, swollen lymph nodes, body aches, joint swelling, muscle aches, chest pain, shortness of breath,  mood changes.   Objective Blood pressure 118/82, pulse 70, height 5' 9.25" (1.759 m), weight 154 lb (69.854 kg), SpO2 99 %.  General: No apparent distress alert and oriented x3 mood and affect normal, dressed appropriately.  HEENT: Pupils equal, extraocular movements intact  Respiratory: Patient's speak in full sentences and does not appear short of breath  Cardiovascular: No lower extremity edema, non tender, no erythema  Skin: Warm dry intact with no signs of infection or rash on extremities or on axial skeleton.  Abdomen: Soft nontender  Neuro: Cranial nerves II through XII are intact, neurovascularly intact in all extremities with 2+ DTRs and 2+ pulses.  Lymph: No lymphadenopathy of posterior or anterior cervical chain or axillae bilaterally.  Gait analysis shows the patient does have knees track over the medial line. MSK:  Non tender with full range of motion and good stability and symmetric strength and tone of shoulders, elbows,  hip, knee and ankles bilaterally.   Foot exam shows the patient does have breakdown of transverse arch bilaterally.  Otherwise fairly unremarkable. Patient is minimally tender to palpation between the third and fourth and fourth and fifth metatarsal heads still on the left side    Osteopathic findings  cervical  C3 flexed rotated and side bent right Thoracic T3 extended rotated and side bent right with inhaled Lumbar L2 flexed rotated and side bent right  Impression and Recommendations:

## 2015-04-10 NOTE — Assessment & Plan Note (Signed)
Discussed the patient is doing exercises on areolar basis. Some of this could be inflammatory arthropathy as well need to continue to monitor. We discussed that no imaging likely is necessary at this time. Discussed changing position on his bike. Patient will try to make these changes and come back and see me again in 4-6 weeks.

## 2015-04-12 ENCOUNTER — Other Ambulatory Visit: Payer: Self-pay | Admitting: *Deleted

## 2015-04-12 DIAGNOSIS — I83893 Varicose veins of bilateral lower extremities with other complications: Secondary | ICD-10-CM

## 2015-05-02 ENCOUNTER — Encounter: Payer: Self-pay | Admitting: Vascular Surgery

## 2015-05-04 ENCOUNTER — Ambulatory Visit (HOSPITAL_COMMUNITY)
Admission: RE | Admit: 2015-05-04 | Discharge: 2015-05-04 | Disposition: A | Discharge status: Home / Self Care | Payer: 59 | Source: Ambulatory Visit | Attending: Vascular Surgery | Admitting: Vascular Surgery

## 2015-05-04 ENCOUNTER — Ambulatory Visit (INDEPENDENT_AMBULATORY_CARE_PROVIDER_SITE_OTHER): Payer: 59 | Admitting: Vascular Surgery

## 2015-05-04 ENCOUNTER — Encounter: Payer: Self-pay | Admitting: Vascular Surgery

## 2015-05-04 VITALS — BP 122/80 | HR 57 | Ht 69.25 in | Wt 154.0 lb

## 2015-05-04 DIAGNOSIS — I83893 Varicose veins of bilateral lower extremities with other complications: Secondary | ICD-10-CM | POA: Diagnosis not present

## 2015-05-04 DIAGNOSIS — I83813 Varicose veins of bilateral lower extremities with pain: Secondary | ICD-10-CM | POA: Diagnosis not present

## 2015-05-04 NOTE — Progress Notes (Signed)
[facility] [place] HISTORY AND PHYSICAL   History of Present Illness:  Patient is a 44 y.o. year old male who presents for evaluation of bilateral lower extremity varicose veins. Patient overall is very active. He has some aching and fullness in his legs when exercising. However, he does not really have any significant swelling. He states his right leg is worse than his left. He has also noticed development of multiple varicosities in both lower extremities. He denies any prior history of DVT. He does not have family history of varicose veins. He has never had any skin breakdown.  Other medical problems include Crohn's disease and migraines both of which are currently stable.  Past Medical History  Diagnosis Date  . Incisional hernia   . Osteopenia   . Arthropathy     and scroilitis  . Thyroid disease   . Chronic drug-induced interstitial lung disorders   . IBD (inflammatory bowel disease)   . Herniated disc   . Vitamin D deficiency   . Dysplastic nevi   . Migraine headache   . Crohn's ileocolitis 08/29/2008  . Pouchitis 2006  . History of proctitis 2006  . Morton's neuroma of right foot   . Small bowel obstruction due to adhesions 02/09/2015    Past Surgical History  Procedure Laterality Date  . Colectomy    . Ileoanal pull-through    . Functional endoscopic sinus surgery  12/2003    partial ethmoidectomy.  Dr Wilburn Cornelia  . Colonoscopy w/ biopsies  04/16/2006    crohn's colitis  . Flexible sigmoidoscopy  2006 - 02/2014    ileocolitis, pouchitis.  with pouch ulcer.   . Sacroilitis    . Sigmoidoscopy  01/2005  . Ventral hernia repair  06/2014    Social History Social History  Substance Use Topics  . Smoking status: Never Smoker   . Smokeless tobacco: Never Used  . Alcohol Use: 8.4 oz/week    14 Glasses of wine per week     Comment: couple of drinks daily    Family History Family History  Problem Relation Age of Onset  . Colon cancer     grandmother  . Ulcerative colitis Mother     Allergies  Allergies  Allergen Reactions  . Mercaptopurine Nausea And Vomiting    Felt ill within 2 days of initiating therapy.     Current Outpatient Prescriptions  Medication Sig Dispense Refill  . albuterol (PROVENTIL HFA;VENTOLIN HFA) 108 (90 BASE) MCG/ACT inhaler Inhale 2 puffs into the lungs every 6 (six) hours as needed. 18 g 3  . ANUSOL-HC 25 MG suppository Place 1 suppository (25 mg total) rectally at bedtime as needed for hemorrhoids or itching. 24 suppository 0  . dexlansoprazole (DEXILANT) 60 MG capsule Take 60 mg by mouth daily.     . diphenoxylate-atropine (LOMOTIL) 2.5-0.025 MG per tablet Take 1 tablet by mouth 4 (four) times daily as needed for diarrhea or loose stools (before meals and at bedtime if needed). 120 tablet 0  . HUMIRA PEN 40 MG/0.8ML PNKT INJECT 40 MG SUBCUTANEOUSLY EVERY 2 WEEKS -REFRIGERATE DO NOT FREEZE 2 each 5  . ibuprofen (ADVIL,MOTRIN) 200 MG tablet Take 400 mg by mouth every 6 (six) hours as needed (For pain.).     Marland Kitchen LORazepam (ATIVAN) 1 MG tablet Take 1 tablet (1 mg total) by mouth at bedtime as needed. 90 tablet 1  . mometasone-formoterol (DULERA) 100-5 MCG/ACT AERO Inhale 2 puffs into the lungs daily. Inhale 2 puffs into the  lungs 2 times daily (Patient taking differently: Inhale 2 puffs into the lungs 2 (two) times daily. ) 4 Inhaler 3  . Probiotic Product (VSL#3 DS) PACK Take 1 each by mouth daily. 30 each 11  . zolmitriptan (ZOMIG) 5 MG tablet 1 tablet at onset of migraine. 1 tab 2 hours as needed for migraine 10 tablet 3  . cephALEXin (KEFLEX) 500 MG capsule Take 1,000 mg by mouth 2 (two) times daily. He is to take for 10 days. He started on 02/02/15 and has not completed.    Marland Kitchen HYDROcodone-acetaminophen (NORCO/VICODIN) 5-325 MG per tablet Take 1 tablet by mouth every 6 (six) hours as needed for moderate pain or severe pain. (Patient not taking: Reported on 05/04/2015) 30 tablet 0   No current  facility-administered medications for this visit.    ROS:   General:  No weight loss, Fever, chills  HEENT: No recent headaches, no nasal bleeding, no visual changes, no sore throat  Neurologic: No dizziness, blackouts, seizures. No recent symptoms of stroke or mini- stroke. No recent episodes of slurred speech, or temporary blindness.  Cardiac: No recent episodes of chest pain/pressure, no shortness of breath at rest.  No shortness of breath with exertion.  Denies history of atrial fibrillation or irregular heartbeat  Vascular: No history of rest pain in feet.  No history of claudication.  No history of non-healing ulcer, No history of DVT   Pulmonary: No home oxygen, no productive cough, no hemoptysis,  + asthma or wheezing  Musculoskeletal:  [ ]  Arthritis, [ ]  Low back pain,  [ ]  Joint pain  Hematologic:No history of hypercoagulable state.  No history of easy bleeding.  No history of anemia  Gastrointestinal: No hematochezia or melena,  No gastroesophageal reflux, no trouble swallowing  Urinary: [ ]  chronic Kidney disease, [ ]  on HD - [ ]  MWF or [ ]  TTHS, [ ]  Burning with urination, [ ]  Frequent urination, [ ]  Difficulty urinating;   Skin: No rashes  Psychological: No history of anxiety,  No history of depression   Physical Examination  Filed Vitals:   05/04/15 1025  BP: 122/80  Pulse: 57  Height: 5' 9.25" (1.759 m)  Weight: 154 lb (69.854 kg)  SpO2: 100%    Body mass index is 22.58 kg/(m^2).  General:  Alert and oriented, no acute distress HEENT: Normal Cardiac: Regular Rate and Rhythm without murmur Skin: No rash, multiple varicosities 4-5 mm diameter right posterior calf left posterior calf Extremity Pulses:  2+ radial, brachial dorsalis pedis, posterior tibial pulses bilaterally Musculoskeletal: No deformity or edema  Neurologic: Upper and lower extremity motor 5/5 and symmetric  DATA:  Patient had bilateral lower extremity venous reflux exam today. This  showed no evidence of DVT. He did have reflux in the left greater saphenous vein with vein diameter 4-5 mm with mild left common femoral reflux, right lower extremity showed no superficial venous reflux but again mild reflux of the right common femoral vein   ASSESSMENT:  Bilateral symptomatically varicose veins with evidence of superficial reflux on the left side   PLAN:  Patient was given a prescription today for bilateral lower extremity compression stockings. He will wear these as much as possible to try to improve symptoms. I also had the patient seen by my partner Dr. Kellie Simmering who also does laser ablation here in our office. Both discussed with him the possibility of a laser ablation in the future. He will think about it currently. He will follow-up in Dr.  Lawson's office in 3 months if he wishes to proceed with laser ablation.  Ruta Hinds, MD [facility] [place] Office: [phone] Pager: [phone]

## 2015-05-09 ENCOUNTER — Other Ambulatory Visit: Payer: Self-pay | Admitting: Otolaryngology

## 2015-05-09 DIAGNOSIS — J329 Chronic sinusitis, unspecified: Secondary | ICD-10-CM

## 2015-05-11 ENCOUNTER — Telehealth: Payer: Self-pay | Admitting: Family Medicine

## 2015-05-11 ENCOUNTER — Other Ambulatory Visit: Payer: Self-pay | Admitting: Family Medicine

## 2015-05-11 MED ORDER — MOMETASONE FURO-FORMOTEROL FUM 100-5 MCG/ACT IN AERO: 2.0000 | INHALATION_SPRAY | Freq: Every day | RESPIRATORY_TRACT | Status: DC

## 2015-05-11 NOTE — Telephone Encounter (Signed)
Pt request refill of the following: mometasone-formoterol (DULERA) 100-5 MCG/ACT AERO   Phamacy: [facility] [address]

## 2015-05-17 ENCOUNTER — Ambulatory Visit
Admission: RE | Admit: 2015-05-17 | Discharge: 2015-05-17 | Disposition: A | Discharge status: Home / Self Care | Payer: 59 | Source: Ambulatory Visit | Attending: Otolaryngology | Admitting: Otolaryngology

## 2015-05-17 DIAGNOSIS — J329 Chronic sinusitis, unspecified: Secondary | ICD-10-CM

## 2015-05-24 ENCOUNTER — Other Ambulatory Visit (INDEPENDENT_AMBULATORY_CARE_PROVIDER_SITE_OTHER): Payer: 59 | Admitting: Family Medicine

## 2015-05-24 DIAGNOSIS — Z0000 Encounter for general adult medical examination without abnormal findings: Secondary | ICD-10-CM | POA: Diagnosis not present

## 2015-05-24 DIAGNOSIS — Z23 Encounter for immunization: Secondary | ICD-10-CM

## 2015-05-26 LAB — TB SKIN TEST
Induration: 0 mm
TB Skin Test: NEGATIVE

## 2015-06-13 ENCOUNTER — Encounter: Payer: Self-pay | Admitting: Internal Medicine

## 2015-06-19 ENCOUNTER — Telehealth: Payer: Self-pay

## 2015-06-19 NOTE — Telephone Encounter (Signed)
-----   Message from Gatha Mayer, MD sent at 06/19/2015  8:53 AM EDT ----- Regarding: needs sigmoidoscopy Please arrange a direct sigmoidoscopy to assess his Crohn's disease  We should do prep like we have done in past   We can also cancel his dec f/u appt   We communicated by My Chart if you need any other info

## 2015-06-19 NOTE — Telephone Encounter (Signed)
Left message for patient to call back  

## 2015-06-20 NOTE — Telephone Encounter (Signed)
Patient notified He is scheduled for 06/27/15 4:00 and will come today for pre-visit at 4:00

## 2015-06-23 ENCOUNTER — Ambulatory Visit (AMBULATORY_SURGERY_CENTER): Payer: Self-pay

## 2015-06-23 VITALS — Ht 70.0 in | Wt 158.0 lb

## 2015-06-23 DIAGNOSIS — K50919 Crohn's disease, unspecified, with unspecified complications: Secondary | ICD-10-CM

## 2015-06-23 NOTE — Progress Notes (Signed)
No egg or soy allergy.  No previous complications from anesthesia. No home O2. No diet meds.

## 2015-06-26 ENCOUNTER — Other Ambulatory Visit: Payer: Self-pay | Admitting: Family Medicine

## 2015-06-27 ENCOUNTER — Encounter: Payer: Self-pay | Admitting: Internal Medicine

## 2015-06-27 ENCOUNTER — Ambulatory Visit (AMBULATORY_SURGERY_CENTER): Payer: 59 | Admitting: Internal Medicine

## 2015-06-27 VITALS — BP 122/87 | HR 64 | Temp 97.0°F | Resp 17 | Ht 70.0 in | Wt 158.0 lb

## 2015-06-27 DIAGNOSIS — K91850 Pouchitis: Secondary | ICD-10-CM | POA: Diagnosis not present

## 2015-06-27 DIAGNOSIS — K50919 Crohn's disease, unspecified, with unspecified complications: Secondary | ICD-10-CM

## 2015-06-27 MED ORDER — SODIUM CHLORIDE 0.9 % IV SOLN: 500.0000 mL | INTRAVENOUS | Status: DC

## 2015-06-27 MED ORDER — CELECOXIB 100 MG PO CAPS: 100.0000 mg | ORAL_CAPSULE | Freq: Two times a day (BID) | ORAL | Status: DC | PRN

## 2015-06-27 NOTE — Progress Notes (Signed)
Called to room to assist during endoscopic procedure.  Patient ID and intended procedure confirmed with present staff. Received instructions for my participation in the procedure from the performing physician.  

## 2015-06-27 NOTE — Patient Instructions (Addendum)
I think you should follow-up with a rheumatologist re: joint symptoms.  The pouch looks like it did in 2013 - I took biopsies. I will let you know results.  I would retry VSL # 3 for a month or so to see if it helps the fullness symptoms.  I appreciate the opportunity to care for you. Gatha Mayer, MD, [facility]  Discharge instructions given. Biopsies taken. Resume previous medications. YOU HAD AN ENDOSCOPIC PROCEDURE TODAY AT [facility]:   Refer to the procedure report that was given to you for any specific questions about what was found during the examination.  If the procedure report does not answer your questions, please call your gastroenterologist to clarify.  If you requested that your care partner not be given the details of your procedure findings, then the procedure report has been included in a sealed envelope for you to review at your convenience later.  YOU SHOULD EXPECT: Some feelings of bloating in the abdomen. Passage of more gas than usual.  Walking can help get rid of the air that was put into your GI tract during the procedure and reduce the bloating. If you had a lower endoscopy (such as a colonoscopy or flexible sigmoidoscopy) you may notice spotting of blood in your stool or on the toilet paper. If you underwent a bowel prep for your procedure, you may not have a normal bowel movement for a few days.  Please Note:  You might notice some irritation and congestion in your nose or some drainage.  This is from the oxygen used during your procedure.  There is no need for concern and it should clear up in a day or so.  SYMPTOMS TO REPORT IMMEDIATELY:   Following lower endoscopy (colonoscopy or flexible sigmoidoscopy):  Excessive amounts of blood in the stool  Significant tenderness or worsening of abdominal pains  Swelling of the abdomen that is new, acute  Fever of 100F or higher   For urgent or emergent issues, a gastroenterologist can be  reached at any hour by calling [phone].   DIET: Your first meal following the procedure should be a small meal and then it is ok to progress to your normal diet. Heavy or fried foods are harder to digest and may make you feel nauseous or bloated.  Likewise, meals heavy in dairy and vegetables can increase bloating.  Drink plenty of fluids but you should avoid alcoholic beverages for 24 hours.  ACTIVITY:  You should plan to take it easy for the rest of today and you should NOT DRIVE or use heavy machinery until tomorrow (because of the sedation medicines used during the test).    FOLLOW UP: Our staff will call the number listed on your records the next business day following your procedure to check on you and address any questions or concerns that you may have regarding the information given to you following your procedure. If we do not reach you, we will leave a message.  However, if you are feeling well and you are not experiencing any problems, there is no need to return our call.  We will assume that you have returned to your regular daily activities without incident.  If any biopsies were taken you will be contacted by phone or by letter within the next 1-3 weeks.  Please call us at [phone] if you have not heard about the biopsies in 3 weeks.    SIGNATURES/CONFIDENTIALITY: You and/or your care partner have signed  paperwork which will be entered into your electronic medical record.  These signatures attest to the fact that that the information above on your After Visit Summary has been reviewed and is understood.  Full responsibility of the confidentiality of this discharge information lies with you and/or your care-partner.

## 2015-06-27 NOTE — Op Note (Signed)
[place]  [address] [place], [address]   FLEXIBLE SIGMOIDOSCOPY PROCEDURE REPORT  PATIENT: Charles [name], Charles [name]  MR#: [id number] BIRTHDATE: [birth year] , 44  yrs. 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 MEDICATIONS: Propofol 200 mg IV and Monitored anesthesia care  DESCRIPTION OF PROCEDURE:   After the risks benefits and alternatives of the procedure were thoroughly explained, informed consent was obtained.  Digital exam revealed stenosis of the anal canal and Digital exam revealed tenderness of the rectum. The LB [id number]  endoscope was introduced through the anus  and advanced to the sigmoid colon , The exam was Without limitations. The quality of the prep was The overall prep quality was good. . Estimated blood loss is zero unless otherwise noted in this procedure report. The instrument was then slowly withdrawn as the mucosa was fully examined.         COLON FINDINGS: 1) A few tiny ulcers in the pouch which otherwise looked ok (no photo of ulcers) Biopsies taken  2) NL otherwise including 100 cm of ileum.    Retroflexion was not performed due to a narrow rectal vault.    The scope was then withdrawn from the patient and the procedure terminated.  COMPLICATIONS: There were no immediate complications.  ENDOSCOPIC IMPRESSION: 1) A few tiny ulcers in the pouch which otherwise looked ok - biopsied - Mild pouchitis. Looks like it did in 2013 when he was well, off remicade but before Humira 2) NL otherwise including 100 cm of ileum  RECOMMENDATIONS: 1.  Try VSL # 3 for a month to see if it helps with bloating 2.  See rheumatology re: joint complaints - he can let us know if he needs a referral 3.  Continue Humira 4.  Will call biopsy results   eSigned:  Gatha Mayer, MD, [facility] 06/27/2015 4:38  PM   CC:The Patient and Dr. Christie Nottingham

## 2015-06-27 NOTE — Progress Notes (Signed)
A/ox3 pleased with MAC, report to Celia RN 

## 2015-06-28 ENCOUNTER — Encounter: Payer: Self-pay | Admitting: Family Medicine

## 2015-06-28 ENCOUNTER — Telehealth: Payer: Self-pay | Admitting: *Deleted

## 2015-06-28 NOTE — Telephone Encounter (Signed)
No answer. Name identifier. Message left to call if questions or concerns.

## 2015-07-05 ENCOUNTER — Ambulatory Visit (INDEPENDENT_AMBULATORY_CARE_PROVIDER_SITE_OTHER): Payer: 59 | Admitting: Family Medicine

## 2015-07-05 ENCOUNTER — Other Ambulatory Visit (INDEPENDENT_AMBULATORY_CARE_PROVIDER_SITE_OTHER): Payer: 59

## 2015-07-05 ENCOUNTER — Encounter: Payer: Self-pay | Admitting: Family Medicine

## 2015-07-05 VITALS — BP 118/80 | HR 79 | Ht 69.25 in | Wt 156.0 lb

## 2015-07-05 DIAGNOSIS — M2550 Pain in unspecified joint: Secondary | ICD-10-CM | POA: Diagnosis not present

## 2015-07-05 DIAGNOSIS — M461 Sacroiliitis, not elsewhere classified: Secondary | ICD-10-CM

## 2015-07-05 DIAGNOSIS — M9902 Segmental and somatic dysfunction of thoracic region: Secondary | ICD-10-CM | POA: Diagnosis not present

## 2015-07-05 DIAGNOSIS — M9908 Segmental and somatic dysfunction of rib cage: Secondary | ICD-10-CM | POA: Diagnosis not present

## 2015-07-05 DIAGNOSIS — E559 Vitamin D deficiency, unspecified: Secondary | ICD-10-CM | POA: Diagnosis not present

## 2015-07-05 DIAGNOSIS — M9903 Segmental and somatic dysfunction of lumbar region: Secondary | ICD-10-CM | POA: Diagnosis not present

## 2015-07-05 DIAGNOSIS — M999 Biomechanical lesion, unspecified: Secondary | ICD-10-CM

## 2015-07-05 LAB — CBC WITH DIFFERENTIAL/PLATELET
Basophils Absolute: 0 10*3/uL (ref 0.0–0.1)
Basophils Relative: 0.3 % (ref 0.0–3.0)
Eosinophils Absolute: 0.1 10*3/uL (ref 0.0–0.7)
Eosinophils Relative: 1.6 % (ref 0.0–5.0)
HCT: 35 % — ABNORMAL LOW (ref 39.0–52.0)
Hemoglobin: 11.2 g/dL — ABNORMAL LOW (ref 13.0–17.0)
LYMPHS ABS: 2.2 10*3/uL (ref 0.7–4.0)
Lymphocytes Relative: 26.2 % (ref 12.0–46.0)
MCHC: 32.1 g/dL (ref 30.0–36.0)
MCV: 74.3 fl — AB (ref 78.0–100.0)
MONO ABS: 0.7 10*3/uL (ref 0.1–1.0)
MONOS PCT: 8.2 % (ref 3.0–12.0)
NEUTROS PCT: 63.7 % (ref 43.0–77.0)
Neutro Abs: 5.3 10*3/uL (ref 1.4–7.7)
PLATELETS: 345 10*3/uL (ref 150.0–400.0)
RBC: 4.71 Mil/uL (ref 4.22–5.81)
RDW: 17.5 % — AB (ref 11.5–15.5)
WBC: 8.4 10*3/uL (ref 4.0–10.5)

## 2015-07-05 LAB — IBC PANEL
Iron: 30 ug/dL — ABNORMAL LOW (ref 42–165)
Saturation Ratios: 6.1 % — ABNORMAL LOW (ref 20.0–50.0)
TRANSFERRIN: 354 mg/dL (ref 212.0–360.0)

## 2015-07-05 LAB — TESTOSTERONE: TESTOSTERONE: 608.15 ng/dL (ref 300.00–890.00)

## 2015-07-05 LAB — VITAMIN B12: Vitamin B-12: 871 pg/mL (ref 211–911)

## 2015-07-05 LAB — T4, FREE: FREE T4: 0.78 ng/dL (ref 0.60–1.60)

## 2015-07-05 LAB — VITAMIN D 25 HYDROXY (VIT D DEFICIENCY, FRACTURES): VITD: 43.39 ng/mL (ref 30.00–100.00)

## 2015-07-05 LAB — TSH: TSH: 1.57 u[IU]/mL (ref 0.35–4.50)

## 2015-07-05 NOTE — Assessment & Plan Note (Signed)
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 Patient is already on eye [address] secondary to his Crohn's disease. I do think that further workup for any type of nutritional imbalances could be beneficial. We'll check his thyroid as well which does go along with a inflammatory process. We discussed with patient that I do not see any type of infectious etiology but we will get a CBC. Patient is taking a selective anti-inflammatory when needed. Patient has tried over-the-counter natural supplements but this did cause some stomach discomfort. Depending on lab results we will discuss if there is anything else that needs to be done. Continues to respond fairly well to osteopathic manipulation and we'll needed when patient increases his activity.

## 2015-07-05 NOTE — Patient Instructions (Addendum)
Good to see you Charles [name] is your friend.  We manipulated you again Lets get labs today and I will release them again   Try to stay hydrated See me again in 4-6 weeks r more manipulation.

## 2015-07-05 NOTE — Assessment & Plan Note (Signed)
Decision today to treat with OMT was based on Physical Exam  After verbal consent patient was treated with HVLA, ME techniques in thoracici, rib and lumbar areas  Patient tolerated the procedure well with improvement in symptoms  Patient given exercises, stretches and lifestyle modifications  See medications in patient instructions if given  Patient will follow up in 4-6 weeks

## 2015-07-05 NOTE — Progress Notes (Signed)
Quick Note:  Some inflammation seen Will discuss - no recall or letter needed My Chart message sent ______

## 2015-07-05 NOTE — Assessment & Plan Note (Signed)
Discussed the possible need for once weekly vitamin D supplementation again.

## 2015-07-05 NOTE — Progress Notes (Signed)
Pre visit review using our clinic review tool, if applicable. No additional management support is needed unless otherwise documented below in the visit note. 

## 2015-07-05 NOTE — Progress Notes (Signed)
  [name] [address] [place], [place] [address] Phone: [phone] Subjective:    CC:  recurrent foot pain, back pain follow up  EUM:[id number] Charles [name] is a 44 y.o. male coming in recurrent foot pain.     patient states that his upper back pain and seems to be doing relatively well. Patient has noticed some tightness. Patient is responding well to osteopathic manipulation previously. Patient states that he has not needed recently but now is trying to increase his activity has noticed some more tightness.   history of Crohn's disease. Patient is on vitamin D supplementation.  Patient does have some significant fatigue as well as the posterior calf cramping. Patient has been seen and worked up for varicose veins recently. Patient since his last hospitalization for a Crohn's flare has not been able to start increasing his activity like he used to. Patient states that he has chronic fatigue. States that nothing seems to be helping. He is watching his diet in attempting to stay hydrated. Patient states when he tries to run weight used to be a 6-1/2 minute mile is now taking nearly 9 minutes. Patient states that it is impossible for him to increase his activity anymore. Continues to bike at least 3 or 4 times a week and is lifting weights 102 times a week. Denies any significant weakness but just feels that he hits his threshold significantly earlier. Denies any fevers or chills. Continues to have some difficulty with his stomach.     Past medical history, social, surgical and family history all reviewed in electronic medical record.   Review of Systems: No headache, visual changes, nausea, vomiting, diarrhea, constipation, dizziness, abdominal pain, skin rash, fevers, chills, night sweats, weight loss, swollen lymph nodes, body aches, joint swelling, muscle aches, chest pain, shortness of breath, mood changes.   Objective Blood pressure 118/80, pulse 79,  height 5' 9.25" (1.759 m), weight 156 lb (70.761 kg), SpO2 99 %.  General: No apparent distress alert and oriented x3 mood and affect normal, dressed appropriately.  HEENT: Pupils equal, extraocular movements intact  Respiratory: Patient's speak in full sentences and does not appear short of breath  Cardiovascular: No lower extremity edema, non tender, no erythema  Skin: Warm dry intact with no signs of infection or rash on extremities or on axial skeleton.  Abdomen: Soft nontender  Neuro: Cranial nerves II through XII are intact, neurovascularly intact in all extremities with 2+ DTRs and 2+ pulses.  Lymph: No lymphadenopathy of posterior or anterior cervical chain or axillae bilaterally.  Gait analysis shows the patient does have knees track over the medial line. MSK:  Non tender with full range of motion and good stability and symmetric strength and tone of shoulders, elbows,  hip, knee and ankles bilaterally.  Calf exam is unremarkable except for some mild varicose veins bilaterally left greater than right. Patient is nontender on exam.  Running gait shows the patient has very a sedation running style with normal mid foot strike.    Osteopathic findings  cervical  C3 flexed rotated and side bent right Thoracic T3 extended rotated and side bent right with inhaled Lumbar L2 flexed rotated and side bent right  Impression and Recommendations:

## 2015-07-09 LAB — VITAMIN B6: Vitamin B6: 26.2 ng/mL — ABNORMAL HIGH (ref 2.1–21.7)

## 2015-07-10 ENCOUNTER — Encounter: Payer: Self-pay | Admitting: Family Medicine

## 2015-07-11 LAB — DHEA: DHEA: 108 ng/dL (ref 61–1636)

## 2015-07-16 ENCOUNTER — Telehealth: Payer: Self-pay | Admitting: Internal Medicine

## 2015-07-16 NOTE — Telephone Encounter (Signed)
He had a misfire and used next Humira Pen  I told him we would see if we can get him a replacement or help with that somehow.

## 2015-07-17 NOTE — Telephone Encounter (Signed)
I spoke with the Doy Mince (HUmira) patient needs to contact the company at [phone] and report the misfire.  He will be sent a replacement pen.  I did contact the patient and left a detailed message on his voicemail with these instructions.

## 2015-07-19 ENCOUNTER — Encounter: Payer: Self-pay | Admitting: Vascular Surgery

## 2015-07-21 ENCOUNTER — Telehealth: Payer: Self-pay | Admitting: Internal Medicine

## 2015-07-21 NOTE — Telephone Encounter (Signed)
I left a voicemail on the phone number to the pharmacy line authorizing a replacement of Humira 40 mg 1 sq every 14 days.

## 2015-07-24 ENCOUNTER — Ambulatory Visit: Payer: 59 | Admitting: Vascular Surgery

## 2015-07-25 ENCOUNTER — Ambulatory Visit: Payer: 59 | Admitting: Vascular Surgery

## 2015-07-28 ENCOUNTER — Encounter: Payer: Self-pay | Admitting: Family Medicine

## 2015-08-01 ENCOUNTER — Encounter: Payer: Self-pay | Admitting: Family Medicine

## 2015-08-01 ENCOUNTER — Ambulatory Visit (INDEPENDENT_AMBULATORY_CARE_PROVIDER_SITE_OTHER): Payer: 59 | Admitting: Family Medicine

## 2015-08-01 ENCOUNTER — Ambulatory Visit (INDEPENDENT_AMBULATORY_CARE_PROVIDER_SITE_OTHER)
Admission: RE | Admit: 2015-08-01 | Discharge: 2015-08-01 | Disposition: A | Discharge status: Home / Self Care | Payer: 59 | Source: Ambulatory Visit | Attending: Family Medicine | Admitting: Family Medicine

## 2015-08-01 VITALS — BP 122/80 | HR 74 | Ht 69.25 in | Wt 154.0 lb

## 2015-08-01 DIAGNOSIS — M25571 Pain in right ankle and joints of right foot: Secondary | ICD-10-CM

## 2015-08-01 DIAGNOSIS — M999 Biomechanical lesion, unspecified: Secondary | ICD-10-CM

## 2015-08-01 DIAGNOSIS — G8929 Other chronic pain: Secondary | ICD-10-CM | POA: Insufficient documentation

## 2015-08-01 DIAGNOSIS — M9902 Segmental and somatic dysfunction of thoracic region: Secondary | ICD-10-CM

## 2015-08-01 DIAGNOSIS — M9903 Segmental and somatic dysfunction of lumbar region: Secondary | ICD-10-CM

## 2015-08-01 DIAGNOSIS — M9908 Segmental and somatic dysfunction of rib cage: Secondary | ICD-10-CM

## 2015-08-01 NOTE — Patient Instructions (Signed)
Really appreciate you coming in We will get the MRI Stay active and continue current therapy Will release the result as soon as I get them and discuss what we can do.  Happy holidays!

## 2015-08-01 NOTE — Assessment & Plan Note (Signed)
Continues to give pain, failed all conservative therapy including formal physical therapy over the years. Patient has seen other providers were previously. Patient has had a known bone fragment he states on the medial aspect. X-rays ordered today. This is giving him some mild instability. Patient is having did change his daily activities. I would consider also advanced imaging if x-rays are fairly inconclusive. MRI would likely be ordered as well. We discussed continuing icing regimen and avoiding any significant running until results are known. Patient will come back and see me again after the results and we'll discuss in further detail.  Spent  25 minutes with patient face-to-face and had greater than 50% of counseling including as described above in assessment and plan.

## 2015-08-01 NOTE — Progress Notes (Signed)
[name] [address] [place], [place] [address] Phone: [phone] Subjective:    CC:  recurrent foot pain, back pain follow up  [id number] Zenon Leaf is a 44 y.o. male coming in recurrent foot pain.     patient states that his upper back pain and seems to be doing relatively well. Patient has noticed some tightness. Patient is responding well to osteopathic manipulation previously. Has been running more regularly. Has noticed some increasing stiffness.   history of Crohn's disease. Patient is on vitamin D supplementation.  Patient does have some significant fatigue as well as the posterior calf cramping.  Patient has had a history of a Morton's neuroma. Patient also from an outside facility used to have a bone fragment on the medial aspect of the talus that was being monitored. It has been 5 years since last evaluation. Patient describes the pain as a dull, throbbing aching sensation. Feels very similar to when this was giving her more difficulty. Sometimes gives him a catching motion. Has failed all other conservative therapies including injections which were done multiple months ago. We have not gotten a repeat x-ray at this time. This is starting to affect his running as well as some of his daily activities. Patient rates the severity of pain a 7 out of 10. Concern that possibly this bone fragment has become loose. Continues to have some of the numbness with his toes.     Past medical history, social, surgical and family history all reviewed in electronic medical record.   Review of Systems: No headache, visual changes, nausea, vomiting, diarrhea, constipation, dizziness, abdominal pain, skin rash, fevers, chills, night sweats, weight loss, swollen lymph nodes, body aches, joint swelling, muscle aches, chest pain, shortness of breath, mood changes.   Objective Blood pressure 122/80, pulse 74, height 5' 9.25" (1.759 m), weight 154 lb (69.854 kg),  SpO2 98 %.  General: No apparent distress alert and oriented x3 mood and affect normal, dressed appropriately.  HEENT: Pupils equal, extraocular movements intact  Respiratory: Patient's speak in full sentences and does not appear short of breath  Cardiovascular: No lower extremity edema, non tender, no erythema  Skin: Warm dry intact with no signs of infection or rash on extremities or on axial skeleton.  Abdomen: Soft nontender  Neuro: Cranial nerves II through XII are intact, neurovascularly intact in all extremities with 2+ DTRs and 2+ pulses.  Lymph: No lymphadenopathy of posterior or anterior cervical chain or axillae bilaterally.  Gait analysis shows the patient does have knees track over the medial line. MSK:  Non tender with full range of motion and good stability and symmetric strength and tone of shoulders, elbows,  hip, knee and bilaterally.   Ankle:right No visible erythema or swelling. Mild decrease in range of motion. This is change from previous exam. Lacks last 5 of dorsiflexion but does have full plantar flexion. Strength is 4/5 in all directions. Stable lateral and medial ligaments; squeeze test and kleiger test unremarkable; Talar dome minorly tender on the medial aspect No pain at base of 5th MT; No tenderness over cuboid; No tenderness over N spot or navicular prominence Mild tenderness over the posterior tibialis tendon. No sign of peroneal tendon subluxations or tenderness to palpation Negative tarsal tunnel tinel's Able to walk 4 steps. Contralateral ankle unremarkable.      Osteopathic findings  cervical C3 flexed rotated and side bent right Thoracic T3 extended rotated and side bent right with inhaled T5 extended rotated  and side bent left Lumbar L2 flexed rotated and side bent right  Impression and Recommendations:

## 2015-08-01 NOTE — Progress Notes (Signed)
Pre visit review using our clinic review tool, if applicable. No additional management support is needed unless otherwise documented below in the visit note. 

## 2015-08-01 NOTE — Assessment & Plan Note (Signed)
Decision today to treat with OMT was based on Physical Exam  After verbal consent patient was treated with HVLA, ME techniques in thoracici, rib and lumbar areas  Patient tolerated the procedure well with improvement in symptoms  Patient given exercises, stretches and lifestyle modifications  See medications in patient instructions if given  Patient will follow up in 4-6 weeks

## 2015-08-08 ENCOUNTER — Other Ambulatory Visit: Payer: Self-pay | Admitting: Family Medicine

## 2015-08-08 ENCOUNTER — Inpatient Hospital Stay
Admission: RE | Admit: 2015-08-08 | Discharge: 2015-08-08 | Disposition: A | Discharge status: Home / Self Care | Payer: Self-pay | Source: Ambulatory Visit | Attending: Family Medicine | Admitting: Family Medicine

## 2015-08-08 ENCOUNTER — Ambulatory Visit
Admission: RE | Admit: 2015-08-08 | Discharge: 2015-08-08 | Disposition: A | Discharge status: Home / Self Care | Payer: 59 | Source: Ambulatory Visit | Attending: Family Medicine | Admitting: Family Medicine

## 2015-08-08 DIAGNOSIS — M25571 Pain in right ankle and joints of right foot: Secondary | ICD-10-CM

## 2015-08-09 ENCOUNTER — Ambulatory Visit: Payer: 59 | Admitting: Internal Medicine

## 2015-08-09 ENCOUNTER — Encounter: Payer: Self-pay | Admitting: Family Medicine

## 2015-08-10 ENCOUNTER — Other Ambulatory Visit (INDEPENDENT_AMBULATORY_CARE_PROVIDER_SITE_OTHER): Payer: 59

## 2015-08-10 ENCOUNTER — Encounter: Payer: Self-pay | Admitting: Family Medicine

## 2015-08-10 ENCOUNTER — Ambulatory Visit (INDEPENDENT_AMBULATORY_CARE_PROVIDER_SITE_OTHER): Payer: 59 | Admitting: Family Medicine

## 2015-08-10 VITALS — BP 128/82 | HR 102 | Ht 69.25 in | Wt 154.0 lb

## 2015-08-10 DIAGNOSIS — G8929 Other chronic pain: Secondary | ICD-10-CM

## 2015-08-10 DIAGNOSIS — M25571 Pain in right ankle and joints of right foot: Secondary | ICD-10-CM | POA: Diagnosis not present

## 2015-08-10 DIAGNOSIS — Q688 Other specified congenital musculoskeletal deformities: Secondary | ICD-10-CM | POA: Insufficient documentation

## 2015-08-10 DIAGNOSIS — G5761 Lesion of plantar nerve, right lower limb: Secondary | ICD-10-CM

## 2015-08-10 DIAGNOSIS — M76821 Posterior tibial tendinitis, right leg: Secondary | ICD-10-CM | POA: Diagnosis not present

## 2015-08-10 DIAGNOSIS — M76829 Posterior tibial tendinitis, unspecified leg: Secondary | ICD-10-CM | POA: Insufficient documentation

## 2015-08-10 DIAGNOSIS — Q742 Other congenital malformations of lower limb(s), including pelvic girdle: Secondary | ICD-10-CM | POA: Diagnosis not present

## 2015-08-10 NOTE — Progress Notes (Signed)
Pre visit review using our clinic review tool, if applicable. No additional management support is needed unless otherwise documented below in the visit note. 

## 2015-08-10 NOTE — Assessment & Plan Note (Signed)
Given an injection today and tolerated the procedure very well. Patient did have good resolution of pain. 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 Patient has failed all other conservative therapy including numerous rounds of formal physical therapy, multiple medications and if he fails this injection and do feel that surgical intervention could be a possibility. Patient will be referred to a surgeon to discuss possible intervention. Patient knows though that if this does have pain we can repeat this every 3-4 months if necessary. Patient come back and see me again in 3-4 weeks to make sure he is responding appropriate.

## 2015-08-10 NOTE — Progress Notes (Signed)
[name] [address], [place] [address] Phone: [phone] Subjective:    CC:  recurrent foot pain and ankle pain, back pain follow up  JJK:[id number] Charles [name] is a 44 y.o. male coming in recurrent foot pain.        history of Crohn's disease. Patient is on vitamin D supplementation.  Patient does have some significant fatigue as well as the posterior calf cramping.  Patient has had a history of a Morton's neuroma. Patient continued to have right ankle pain. Patient had so much pain that it was affecting his daily activities and keeping him from working out and even giving him trouble with daily activities such as walking. Patient did get an MRI.  MRI was reviewed by me today. 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 These appears stable. No edema to suggest os trigonum syndrome. Moderate fluid in the posterior recess could bedue to local synovitis.      Past medical history, social, surgical and family history all reviewed in electronic medical record.   Review of Systems: No headache, visual changes, nausea, vomiting, diarrhea, constipation, dizziness, abdominal pain, skin rash, fevers, chills, night sweats, weight loss, swollen lymph nodes, body aches, joint swelling, muscle aches, chest pain, shortness of breath, mood changes.   Objective Blood pressure 128/82, pulse 102, height 5' 9.25" (1.759 m), weight 154 lb (69.854 kg), SpO2 98 %.  General: No apparent distress alert and oriented x3 mood and affect normal, dressed appropriately.  HEENT: Pupils equal, extraocular movements intact  Respiratory: Patient's speak in full sentences and does not appear short of breath  Cardiovascular: No lower extremity edema, non tender, no erythema  Skin: Warm dry intact with no signs of infection or rash on extremities or on axial skeleton.  Abdomen: Soft nontender  Neuro: Cranial  nerves II through XII are intact, neurovascularly intact in all extremities with 2+ DTRs and 2+ pulses.  Lymph: No lymphadenopathy of posterior or anterior cervical chain or axillae bilaterally.  Gait analysis shows the patient does have knees track over the medial line. MSK:  Non tender with full range of motion and good stability and symmetric strength and tone of shoulders, elbows,  hip, knee and bilaterally.   Ankle:right No visible erythema or swelling. Mild decrease in range of motion. This is change from previous exam. Lacks last 5 of dorsiflexion but does have full plantar flexion. Strength is 4/5 in all directions. Stable lateral and medial ligaments; squeeze test and kleiger test unremarkable; Talar dome minorly tender on the medial aspect No pain at base of 5th MT; No tenderness over cuboid; No tenderness over N spot or navicular prominence Mild tenderness over the posterior tibialis tendon. No sign of peroneal tendon subluxations or tenderness to palpation Negative tarsal tunnel tinel's Able to walk 4 steps. Contralateral ankle unremarkable.  No significant change from previous exam    Procedure: Real-time Ultrasound Guided Injection of posterior tibialis tendon sheath Device: GE Logiq E  Ultrasound guided injection is preferred based studies that show increased duration, increased effect, greater accuracy, decreased procedural pain, increased response rate, and decreased cost with ultrasound guided versus blind injection.  Verbal informed consent obtained.  Time-out conducted.  Noted no overlying erythema, induration, or other signs of local infection.  Skin prepped in a sterile fashion.  Local anesthesia: Topical Ethyl chloride.  With sterile technique and under real time ultrasound guidance:  With a 25-gauge 1  inch needle patient was injected with a total of 0.5 mL of 0.5% Marcaine as well as 0.5 mL of Kenalog 40 mg/dL. This was injected into the tendon sheath Completed  without difficulty  Pain immediately resolved suggesting accurate placement of the medication.  Advised to call if fevers/chills, erythema, induration, drainage, or persistent bleeding.  Images permanently stored and available for review in the ultrasound unit.  Impression: Technically successful ultrasound guided injection.  Procedure: Real-time Ultrasound Guided Injection of Morton's neuroma Device: GE Logiq E  Ultrasound guided injection is preferred based studies that show increased duration, increased effect, greater accuracy, decreased procedural pain, increased response rate, and decreased cost with ultrasound guided versus blind injection.  Verbal informed consent obtained.  Time-out conducted.  Noted no overlying erythema, induration, or other signs of local infection.  Skin prepped in a sterile fashion.  Local anesthesia: Topical Ethyl chloride.  With sterile technique and under real time ultrasound guidance:  With a 25-gauge 1 inch needle patient was injected with a total of 0.5 mL of Kenalog 40 mg/dL and 0.5 mL of 0.5% Marcaine.done under ultrasound guidance Completed without difficulty  Pain immediately resolved suggesting accurate placement of the medication.  Advised to call if fevers/chills, erythema, induration, drainage, or persistent bleeding.  Images permanently stored and available for review in the ultrasound unit.  Impression: Technically successful ultrasound guided injection.  Impression and Recommendations:

## 2015-08-10 NOTE — Patient Instructions (Signed)
Verbal instructions given

## 2015-08-10 NOTE — Assessment & Plan Note (Signed)
Repeat injection today. Tolerated the procedure well. We discussed icing regimen. Patient will continue to wear proper shoes and likely will need custom orthotics and most of issues.

## 2015-09-20 ENCOUNTER — Other Ambulatory Visit: Payer: Self-pay | Admitting: Internal Medicine

## 2015-09-21 ENCOUNTER — Other Ambulatory Visit: Payer: Self-pay | Admitting: Orthopaedic Surgery

## 2015-09-21 DIAGNOSIS — Q688 Other specified congenital musculoskeletal deformities: Secondary | ICD-10-CM | POA: Insufficient documentation

## 2015-09-21 DIAGNOSIS — G8929 Other chronic pain: Secondary | ICD-10-CM

## 2015-09-21 DIAGNOSIS — M25571 Pain in right ankle and joints of right foot: Principal | ICD-10-CM

## 2015-09-26 ENCOUNTER — Ambulatory Visit
Admission: RE | Admit: 2015-09-26 | Discharge: 2015-09-26 | Disposition: A | Discharge status: Home / Self Care | Payer: 59 | Source: Ambulatory Visit | Attending: Orthopaedic Surgery | Admitting: Orthopaedic Surgery

## 2015-09-26 DIAGNOSIS — G8929 Other chronic pain: Secondary | ICD-10-CM

## 2015-09-26 DIAGNOSIS — M25571 Pain in right ankle and joints of right foot: Principal | ICD-10-CM

## 2015-10-02 ENCOUNTER — Other Ambulatory Visit: Payer: Self-pay | Admitting: Family Medicine

## 2015-10-02 ENCOUNTER — Encounter: Payer: Self-pay | Admitting: Family Medicine

## 2015-10-02 ENCOUNTER — Telehealth: Payer: Self-pay | Admitting: Family Medicine

## 2015-10-02 ENCOUNTER — Ambulatory Visit: Payer: 59 | Admitting: Family Medicine

## 2015-10-02 DIAGNOSIS — M519 Unspecified thoracic, thoracolumbar and lumbosacral intervertebral disc disorder: Secondary | ICD-10-CM

## 2015-10-02 DIAGNOSIS — M5441 Lumbago with sciatica, right side: Secondary | ICD-10-CM

## 2015-10-02 MED ORDER — HYDROCODONE-ACETAMINOPHEN 10-325 MG PO TABS: 1.0000 | ORAL_TABLET | Freq: Four times a day (QID) | ORAL | Status: DC | PRN

## 2015-10-02 NOTE — Progress Notes (Signed)
   Subjective:    Patient ID: Charles [name], male    DOB: 17-Nov-1970, 45 y.o.   MRN: [id number]  HPI Charles [name] is a 46 year old married male nonsmoker who made a house call on Saturday, February 11  He has a history of lumbar disc disease. In the past she's had imaging which showed an L4-L5 bulging disc. This was done because he was having severe back pain at the time. This is been a couple years ago.  Any interim he's been well. He exercises almost daily running and cycling. I weekend still cycle with his friends 20-30 miles. He is in excellent physical shape  He began having low back pain with radiation to his right thigh on Friday night. By Saturday morning the pain was intolerable. He describes it as constant, sharp, rating to his posterior right thigh with numbness in his right foot.  He made a house call. Physical examination shows normal strength reflexes and sensation except the right foot has altered sensation compared with the left. Positive straight leg raising left leg at 30  I felt this juncture he had a L5-S1 bulging disc. We therefore placed him that complete bed rest, Vicodin 7.5 mg dose one half to 1 tablet every 4-6 hours for pain. He did this all day Saturday and Sunday. By Sunday evening pain was not any better. He describes the pain as a 9 on a scale of 1-10. Again it's constant and rates down the posterior portion of his right leg to his foot now.  We also started him on prednisone 40 mg daily   Review of Systems    review of systems otherwise negative no bowel or bladder dysfunction Objective:   Physical Exam   Well-developed well-nourished male in acute distress with severe pain in his back  In the supine position the legs were of equal length. Reflexes normal muscle strength normal SENSATION with numbness in his right foot. Positive straight leg raising left at 30     Assessment & Plan:  L4-L5 bulging disc was severe pain and altered sensation,,,,,,,,,, unresponsive  to 48 hours of medication and bedrest,,,,,,,,, plan MRI lumbar spine for further evaluation

## 2015-10-04 ENCOUNTER — Ambulatory Visit
Admission: RE | Admit: 2015-10-04 | Discharge: 2015-10-04 | Disposition: A | Discharge status: Home / Self Care | Payer: 59 | Source: Ambulatory Visit | Attending: Family Medicine | Admitting: Family Medicine

## 2015-10-04 ENCOUNTER — Other Ambulatory Visit: Payer: 59

## 2015-10-04 ENCOUNTER — Other Ambulatory Visit: Payer: Self-pay | Admitting: Family Medicine

## 2015-10-04 DIAGNOSIS — M519 Unspecified thoracic, thoracolumbar and lumbosacral intervertebral disc disorder: Secondary | ICD-10-CM

## 2015-10-04 DIAGNOSIS — M5441 Lumbago with sciatica, right side: Secondary | ICD-10-CM

## 2015-10-04 MED ORDER — METHYLPREDNISOLONE ACETATE 40 MG/ML INJ SUSP (RADIOLOG
120.0000 mg | Freq: Once | INTRAMUSCULAR | Status: AC
  Administered 2015-10-04: 120 mg via EPIDURAL

## 2015-10-04 MED ORDER — IOHEXOL 180 MG/ML  SOLN
1.0000 mL | Freq: Once | INTRAMUSCULAR | Status: AC | PRN
  Administered 2015-10-04: 1 mL via EPIDURAL

## 2015-10-04 NOTE — Discharge Instructions (Signed)

## 2015-10-04 NOTE — Telephone Encounter (Signed)
Order placed

## 2015-10-06 ENCOUNTER — Encounter: Payer: Self-pay | Admitting: Family Medicine

## 2015-10-06 ENCOUNTER — Ambulatory Visit (INDEPENDENT_AMBULATORY_CARE_PROVIDER_SITE_OTHER): Payer: 59 | Admitting: Family Medicine

## 2015-10-06 DIAGNOSIS — M5417 Radiculopathy, lumbosacral region: Secondary | ICD-10-CM

## 2015-10-06 DIAGNOSIS — Q688 Other specified congenital musculoskeletal deformities: Secondary | ICD-10-CM

## 2015-10-06 DIAGNOSIS — Q742 Other congenital malformations of lower limb(s), including pelvic girdle: Secondary | ICD-10-CM

## 2015-10-06 DIAGNOSIS — M5416 Radiculopathy, lumbar region: Secondary | ICD-10-CM | POA: Insufficient documentation

## 2015-10-06 MED ORDER — GABAPENTIN 100 MG PO CAPS: 200.0000 mg | ORAL_CAPSULE | Freq: Three times a day (TID) | ORAL | Status: DC

## 2015-10-06 MED ORDER — TIZANIDINE HCL 4 MG PO TABS: 4.0000 mg | ORAL_TABLET | Freq: Four times a day (QID) | ORAL | Status: DC | PRN

## 2015-10-06 NOTE — Patient Instructions (Signed)
Good to see you  Sorry dude! Zanaflex up to 3 times a day  Gabapentin 126m at night for 3 nights, 2018mnightly then for 3 nights then 30070mhereafter.  Have a "great" trip Touch base with me again and maybe epidural or talk to you SteBertram Millardr CraSaintclair Halsted

## 2015-10-06 NOTE — Assessment & Plan Note (Signed)
Has seen surgeon and will likely have it surgically repaired at some point but at this point we discussed that he probably should wait to see how his back does. Patient would not be able to do formal physical therapy with this back.

## 2015-10-06 NOTE — Progress Notes (Signed)
[name] [address] [address], [place] [address] Phone: [phone] Subjective:    CC:  Discuss recent back trouble and right ankle again.  [id number] Charles [name] is a 45 y.o. male coming in recurrent foot pain.        history of Crohn's disease. Patient is on vitamin D supplementation.  Patient does have right ankle pain. Patient has had this for quite some time. Has seen a surgeon recently and was going to have ankle surgery. States that it continues to give him some trouble. Has not been as active though secondary to recent back problems. Patient states 1 week ago he went down to that the dog and has a severe pain in his back and then radiation down his leg. Since then he has had numbness in his foot. Patient did see another provider who did order an MRI. MRI of the low back showed right L4-L5 central and rightward disc extrusion with free fragment compressing the right L5 nerve root with questionable hematoma. This was independently visualized by me. Patient then went and had an epidural and a nerve root block on February 15. States that there was mild improvement in pain but no improvement in the numbness. Denies any weakness the leg. Still is taking narcotics on a fairly regular basis. Rates the severity of discomfort is 6 out of 10. Patient though is frustrated on the severity of 9 out of 10.     Past Medical History  Diagnosis Date  . Incisional hernia   . Osteopenia   . Arthropathy     and scroilitis  . Thyroid disease   . 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 IBD (inflammatory bowel disease)   . Herniated disc   . Vitamin D deficiency   . Dysplastic nevi   . Migraine headache   . Crohn's ileocolitis ([facility]) 08/29/2008  . 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 History of proctitis 2006  . Morton's neuroma of right foot   . Small bowel obstruction due to adhesions ([facility]) 02/09/2015  . Allergy   . Asthma   . Arthritis   . Blood  transfusion without reported diagnosis   . GERD (gastroesophageal reflux disease)    Past Surgical History  Procedure Laterality Date  . Colectomy    . Ileoanal pull-through    . Functional endoscopic sinus surgery  12/2003    partial ethmoidectomy.  Dr Wilburn Cornelia  . Colonoscopy w/ biopsies  04/16/2006    crohn's colitis  . Flexible sigmoidoscopy  2006 - 02/2014    ileocolitis, pouchitis.  with pouch ulcer.   . Sacroilitis    . Sigmoidoscopy  01/2005  . Ventral hernia repair  06/2014   Social History   Social History  . Marital Status: Married    Spouse Name: N/A  . Number of Children: 2  . 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 Smoking status: Never Smoker   . Smokeless tobacco: Never Used  . Alcohol Use: 8.4 oz/week    14 Glasses of wine per week     Comment: couple of drinks daily  . Drug Use: No  . Sexual Activity: Yes   Other Topics Concern  . Not on file   Social History Narrative   Father a pulmonologist in [place].   Married to Dr. Honor [name] daughter.   Wife is CRNA   Allergies  Allergen Reactions  .  Mercaptopurine Nausea And Vomiting    Felt ill within 2 days of initiating therapy.   Family History  Problem Relation Age of Onset  . Colon cancer      grandmother  . Ulcerative colitis Mother      Past medical history, social, surgical and family history all reviewed in electronic medical record.   Review of Systems: No headache, visual changes, nausea, vomiting, diarrhea, constipation, dizziness, abdominal pain, skin rash, fevers, chills, night sweats, weight loss, swollen lymph nodes, body aches, joint swelling, muscle aches, chest pain, shortness of breath, mood changes.   Objective There were no vitals taken for this visit.  General: No apparent distress alert and oriented x3 mood and affect normal, dressed appropriately.  HEENT: Pupils equal, extraocular movements intact  Respiratory:  Patient's speak in full sentences and does not appear short of breath  Cardiovascular: No lower extremity edema, non tender, no erythema  Skin: Warm dry intact with no signs of infection or rash on extremities or on axial skeleton.  Abdomen: Soft nontender  Neuro: Cranial nerves II through XII are intact, neurovascularly intact in all extremities with 2+ DTRs and 2+ pulses.  Lymph: No lymphadenopathy of posterior or anterior cervical chain or axillae bilaterally.  Gait analysis mild antalgic gait MSK:  Non tender with full range of motion and good stability and symmetric strength and tone of shoulders, elbows,  hip, knee and bilaterally.   Back Exam:  Inspection: Unremarkable  Motion: Flexion 15 deg with radicular symptoms, Extension 25 deg, Side Bending to 25 deg bilaterally,  Rotation to 25 deg bilaterally  SLR laying: Severely positive right side XSLR laying: Negative  Palpable tenderness: Tender to palpation in the L4-L5 paraspinal muscle which are on the right side mild over the right sacroiliac joint FABER: Did not do secondary to pain Sensory change: Gross sensation intact to all lumbar and sacral dermatomes.  Reflexes: 2+ at both patellar tendons, 2+ at achilles tendons, Babinski's downgoing.  Strength at foot  Full strength and symmetric.   Ankle:right No visible erythema or swelling. Mild decrease in range of motion. This is change from previous exam. Lacks last 5 of dorsiflexion but does have full plantar flexion. Strength is 4/5 in all directions. Stable lateral and medial ligaments; squeeze test and kleiger test unremarkable; Talar dome minorly tender on the medial aspect No pain at base of 5th MT; No tenderness over cuboid; No tenderness over N spot or navicular prominence Mild tenderness over the posterior tibialis tendon. No sign of peroneal tendon subluxations or tenderness to palpation Negative tarsal tunnel tinel's Able to walk 4 steps. Contralateral ankle  unremarkable.  No significant change from previous exam      Impression and Recommendations:

## 2015-10-06 NOTE — Assessment & Plan Note (Signed)
Discussed with patient at great length. Patient is having chronic numbness in the foot but deep tendon reflexes as well as strength is intact. Pain seems to be fairly well controlled after the epidural but still cannot do more than daily activities. Discussed with patient at great length. At this time patient would like to wait until he can potentially have another epidural steroid injection which is 2 weeks after the previous one. We discussed possible neurosurgical evaluation and patient was given possibilities. Patient would like to wait. He is going out of town for the next 14 days and was to see if this possibly will improve. Patient knows red flags and went to seek medical attention. Given a muscle relaxer as well as a per square gabapentin to see if this will help with some of the symptoms. We discussed icing regimen. Given some home exercises to try to do daily for more range of motion. Discussed avoiding any significant lifting greater than 15 pounds. Patient will come back and see me again if anything else is necessary otherwise we will refer if any worsening symptoms to neurosurgery.

## 2015-10-10 ENCOUNTER — Encounter: Payer: Self-pay | Admitting: Family Medicine

## 2015-10-11 MED ORDER — HYDROCODONE-ACETAMINOPHEN 7.5-325 MG PO TABS: 1.0000 | ORAL_TABLET | Freq: Three times a day (TID) | ORAL | Status: DC | PRN

## 2015-10-11 NOTE — Telephone Encounter (Signed)
Printed medication for patient.

## 2015-10-12 ENCOUNTER — Other Ambulatory Visit: Payer: Self-pay | Admitting: Neurosurgery

## 2015-10-12 MED ORDER — MUPIROCIN 2 % EX OINT
1.0000 "application " | TOPICAL_OINTMENT | Freq: Once | CUTANEOUS | Status: AC
  Administered 2015-10-13: 1 via TOPICAL
  Filled 2015-10-12: qty 22

## 2015-10-12 MED ORDER — CEFAZOLIN SODIUM-DEXTROSE 2-3 GM-% IV SOLR
2.0000 g | INTRAVENOUS | Status: AC
  Administered 2015-10-13: 2 g via INTRAVENOUS
  Filled 2015-10-12: qty 50

## 2015-10-13 ENCOUNTER — Ambulatory Visit (HOSPITAL_COMMUNITY): Payer: 59 | Admitting: Anesthesiology

## 2015-10-13 ENCOUNTER — Encounter (HOSPITAL_COMMUNITY)
Admission: RE | Disposition: A | Discharge status: Home / Self Care | Payer: Self-pay | Source: Ambulatory Visit | Attending: Neurosurgery

## 2015-10-13 ENCOUNTER — Encounter (HOSPITAL_COMMUNITY): Payer: Self-pay | Admitting: *Deleted

## 2015-10-13 ENCOUNTER — Observation Stay (HOSPITAL_COMMUNITY)
Admission: RE | Admit: 2015-10-13 | Discharge: 2015-10-13 | Disposition: A | Discharge status: Home / Self Care | Payer: 59 | Source: Ambulatory Visit | Attending: Neurosurgery | Admitting: Neurosurgery

## 2015-10-13 ENCOUNTER — Ambulatory Visit (HOSPITAL_COMMUNITY): Payer: 59

## 2015-10-13 DIAGNOSIS — Z7951 Long term (current) use of inhaled steroids: Secondary | ICD-10-CM | POA: Diagnosis not present

## 2015-10-13 DIAGNOSIS — M4726 Other spondylosis with radiculopathy, lumbar region: Secondary | ICD-10-CM | POA: Diagnosis not present

## 2015-10-13 DIAGNOSIS — R030 Elevated blood-pressure reading, without diagnosis of hypertension: Secondary | ICD-10-CM | POA: Insufficient documentation

## 2015-10-13 DIAGNOSIS — K5090 Crohn's disease, unspecified, without complications: Secondary | ICD-10-CM | POA: Insufficient documentation

## 2015-10-13 DIAGNOSIS — J45909 Unspecified asthma, uncomplicated: Secondary | ICD-10-CM | POA: Insufficient documentation

## 2015-10-13 DIAGNOSIS — K219 Gastro-esophageal reflux disease without esophagitis: Secondary | ICD-10-CM | POA: Diagnosis not present

## 2015-10-13 DIAGNOSIS — Z79899 Other long term (current) drug therapy: Secondary | ICD-10-CM | POA: Diagnosis not present

## 2015-10-13 DIAGNOSIS — Z419 Encounter for procedure for purposes other than remedying health state, unspecified: Secondary | ICD-10-CM

## 2015-10-13 DIAGNOSIS — M5126 Other intervertebral disc displacement, lumbar region: Secondary | ICD-10-CM | POA: Diagnosis present

## 2015-10-13 DIAGNOSIS — M5116 Intervertebral disc disorders with radiculopathy, lumbar region: Principal | ICD-10-CM | POA: Insufficient documentation

## 2015-10-13 HISTORY — PX: LUMBAR LAMINECTOMY/DECOMPRESSION MICRODISCECTOMY: SHX5026

## 2015-10-13 LAB — CBC
HEMATOCRIT: 36.9 % — AB (ref 39.0–52.0)
HEMOGLOBIN: 12 g/dL — AB (ref 13.0–17.0)
MCH: 26 pg (ref 26.0–34.0)
MCHC: 32.5 g/dL (ref 30.0–36.0)
MCV: 80 fL (ref 78.0–100.0)
Platelets: 250 10*3/uL (ref 150–400)
RBC: 4.61 MIL/uL (ref 4.22–5.81)
RDW: 16.9 % — ABNORMAL HIGH (ref 11.5–15.5)
WBC: 8.4 10*3/uL (ref 4.0–10.5)

## 2015-10-13 LAB — BASIC METABOLIC PANEL
ANION GAP: 11 (ref 5–15)
BUN: 9 mg/dL (ref 6–20)
CHLORIDE: 102 mmol/L (ref 101–111)
CO2: 26 mmol/L (ref 22–32)
Calcium: 9.6 mg/dL (ref 8.9–10.3)
Creatinine, Ser: 0.93 mg/dL (ref 0.61–1.24)
GFR calc Af Amer: >60 mL/min (ref >60)
GLUCOSE: 93 mg/dL (ref 65–99)
POTASSIUM: 3.9 mmol/L (ref 3.5–5.1)
Sodium: 139 mmol/L (ref 135–145)

## 2015-10-13 LAB — SURGICAL PCR SCREEN
MRSA, PCR: POSITIVE — AB
STAPHYLOCOCCUS AUREUS: POSITIVE — AB

## 2015-10-13 SURGERY — LUMBAR LAMINECTOMY/DECOMPRESSION MICRODISCECTOMY 1 LEVEL
Anesthesia: General | Laterality: Right

## 2015-10-13 MED ORDER — SODIUM CHLORIDE 0.9 % IJ SOLN
INTRAMUSCULAR | Status: AC
  Filled 2015-10-13: qty 10

## 2015-10-13 MED ORDER — ACETAMINOPHEN 325 MG PO TABS: 650.0000 mg | ORAL_TABLET | ORAL | Status: DC | PRN

## 2015-10-13 MED ORDER — HYDROMORPHONE HCL 1 MG/ML IJ SOLN
0.5000 mg | INTRAMUSCULAR | Status: DC | PRN
  Administered 2015-10-13: 1 mg via INTRAVENOUS

## 2015-10-13 MED ORDER — CEFAZOLIN SODIUM 1-5 GM-% IV SOLN: 1.0000 g | Freq: Three times a day (TID) | INTRAVENOUS | Status: DC

## 2015-10-13 MED ORDER — SUGAMMADEX SODIUM 200 MG/2ML IV SOLN
INTRAVENOUS | Status: DC | PRN
  Administered 2015-10-13: 136 mg via INTRAVENOUS

## 2015-10-13 MED ORDER — GABAPENTIN 100 MG PO CAPS: 200.0000 mg | ORAL_CAPSULE | Freq: Three times a day (TID) | ORAL | Status: DC

## 2015-10-13 MED ORDER — ALUM & MAG HYDROXIDE-SIMETH 200-200-20 MG/5ML PO SUSP: 30.0000 mL | Freq: Four times a day (QID) | ORAL | Status: DC | PRN

## 2015-10-13 MED ORDER — SENNOSIDES-DOCUSATE SODIUM 8.6-50 MG PO TABS: 1.0000 | ORAL_TABLET | Freq: Every evening | ORAL | Status: DC | PRN

## 2015-10-13 MED ORDER — ALBUTEROL SULFATE (2.5 MG/3ML) 0.083% IN NEBU: 2.5000 mg | INHALATION_SOLUTION | Freq: Four times a day (QID) | RESPIRATORY_TRACT | Status: DC | PRN

## 2015-10-13 MED ORDER — HYDROMORPHONE HCL 1 MG/ML IJ SOLN
INTRAMUSCULAR | Status: AC
  Filled 2015-10-13: qty 1

## 2015-10-13 MED ORDER — CELECOXIB 100 MG PO CAPS
100.0000 mg | ORAL_CAPSULE | Freq: Two times a day (BID) | ORAL | Status: DC | PRN
  Filled 2015-10-13: qty 1

## 2015-10-13 MED ORDER — SODIUM CHLORIDE 0.9% FLUSH
3.0000 mL | Freq: Two times a day (BID) | INTRAVENOUS | Status: DC
Start: 2015-10-13 — End: 2015-10-13

## 2015-10-13 MED ORDER — OXYCODONE-ACETAMINOPHEN 5-325 MG PO TABS
1.0000 | ORAL_TABLET | ORAL | Status: DC | PRN
  Administered 2015-10-13: 2 via ORAL
  Filled 2015-10-13: qty 2

## 2015-10-13 MED ORDER — ROCURONIUM BROMIDE 50 MG/5ML IV SOLN
INTRAVENOUS | Status: AC
Start: 2015-10-13 — End: 2015-10-13
  Filled 2015-10-13: qty 2

## 2015-10-13 MED ORDER — SODIUM CHLORIDE 0.9% FLUSH: 3.0000 mL | INTRAVENOUS | Status: DC | PRN

## 2015-10-13 MED ORDER — HEMOSTATIC AGENTS (NO CHARGE) OPTIME
TOPICAL | Status: DC | PRN
  Administered 2015-10-13: 1 via TOPICAL

## 2015-10-13 MED ORDER — FENTANYL CITRATE (PF) 100 MCG/2ML IJ SOLN
INTRAMUSCULAR | Status: AC
  Administered 2015-10-13: 100 ug
  Filled 2015-10-13: qty 2

## 2015-10-13 MED ORDER — ARTIFICIAL TEARS OP OINT
TOPICAL_OINTMENT | OPHTHALMIC | Status: AC
  Filled 2015-10-13: qty 7

## 2015-10-13 MED ORDER — PANTOPRAZOLE SODIUM 40 MG IV SOLR: 40.0000 mg | Freq: Every day | INTRAVENOUS | Status: DC

## 2015-10-13 MED ORDER — PROPOFOL 10 MG/ML IV BOLUS
INTRAVENOUS | Status: DC | PRN
  Administered 2015-10-13: 120 mg via INTRAVENOUS

## 2015-10-13 MED ORDER — DEXAMETHASONE SODIUM PHOSPHATE 4 MG/ML IJ SOLN
INTRAMUSCULAR | Status: AC
  Filled 2015-10-13: qty 1

## 2015-10-13 MED ORDER — LIDOCAINE-EPINEPHRINE 1 %-1:100000 IJ SOLN
INTRAMUSCULAR | Status: DC | PRN
  Administered 2015-10-13: 10 mL

## 2015-10-13 MED ORDER — ONDANSETRON HCL 4 MG/2ML IJ SOLN
INTRAMUSCULAR | Status: AC
  Filled 2015-10-13: qty 2

## 2015-10-13 MED ORDER — EPHEDRINE SULFATE 50 MG/ML IJ SOLN
INTRAMUSCULAR | Status: AC
  Filled 2015-10-13: qty 1

## 2015-10-13 MED ORDER — LIDOCAINE HCL (CARDIAC) 20 MG/ML IV SOLN
INTRAVENOUS | Status: DC | PRN
  Administered 2015-10-13: 40 mg via INTRAVENOUS

## 2015-10-13 MED ORDER — MOMETASONE FURO-FORMOTEROL FUM 100-5 MCG/ACT IN AERO
2.0000 | INHALATION_SPRAY | Freq: Every day | RESPIRATORY_TRACT | Status: DC
  Filled 2015-10-13: qty 8.8

## 2015-10-13 MED ORDER — ACETAMINOPHEN 650 MG RE SUPP: 650.0000 mg | RECTAL | Status: DC | PRN

## 2015-10-13 MED ORDER — FENTANYL CITRATE (PF) 250 MCG/5ML IJ SOLN
INTRAMUSCULAR | Status: AC
  Filled 2015-10-13: qty 5

## 2015-10-13 MED ORDER — DEXAMETHASONE SODIUM PHOSPHATE 4 MG/ML IJ SOLN
INTRAMUSCULAR | Status: DC | PRN
  Administered 2015-10-13: 4 mg via INTRAVENOUS

## 2015-10-13 MED ORDER — VANCOMYCIN HCL IN DEXTROSE 1-5 GM/200ML-% IV SOLN
INTRAVENOUS | Status: AC
  Administered 2015-10-13: 1000 mg via INTRAVENOUS
  Filled 2015-10-13: qty 200

## 2015-10-13 MED ORDER — SODIUM CHLORIDE 0.9 % IV SOLN: 250.0000 mL | INTRAVENOUS | Status: DC

## 2015-10-13 MED ORDER — PROPOFOL 10 MG/ML IV BOLUS
INTRAVENOUS | Status: AC
  Filled 2015-10-13: qty 20

## 2015-10-13 MED ORDER — 0.9 % SODIUM CHLORIDE (POUR BTL) OPTIME
TOPICAL | Status: DC | PRN
  Administered 2015-10-13: 1000 mL

## 2015-10-13 MED ORDER — DOCUSATE SODIUM 100 MG PO CAPS: 100.0000 mg | ORAL_CAPSULE | Freq: Two times a day (BID) | ORAL | Status: DC

## 2015-10-13 MED ORDER — DEXAMETHASONE SODIUM PHOSPHATE 10 MG/ML IJ SOLN
INTRAMUSCULAR | Status: AC
  Filled 2015-10-13: qty 1

## 2015-10-13 MED ORDER — RISAQUAD PO CAPS: 1.0000 | ORAL_CAPSULE | Freq: Every day | ORAL | Status: DC

## 2015-10-13 MED ORDER — VSL#3 DS PO PACK: 1.0000 | PACK | Freq: Every day | ORAL | Status: DC

## 2015-10-13 MED ORDER — MENTHOL 3 MG MT LOZG: 1.0000 | LOZENGE | OROMUCOSAL | Status: DC | PRN

## 2015-10-13 MED ORDER — ONDANSETRON HCL 4 MG/2ML IJ SOLN
INTRAMUSCULAR | Status: DC | PRN
  Administered 2015-10-13: 4 mg via INTRAVENOUS

## 2015-10-13 MED ORDER — LACTATED RINGERS IV SOLN
INTRAVENOUS | Status: DC
  Administered 2015-10-13: 12:00:00 via INTRAVENOUS

## 2015-10-13 MED ORDER — PANTOPRAZOLE SODIUM 40 MG PO TBEC: 40.0000 mg | DELAYED_RELEASE_TABLET | Freq: Every day | ORAL | Status: DC

## 2015-10-13 MED ORDER — HYDROCODONE-ACETAMINOPHEN 5-325 MG PO TABS: 1.0000 | ORAL_TABLET | ORAL | Status: DC | PRN

## 2015-10-13 MED ORDER — MIDAZOLAM HCL 2 MG/2ML IJ SOLN
INTRAMUSCULAR | Status: AC
  Filled 2015-10-13: qty 2

## 2015-10-13 MED ORDER — BUPIVACAINE HCL (PF) 0.5 % IJ SOLN
INTRAMUSCULAR | Status: DC | PRN
  Administered 2015-10-13: 10 mL

## 2015-10-13 MED ORDER — LIDOCAINE HCL 4 % EX SOLN
CUTANEOUS | Status: DC | PRN
  Administered 2015-10-13: 3 mL via TOPICAL

## 2015-10-13 MED ORDER — BISACODYL 10 MG RE SUPP: 10.0000 mg | Freq: Every day | RECTAL | Status: DC | PRN

## 2015-10-13 MED ORDER — METHYLPREDNISOLONE ACETATE 80 MG/ML IJ SUSP
INTRAMUSCULAR | Status: DC | PRN
  Administered 2015-10-13: 80 mg

## 2015-10-13 MED ORDER — THROMBIN 5000 UNITS EX SOLR
CUTANEOUS | Status: DC | PRN
  Administered 2015-10-13 (×2): 5000 [IU] via TOPICAL

## 2015-10-13 MED ORDER — ARTIFICIAL TEARS OP OINT
TOPICAL_OINTMENT | OPHTHALMIC | Status: AC
Start: 2015-10-13 — End: 2015-10-13
  Filled 2015-10-13: qty 3.5

## 2015-10-13 MED ORDER — ROCURONIUM BROMIDE 100 MG/10ML IV SOLN
INTRAVENOUS | Status: DC | PRN
  Administered 2015-10-13: 50 mg via INTRAVENOUS

## 2015-10-13 MED ORDER — FENTANYL CITRATE (PF) 100 MCG/2ML IJ SOLN
INTRAMUSCULAR | Status: DC | PRN
  Administered 2015-10-13 (×2): 50 ug via INTRAVENOUS
  Administered 2015-10-13: 150 ug via INTRAVENOUS

## 2015-10-13 MED ORDER — KCL IN DEXTROSE-NACL 20-5-0.45 MEQ/L-%-% IV SOLN: INTRAVENOUS | Status: DC

## 2015-10-13 MED ORDER — SUGAMMADEX SODIUM 200 MG/2ML IV SOLN
INTRAVENOUS | Status: AC
Start: 2015-10-13 — End: 2015-10-13
  Filled 2015-10-13: qty 2

## 2015-10-13 MED ORDER — FENTANYL CITRATE (PF) 100 MCG/2ML IJ SOLN
INTRAMUSCULAR | Status: DC | PRN
  Administered 2015-10-13: 25 ug via INTRAVENOUS

## 2015-10-13 MED ORDER — ALBUTEROL SULFATE HFA 108 (90 BASE) MCG/ACT IN AERS: 2.0000 | INHALATION_SPRAY | Freq: Four times a day (QID) | RESPIRATORY_TRACT | Status: DC | PRN

## 2015-10-13 MED ORDER — HYDROCODONE-ACETAMINOPHEN 7.5-325 MG PO TABS: 1.0000 | ORAL_TABLET | Freq: Three times a day (TID) | ORAL | Status: DC | PRN

## 2015-10-13 MED ORDER — ARTIFICIAL TEARS OP OINT
TOPICAL_OINTMENT | OPHTHALMIC | Status: DC | PRN
  Administered 2015-10-13: 1 via OPHTHALMIC

## 2015-10-13 MED ORDER — PHENOL 1.4 % MT LIQD: 1.0000 | OROMUCOSAL | Status: DC | PRN

## 2015-10-13 MED ORDER — VITAMIN D 1000 UNITS PO TABS: 1000.0000 [IU] | ORAL_TABLET | Freq: Every day | ORAL | Status: DC

## 2015-10-13 MED ORDER — PROPOFOL 10 MG/ML IV BOLUS
INTRAVENOUS | Status: AC
Start: 2015-10-13 — End: 2015-10-13
  Filled 2015-10-13: qty 20

## 2015-10-13 MED ORDER — FENTANYL CITRATE (PF) 100 MCG/2ML IJ SOLN
INTRAMUSCULAR | Status: AC
  Filled 2015-10-13: qty 2

## 2015-10-13 MED ORDER — LACTATED RINGERS IV SOLN
INTRAVENOUS | Status: DC | PRN
  Administered 2015-10-13 (×2): via INTRAVENOUS

## 2015-10-13 MED ORDER — LIDOCAINE HCL (CARDIAC) 20 MG/ML IV SOLN
INTRAVENOUS | Status: AC
  Filled 2015-10-13: qty 10

## 2015-10-13 MED ORDER — ONDANSETRON HCL 4 MG/2ML IJ SOLN: 4.0000 mg | INTRAMUSCULAR | Status: DC | PRN

## 2015-10-13 MED ORDER — METHOCARBAMOL 1000 MG/10ML IJ SOLN
500.0000 mg | Freq: Four times a day (QID) | INTRAVENOUS | Status: DC | PRN
  Filled 2015-10-13: qty 5

## 2015-10-13 MED ORDER — LORAZEPAM 0.5 MG PO TABS: 1.0000 mg | ORAL_TABLET | Freq: Every evening | ORAL | Status: DC | PRN

## 2015-10-13 MED ORDER — MIDAZOLAM HCL 2 MG/2ML IJ SOLN
INTRAMUSCULAR | Status: AC
  Administered 2015-10-13: 2 mg
  Filled 2015-10-13: qty 2

## 2015-10-13 MED ORDER — ZOLPIDEM TARTRATE 5 MG PO TABS: 5.0000 mg | ORAL_TABLET | Freq: Every evening | ORAL | Status: DC | PRN

## 2015-10-13 MED ORDER — TIZANIDINE HCL 4 MG PO TABS
4.0000 mg | ORAL_TABLET | Freq: Four times a day (QID) | ORAL | Status: DC | PRN
  Filled 2015-10-13: qty 1

## 2015-10-13 MED ORDER — METHOCARBAMOL 500 MG PO TABS: 500.0000 mg | ORAL_TABLET | Freq: Four times a day (QID) | ORAL | Status: DC | PRN

## 2015-10-13 SURGICAL SUPPLY — 52 items
BENZOIN TINCTURE PRP APPL 2/3 (GAUZE/BANDAGES/DRESSINGS) IMPLANT
BIT DRILL NEURO 2X3.1 SFT TUCH (MISCELLANEOUS) ×1 IMPLANT
BLADE CLIPPER SURG (BLADE) IMPLANT
BUR ROUND FLUTED 5 RND (BURR) ×2 IMPLANT
CANISTER SUCT 3000ML PPV (MISCELLANEOUS) ×2 IMPLANT
DECANTER SPIKE VIAL GLASS SM (MISCELLANEOUS) ×2 IMPLANT
DERMABOND ADVANCED (GAUZE/BANDAGES/DRESSINGS) ×1
DERMABOND ADVANCED .7 DNX12 (GAUZE/BANDAGES/DRESSINGS) ×1 IMPLANT
DRAPE LAPAROTOMY 100X72X124 (DRAPES) ×2 IMPLANT
DRAPE MICROSCOPE LEICA (MISCELLANEOUS) ×2 IMPLANT
DRAPE POUCH INSTRU U-SHP 10X18 (DRAPES) ×2 IMPLANT
DRAPE SURG 17X23 STRL (DRAPES) ×2 IMPLANT
DRILL NEURO 2X3.1 SOFT TOUCH (MISCELLANEOUS) ×2
DRSG OPSITE POSTOP 3X4 (GAUZE/BANDAGES/DRESSINGS) ×2 IMPLANT
DURAPREP 26ML APPLICATOR (WOUND CARE) ×2 IMPLANT
ELECT REM PT RETURN 9FT ADLT (ELECTROSURGICAL) ×2
ELECTRODE REM PT RTRN 9FT ADLT (ELECTROSURGICAL) ×1 IMPLANT
GAUZE SPONGE 4X4 12PLY STRL (GAUZE/BANDAGES/DRESSINGS) IMPLANT
GAUZE SPONGE 4X4 16PLY XRAY LF (GAUZE/BANDAGES/DRESSINGS) IMPLANT
GLOVE BIO SURGEON STRL SZ8 (GLOVE) ×2 IMPLANT
GLOVE BIOGEL PI IND STRL 8 (GLOVE) ×1 IMPLANT
GLOVE BIOGEL PI IND STRL 8.5 (GLOVE) ×1 IMPLANT
GLOVE BIOGEL PI INDICATOR 8 (GLOVE) ×1
GLOVE BIOGEL PI INDICATOR 8.5 (GLOVE) ×1
GLOVE ECLIPSE 8.0 STRL XLNG CF (GLOVE) ×2 IMPLANT
GLOVE EXAM NITRILE LRG STRL (GLOVE) IMPLANT
GLOVE EXAM NITRILE MD LF STRL (GLOVE) IMPLANT
GLOVE EXAM NITRILE XL STR (GLOVE) IMPLANT
GLOVE EXAM NITRILE XS STR PU (GLOVE) IMPLANT
GOWN STRL REUS W/ TWL LRG LVL3 (GOWN DISPOSABLE) IMPLANT
GOWN STRL REUS W/ TWL XL LVL3 (GOWN DISPOSABLE) ×1 IMPLANT
GOWN STRL REUS W/TWL 2XL LVL3 (GOWN DISPOSABLE) ×2 IMPLANT
GOWN STRL REUS W/TWL LRG LVL3 (GOWN DISPOSABLE)
GOWN STRL REUS W/TWL XL LVL3 (GOWN DISPOSABLE) ×1
KIT BASIN OR (CUSTOM PROCEDURE TRAY) ×2 IMPLANT
KIT ROOM TURNOVER OR (KITS) ×2 IMPLANT
NEEDLE HYPO 18GX1.5 BLUNT FILL (NEEDLE) ×4 IMPLANT
NEEDLE HYPO 25X1 1.5 SAFETY (NEEDLE) ×2 IMPLANT
NS IRRIG 1000ML POUR BTL (IV SOLUTION) ×2 IMPLANT
PACK LAMINECTOMY NEURO (CUSTOM PROCEDURE TRAY) ×2 IMPLANT
PAD ARMBOARD 7.5X6 YLW CONV (MISCELLANEOUS) ×6 IMPLANT
RUBBERBAND STERILE (MISCELLANEOUS) ×4 IMPLANT
SPONGE SURGIFOAM ABS GEL SZ50 (HEMOSTASIS) ×2 IMPLANT
STRIP CLOSURE SKIN 1/2X4 (GAUZE/BANDAGES/DRESSINGS) IMPLANT
SUT VIC AB 0 CT1 18XCR BRD8 (SUTURE) ×1 IMPLANT
SUT VIC AB 0 CT1 8-18 (SUTURE) ×1
SUT VIC AB 2-0 CT1 18 (SUTURE) ×2 IMPLANT
SUT VIC AB 3-0 SH 8-18 (SUTURE) ×2 IMPLANT
SYR 5ML LL (SYRINGE) ×4 IMPLANT
TOWEL OR 17X24 6PK STRL BLUE (TOWEL DISPOSABLE) ×2 IMPLANT
TOWEL OR 17X26 10 PK STRL BLUE (TOWEL DISPOSABLE) ×2 IMPLANT
WATER STERILE IRR 1000ML POUR (IV SOLUTION) ×2 IMPLANT

## 2015-10-13 NOTE — Progress Notes (Signed)
Pt doing well. Pt given D/C instructions with Rx's, verbal understanding was provided. Pt's incision is clean and dry with no sign of infection. Pt's IV was removed prior to D/C. Pt D/C'd home via wheelchair @ 1820 per MD order. Pt is stable @ D/C and has no other needs at this time. Holli Humbles, RN

## 2015-10-13 NOTE — Progress Notes (Signed)
Awake, alert, conversant.  Strength better in right leg.  Numbness much improved.  Doing well.

## 2015-10-13 NOTE — Anesthesia Preprocedure Evaluation (Addendum)
Anesthesia Evaluation  Patient identified by MRN, date of birth, ID band Patient awake    Reviewed: Allergy & Precautions, NPO status , Patient's Chart, lab work & pertinent test results  Airway Mallampati: I  TM Distance: >3 FB Neck ROM: Full    Dental  (+) Teeth Intact, Dental Advisory Given   Pulmonary    breath sounds clear to auscultation       Cardiovascular  Rhythm:Regular Rate:Normal     Neuro/Psych    GI/Hepatic   Endo/Other    Renal/GU      Musculoskeletal   Abdominal   Peds  Hematology   Anesthesia Other Findings   Reproductive/Obstetrics                            Anesthesia Physical Anesthesia Plan  ASA: III  Anesthesia Plan: General   Post-op Pain Management:    Induction: Intravenous  Airway Management Planned: Oral ETT  Additional Equipment:   Intra-op Plan:   Post-operative Plan: Extubation in OR  Informed Consent: I have reviewed the patients History and Physical, chart, labs and discussed the procedure including the risks, benefits and alternatives for the proposed anesthesia with the patient or authorized representative who has indicated his/her understanding and acceptance.   Dental advisory given  Plan Discussed with: CRNA and Anesthesiologist  Anesthesia Plan Comments:         Anesthesia Quick Evaluation

## 2015-10-13 NOTE — Discharge Summary (Signed)
Physician Discharge Summary  Patient ID: Charles [name] MRN: [id number] DOB/AGE: 1971/05/07 45 y.o.  Admit date: 10/13/2015 Discharge date: 10/13/2015  Admission Diagnoses:Herniated lumbar disc L 45 Right with severe right L 5 radiculopathy  Discharge Diagnoses: Herniated lumbar disc L 45 Right with severe right L 5 radiculopathy   Active Problems:   Herniated lumbar disc without myelopathy   Discharged Condition: good  Hospital Course: Patient underwent Right L 45 microdiscectomy.  He had significant reduction in pain and improvement in motor function.  Consults: None  Significant Diagnostic Studies: None  Treatments: surgery: Right L 45 microdiscectomy  Discharge Exam: Blood pressure 151/97, pulse 68, temperature 98.1 F (36.7 C), temperature source Oral, resp. rate 12, height 5' 10"  (1.778 m), weight 68.04 kg (150 lb), SpO2 97 %. Neurologic: Alert and oriented X 3, normal strength and tone. Normal symmetric reflexes. Normal coordination and gait Wound:  Disposition: Home     Medication List    TAKE these medications        albuterol 108 (90 Base) MCG/ACT inhaler  Commonly known as:  PROVENTIL HFA;VENTOLIN HFA  Inhale 2 puffs into the lungs every 6 (six) hours as needed.     celecoxib 100 MG capsule  Commonly known as:  CELEBREX  Take 1 capsule (100 mg total) by mouth 2 (two) times daily as needed.     cholecalciferol 1000 units tablet  Commonly known as:  VITAMIN D  Take 1,000 Units by mouth daily.     DEXILANT 60 MG capsule  Generic drug:  dexlansoprazole  Take 60 mg by mouth daily.     gabapentin 100 MG capsule  Commonly known as:  NEURONTIN  Take 2 capsules (200 mg total) by mouth 3 (three) times daily.     HUMIRA PEN 40 MG/0.8ML Pnkt  Generic drug:  Adalimumab  INJECT 40 MG SUBCUTANEOUSLY EVERY 2 WEEKS     HYDROcodone-acetaminophen 7.5-325 MG tablet  Commonly known as:  NORCO  Take 1 tablet by mouth every 8 (eight) hours as needed for moderate pain  (cough).     LORazepam 1 MG tablet  Commonly known as:  ATIVAN  Take 1 tablet (1 mg total) by mouth at bedtime as needed.     mesalamine 4 g enema  Commonly known as:  ROWASA  Place 1 kit (4 g total) rectally at bedtime.     mometasone-formoterol 100-5 MCG/ACT Aero  Commonly known as:  DULERA  Inhale 2 puffs into the lungs daily. Inhale 2 puffs into the lungs 2 times daily     tiZANidine 4 MG tablet  Commonly known as:  ZANAFLEX  Take 1 tablet (4 mg total) by mouth every 6 (six) hours as needed for muscle spasms.     Vitamin D (Ergocalciferol) 50000 units Caps capsule  Commonly known as:  DRISDOL  TAKE ONE CAPSULE BY MOUTH EVERY 7 DAYS FOR EIGHT doses     VSL#3 DS Pack  Take 1 each by mouth daily.         Signed: Peggyann Shoals, MD 10/13/2015, 4:51 PM

## 2015-10-13 NOTE — Brief Op Note (Signed)
10/13/2015  3:51 PM  PATIENT:  Charles [name]  45 y.o. 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, degenerative disc disease, radiculopathy  PROCEDURE:  Procedure(s) with comments: Right Lumbar four- five Microdiskectomy (Right) - Right L4-5 Microdiskectomy with microdissection  SURGEON:  Surgeon(s) and Role:    * Erline Levine, MD - Primary    * Kevan Ny Ditty, MD - Assisting  PHYSICIAN ASSISTANT:   ASSISTANTS: Poteat, RN   ANESTHESIA:   general  EBL:  Total I/O In: 1000 [I.V.:1000] Out: -   BLOOD ADMINISTERED:none  DRAINS: none   LOCAL MEDICATIONS USED:  LIDOCAINE   SPECIMEN:  No Specimen  DISPOSITION OF SPECIMEN:  N/A  COUNTS:  YES  TOURNIQUET:  * No tourniquets in log *  DICTATION: Patient has a large L4-5 disc rupture on the right with significant right leg weakness. It was elected to take him to surgery for right L4-5 microdiscectomy.  Procedure: Patient was brought to the operating room and following the smooth and uncomplicated induction of general endotracheal anesthesia he was placed in a prone position on the Wilson frame. Low back was prepped and draped in the usual sterile fashion with betadine scrub and DuraPrep. Area of planned incision was infiltrated with local lidocaine. Incision was made in the midline and carried to the lumbodorsal fascia which was incised on the right side of midline. Subperiosteal dissection was performed exposing what was felt to be L45 level. Intraoperative x-ray demonstrated marker probes at L4-5.  A hemi-semi-laminectomy of L4 was performed a high-speed drill and completed with Kerrison rongeurs and a generous foraminotomy was performed overlying the superior aspect of the L5 lamina. Ligamentum flavum was detached and removed in a piecemeal fashion although it appeared  fused to the dura, so I elected to leave a thin layer of ligament attached and the L5 nerve root was decompressed laterally with removal of the superior aspect of the facet and ligamentum causing nerve root compression. The microscope was brought into the field and the L5 nerve root was mobilized medially. This exposed a large amount of soft disc material and a free fragment of herniated disc material. Multiple fragments were removed and these did not extend into the interspace.  As a result it was elected not to further decompress the interspace. Multiple ball hooks were repeatedly passed beneath the thecal sac and nerve root with removal of multiple fragments of disc material.   At this point it was felt that all neural elements were well decompressed and there was no evidence of residual loose disc material within the interspace. The interspace was then irrigated with saline and no additional disc material was mobilized. Hemostasis was assured with bipolar electrocautery and the interspace was irrigated with Depo-Medrol and fentanyl. The lumbodorsal fascia was closed with 0 Vicryl sutures the subcutaneous tissues reapproximated 2-0 Vicryl inverted sutures and the skin edges were reapproximated with 3-0 Vicryl subcuticular stitch. The wound is dressed with Dermabond and an occlusive dressing. Patient was extubated in the operating room and taken to recovery in stable and satisfactory condition having tolerated his operation well counts were correct at the end of the case.  PLAN OF CARE: Admit for overnight observation  PATIENT DISPOSITION:  PACU - hemodynamically stable.   Delay start of Pharmacological VTE agent (>24hrs) due to surgical blood loss or risk of bleeding: yes

## 2015-10-13 NOTE — Anesthesia Postprocedure Evaluation (Signed)
Anesthesia Post Note  Patient: Charles [name]  Procedure(s) Performed: Procedure(s) (LRB): Right Lumbar four- five Microdiskectomy (Right)  Patient location during evaluation: PACU Anesthesia Type: General Level of consciousness: awake and alert Pain management: pain level controlled Vital Signs Assessment: post-procedure vital signs reviewed and stable Respiratory status: spontaneous breathing, nonlabored ventilation and respiratory function stable Cardiovascular status: blood pressure returned to baseline and stable Postop Assessment: no signs of nausea or vomiting Anesthetic complications: no    Last Vitals:  Filed Vitals:   10/13/15 1607 10/13/15 1615  BP: 151/97   Pulse: 65 68  Temp:  36.7 C  Resp: 14 12    Last Pain: There were no vitals filed for this visit.               Sony Schlarb,W. EDMOND

## 2015-10-13 NOTE — Interval H&P Note (Signed)
History and Physical Interval Note:  10/13/2015 6:33 AM  Charles [name]  has presented today for surgery, with the diagnosis of Lumbar herniated disc  The various methods of treatment have been discussed with the patient and family. After consideration of risks, benefits and other options for treatment, the patient has consented to  Procedure(s) with comments: Right L4-5 Microdiskectomy (Right) - Right L4-5 Microdiskectomy as a surgical intervention .  The patient's history has been reviewed, patient examined, no change in status, stable for surgery.  I have reviewed the patient's chart and labs.  Questions were answered to the patient's satisfaction.     Antonious Omahoney D

## 2015-10-13 NOTE — Anesthesia Postprocedure Evaluation (Signed)
Anesthesia Post Note  Patient: Charles [name]  Procedure(s) Performed: Procedure(s) (LRB): Right Lumbar four- five Microdiskectomy (Right)  Patient location during evaluation: PACU Anesthesia Type: General Level of consciousness: awake and awake and alert Pain management: pain level controlled Vital Signs Assessment: post-procedure vital signs reviewed and stable Respiratory status: spontaneous breathing and nonlabored ventilation Cardiovascular status: blood pressure returned to baseline Anesthetic complications: no    Last Vitals:  Filed Vitals:   10/13/15 1615 10/13/15 1701  BP:  152/96  Pulse: 68 52  Temp: 36.7 C 36.8 C  Resp: 12 18    Last Pain:  Filed Vitals:   10/13/15 1848  PainSc: 5                  Daisey Caloca COKER

## 2015-10-13 NOTE — Transfer of Care (Signed)
Immediate Anesthesia Transfer of Care Note  Patient: Charles [name]  Procedure(s) Performed: Procedure(s) with comments: Right Lumbar four- five Microdiskectomy (Right) - Right L4-5 Microdiskectomy  Patient Location: PACU  Anesthesia Type:General  Level of Consciousness: awake, alert  and oriented  Airway & Oxygen Therapy: Patient Spontanous Breathing and Patient connected to nasal cannula oxygen  Post-op Assessment: Report given to RN, Post -op Vital signs reviewed and stable and Patient moving all extremities  Post vital signs: Reviewed and stable  Last Vitals:  Filed Vitals:   10/13/15 1230  BP: 141/86  Pulse: 64  Temp: 36.9 C  Resp: 18    Complications: No apparent anesthesia complications

## 2015-10-13 NOTE — Anesthesia Procedure Notes (Signed)
Procedure Name: Intubation Date/Time: 10/13/2015 2:17 PM Performed by: Suzy Bouchard Pre-anesthesia Checklist: Patient identified, Timeout performed, Emergency Drugs available, Patient being monitored and Suction available Patient Re-evaluated:Patient Re-evaluated prior to inductionOxygen Delivery Method: Circle system utilized Preoxygenation: Pre-oxygenation with 100% oxygen Intubation Type: IV induction Ventilation: Mask ventilation without difficulty Laryngoscope Size: Miller and 2 Grade View: Grade I Tube size: 7.5 mm Number of attempts: 1 Airway Equipment and Method: Stylet and LTA kit utilized Placement Confirmation: ETT inserted through vocal cords under direct vision,  breath sounds checked- equal and bilateral and positive ETCO2 Secured at: 22 cm Tube secured with: Tape Dental Injury: Teeth and Oropharynx as per pre-operative assessment

## 2015-10-13 NOTE — Op Note (Signed)
10/13/2015  3:51 PM  PATIENT:  Charles [name]  45 y.o. 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, degenerative disc disease, radiculopathy  PROCEDURE:  Procedure(s) with comments: Right Lumbar four- five Microdiskectomy (Right) - Right L4-5 Microdiskectomy with microdissection  SURGEON:  Surgeon(s) and Role:    * Erline Levine, MD - Primary    * Kevan Ny Ditty, MD - Assisting  PHYSICIAN ASSISTANT:   ASSISTANTS: Poteat, RN   ANESTHESIA:   general  EBL:  Total I/O In: 1000 [I.V.:1000] Out: -   BLOOD ADMINISTERED:none  DRAINS: none   LOCAL MEDICATIONS USED:  LIDOCAINE   SPECIMEN:  No Specimen  DISPOSITION OF SPECIMEN:  N/A  COUNTS:  YES  TOURNIQUET:  * No tourniquets in log *  DICTATION: Patient has a large L4-5 disc rupture on the right with significant right leg weakness. It was elected to take him to surgery for right L4-5 microdiscectomy.  Procedure: Patient was brought to the operating room and following the smooth and uncomplicated induction of general endotracheal anesthesia he was placed in a prone position on the Wilson frame. Low back was prepped and draped in the usual sterile fashion with betadine scrub and DuraPrep. Area of planned incision was infiltrated with local lidocaine. Incision was made in the midline and carried to the lumbodorsal fascia which was incised on the right side of midline. Subperiosteal dissection was performed exposing what was felt to be L45 level. Intraoperative x-ray demonstrated marker probes at L4-5.  A hemi-semi-laminectomy of L4 was performed a high-speed drill and completed with Kerrison rongeurs and a generous foraminotomy was performed overlying the superior aspect of the L5 lamina. Ligamentum flavum was detached and removed in a piecemeal fashion although it appeared  fused to the dura, so I elected to leave a thin layer of ligament attached and the L5 nerve root was decompressed laterally with removal of the superior aspect of the facet and ligamentum causing nerve root compression. The microscope was brought into the field and the L5 nerve root was mobilized medially. This exposed a large amount of soft disc material and a free fragment of herniated disc material. Multiple fragments were removed and these did not extend into the interspace.  As a result it was elected not to further decompress the interspace. Multiple ball hooks were repeatedly passed beneath the thecal sac and nerve root with removal of multiple fragments of disc material.   At this point it was felt that all neural elements were well decompressed and there was no evidence of residual loose disc material within the interspace. The interspace was then irrigated with saline and no additional disc material was mobilized. Hemostasis was assured with bipolar electrocautery and the interspace was irrigated with Depo-Medrol and fentanyl. The lumbodorsal fascia was closed with 0 Vicryl sutures the subcutaneous tissues reapproximated 2-0 Vicryl inverted sutures and the skin edges were reapproximated with 3-0 Vicryl subcuticular stitch. The wound is dressed with Dermabond and an occlusive dressing. Patient was extubated in the operating room and taken to recovery in stable and satisfactory condition having tolerated his operation well counts were correct at the end of the case.  PLAN OF CARE: Admit for overnight observation  PATIENT DISPOSITION:  PACU - hemodynamically stable.   Delay start of Pharmacological VTE agent (>24hrs) due to surgical blood loss or risk of bleeding: yes

## 2015-10-13 NOTE — H&P (Signed)
Patient ID:   [id number] Patient: Charles [name]  Date of Birth: 29-Sep-1970 Visit Type: Office Visit   Date: 10/11/2015 03:00 PM Provider: Marchia Meiers. Vertell Limber MD   This 45 year old male presents for back pain.  History of Present Illness: 1.  back pain  Charles [name], 45 year old male employed with [employer], visits for evaluation of right lower extremity pain, right foot numbness.  Patient recalls bending over to pick up his dog's bowl on February 10.  Prior to that he noted some lumbar pain while trail running.   [place]co as needed  History: Crohn's disease, asthma, GERD Surgical history: Sinus surgery 2005, colectomy 2004  Humira is taken for Crohn's  MRI on [place]  The patient's MRI demonstrates a large disc herniation at L4 L5 on the right with a large caudally migrated free fragment of herniated disc material.  There is significant right L5 nerve root compression.  He has disc degeneration at L3 L4, L4-L5, L5-S1 levels.  Despite these degenerative changes there does not appear to be nerve root compression as a result of these additional degenerative features.  The patient has significant right leg pain and weakness.  He says that this is been going on for 12 days but that yesterday it became much more intense.  He did not get a great deal of relief with a right L5 nerve block.        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  Family History  (Detailed)   SOCIAL HISTORY  (Detailed) Tobacco use reviewed. Preferred language is [place].   Smoking status: Never smoker.  SMOKING STATUS Use Status Type Smoking Status Usage Per Day Years Used Total Pack Years  no/never  Never smoker       HOME ENVIRONMENT/SAFETY The patient has not fallen in the last year.        MEDICATIONS(added, continued or stopped this visit): Started Medication Directions Instruction Stopped   Celebrex 100 mg capsule take 1 capsule by oral route 2 times every day     Dexilant 60 mg capsule, delayed release take 1 capsule by oral route  every day for 8 weeks     Dulera 100 mcg-5 mcg/actuation HFA aerosol inhaler inhale 2 puff by inhalation route 2 times every day in the morning and evening     Humira 40 mg/0.8 mL subcutaneous syringe kit inject 0.8 milliliter by subcutaneous route  every 2 weeks     hydrocodone-acetaminophen take 1 tablet by oral route  every 6 hours as needed for pain    10/11/2015 lorazepam 1 mg tablet take 1 tablet by oral route 3 times every day as needed       ALLERGIES: Ingredient Reaction Medication Name Comment  MERCAPTOPURINE Vomiting     Reviewed, updated.   REVIEW OF SYSTEMS System Neg/Pos Details  Constitutional Negative Chills, fatigue, fever,  malaise, night sweats, weight gain and weight loss.  Respiratory Negative Chronic cough, cough, dyspnea, known TB exposure and wheezing.  Cardio Negative Chest pain, claudication, edema and irregular heartbeat/palpitations.  GI Positive Ulcer.  GI Negative Abdominal pain, blood in stool, change in stool pattern, constipation, decreased appetite, diarrhea, heartburn, nausea and vomiting.  GU Negative Erectile dysfunction.  Psych Negative Anxiety, depression and insomnia.  Integumentary Negative Brittle hair, brittle nails, change in shape/size of mole(s), hair loss, hirsutism, hives, pruritus, rash and skin lesion.   MS Positive Muscle weakness.  Allergic/Immuno Negative Contact allergy, environmental allergies, food allergies and seasonal allergies.  Reproductive Negative Penile discharge and sexual dysfunction.     Vitals Date Temp F BP Pulse Ht In Wt Lb BMI BSA Pain Score  10/11/2015  142/89 88 70 155.2 22.27  6/10     PHYSICAL EXAM General Level of Distress: no acute distress Overall Appearance: normal    Cardiovascular Cardiac: regular rate and rhythm without murmur  Respiratory Lungs: clear to auscultation  Neurological Recent and Remote Memory: normal Attention Span and Concentration:   normal Language: normal Fund of Knowledge: normal  Right Left Sensation: normal normal Upper Extremity Coordination: normal normal  Lower Extremity Coordination: normal normal  Musculoskeletal Gait and Station: normal  Right Left Upper Extremity Muscle Strength: normal normal Lower Extremity Muscle Strength: normal normal Upper Extremity Muscle Tone:  normal normal Lower Extremity Muscle Tone: normal normal  Motor Strength Upper and lower extremity motor strength was tested in the clinically pertinent muscles. Any abnormal findings will be noted below.   Right Left Tib Anterior: 4-/5  EHL: 3/5    Deep Tendon Reflexes  Right Left Biceps: normal normal Triceps: normal normal Brachiloradialis: normal normal Patellar: normal normal Achilles: normal normal  Sensory Sensation was tested at L1 to S1. Any abnormal findings will be noted below.  Right Left L4: decreased   L5: decreased   S1: decreased   Cranial Nerves II. Optic Nerve/Visual Fields: normal III. Oculomotor: normal IV. Trochlear: normal V. Trigeminal: normal VI. Abducens: normal VII. Facial: normal VIII. Acoustic/Vestibular: normal IX. Glossopharyngeal: normal X. Vagus: normal XI. Spinal Accessory: normal XII. Hypoglossal: normal  Motor and other  Tests Lhermittes: negative Rhomberg: negative    Right Left Hoffman's: normal normal Clonus: normal normal Babinski: normal normal SLR: positive at 10 degrees negative Patrick's Corky Sox): negative negative Toe Walk: normal normal Toe Lift: normal normal Heel Walk: normal normal SI Joint: nontender nontender   Additional Findings:  The patient cannot stand on his right heel.  He has marked hip abductor weakness on the right at 4 minus out of 5.  She is able to stand on his toes.    IMPRESSION The patient has a large disc herniation at L4-L5 on the right.  He has significant right leg weakness.  I have recommended urgent surgery.  This will consist of right L4 L5 microdiscectomy.  We have scheduled this for 2 days from now.  Completed Orders (this encounter) Order Details Reason Side Interpretation Result Initial Treatment Date Region  Lifestyle education Continue to monitor BP if continues to be elevated contact primary care provider         Assessment/Plan # Detail Type Description   1. Assessment Elevated blood-pressure reading, w/o diagnosis of htn (R03.0).       2. Assessment Herniated nucleus pulposus, L4-5 (M51.26).       3. Assessment Right leg weakness (R29.898).       4. Assessment Lumbar radiculopathy (M54.16).  Pain Assessment/Treatment Pain Scale: 6/10. Method: Numeric Pain Intensity Scale. Location: lower back/legs. Duration: varies. Quality: discomforting.  Fall Risk Plan The patient has not fallen in the last year. Falls risk follow-up plan of care: Assisted devices: Advise to use safety measures when available.  The risks and benefits of surgery were discussed in detail.  The patient wishes to proceed.  Extensive patient teaching was performed.  Orders: Instruction(s)/Education: Assessment Instruction  R03.0 Lifestyle education    MEDICATIONS PRESCRIBED TODAY    Rx Quantity Refills  LORAZEPAM 1 mg  0 0            Provider:   Marchia Meiers. Vertell Limber MD  10/11/2015 04:54 PM Dictation edited by: Marchia Meiers. Vertell Limber    CC Providers: Erline Levine MD [address] [place], [place] [address]              Electronically signed by Marchia Meiers. Vertell Limber MD on 10/11/2015 04:54 PM

## 2015-10-16 ENCOUNTER — Encounter (HOSPITAL_COMMUNITY): Payer: Self-pay | Admitting: Neurosurgery

## 2015-10-23 ENCOUNTER — Other Ambulatory Visit: Payer: Self-pay | Admitting: Family Medicine

## 2015-10-26 ENCOUNTER — Other Ambulatory Visit: Payer: Self-pay | Admitting: Family Medicine

## 2015-11-07 ENCOUNTER — Telehealth: Payer: Self-pay | Admitting: Family Medicine

## 2015-11-08 NOTE — Telephone Encounter (Signed)
error 

## 2015-11-29 ENCOUNTER — Ambulatory Visit (INDEPENDENT_AMBULATORY_CARE_PROVIDER_SITE_OTHER): Payer: 59 | Admitting: Psychology

## 2015-11-29 DIAGNOSIS — F411 Generalized anxiety disorder: Secondary | ICD-10-CM

## 2015-12-13 ENCOUNTER — Ambulatory Visit: Payer: 59 | Admitting: Psychology

## 2015-12-25 ENCOUNTER — Ambulatory Visit (INDEPENDENT_AMBULATORY_CARE_PROVIDER_SITE_OTHER): Payer: 59 | Admitting: Psychology

## 2015-12-25 DIAGNOSIS — F4323 Adjustment disorder with mixed anxiety and depressed mood: Secondary | ICD-10-CM | POA: Diagnosis not present

## 2016-01-02 ENCOUNTER — Ambulatory Visit (INDEPENDENT_AMBULATORY_CARE_PROVIDER_SITE_OTHER): Payer: 59 | Admitting: Psychology

## 2016-01-02 DIAGNOSIS — F4323 Adjustment disorder with mixed anxiety and depressed mood: Secondary | ICD-10-CM | POA: Diagnosis not present

## 2016-01-09 ENCOUNTER — Ambulatory Visit: Payer: 59 | Admitting: Psychology

## 2016-01-11 ENCOUNTER — Ambulatory Visit (INDEPENDENT_AMBULATORY_CARE_PROVIDER_SITE_OTHER): Payer: 59 | Admitting: Family Medicine

## 2016-01-11 ENCOUNTER — Encounter: Payer: Self-pay | Admitting: Family Medicine

## 2016-01-11 ENCOUNTER — Other Ambulatory Visit: Payer: Self-pay

## 2016-01-11 VITALS — BP 122/78 | HR 98 | Ht 69.25 in | Wt 155.0 lb

## 2016-01-11 DIAGNOSIS — M659 Synovitis and tenosynovitis, unspecified: Secondary | ICD-10-CM | POA: Diagnosis not present

## 2016-01-11 DIAGNOSIS — M5417 Radiculopathy, lumbosacral region: Secondary | ICD-10-CM

## 2016-01-11 DIAGNOSIS — M79642 Pain in left hand: Secondary | ICD-10-CM

## 2016-01-11 DIAGNOSIS — Q742 Other congenital malformations of lower limb(s), including pelvic girdle: Secondary | ICD-10-CM

## 2016-01-11 DIAGNOSIS — Q688 Other specified congenital musculoskeletal deformities: Secondary | ICD-10-CM

## 2016-01-11 DIAGNOSIS — M65949 Unspecified synovitis and tenosynovitis, unspecified hand: Secondary | ICD-10-CM

## 2016-01-11 DIAGNOSIS — M5416 Radiculopathy, lumbar region: Secondary | ICD-10-CM

## 2016-01-11 NOTE — Assessment & Plan Note (Signed)
Patient given injection today. Tolerated the procedure well. We discussed icing regimen and home exercises. Discussed which activities to do in which ones to avoid. Patient will do buddy taping if necessary. Follow-up as needed.

## 2016-01-11 NOTE — Assessment & Plan Note (Signed)
Patient is still having some numbness noted. Patient also has had some loss of range of motion. Will be sent to formal physical therapy that I think will be beneficial. Patient may be a candidate for dry needling. We discussed icing regimen. Discussed vitamin supplementations for the numbness. I do not think that the numbness in his foot is secondary to the neuroma at the moment. Patient continues to have difficulty we will consider injection. I think it is a low likelihood.

## 2016-01-11 NOTE — Patient Instructions (Signed)
Good to see you  I am glad the back feels better I would start B12 1044mg daily  B6 2062mdaily  [facility] will be calling you.  They will work on the back and range of motion.  Keep swimming.  I injected the finger Good luck with the kids tonight See me again when you need me.

## 2016-01-11 NOTE — Assessment & Plan Note (Signed)
Patient is healthy and resumes training patient remained follow-up and consider surgery.

## 2016-01-11 NOTE — Progress Notes (Signed)
[name] [place], [place] [address] Phone: [phone] Subjective:    CC:  Right foot numbness, left hand pain  [id number] Charles [name] is a 45 y.o. male coming in recurrent foot pain.     recurrent patient's right foot numbness and seems to be more associated with his back. Did have surgery. States that the back pain is feeling significantly better. Patient did have a laminectomy with a microdiscectomy. Patient though unfortunately continues to have the numbness in the foot. Does not know if it is associated with the foot or the back. Patient states it does feel different. Has had a neuroma in this foot previously. May feel a little bit different.Continues to have difficulty with the back to a certain extent. Has not been to physical therapy. Wondering if he can have some guidance. States that he still has some loss of motion he feels on the right side compared to the contralateral side.  Patient is also complaining of left hand pain. First index finger. Patient states that sometimes notices swelling. Sometimes feels like he just needs to pop. Sometimes can have decreased range of motion. Denies any numbness.   history of Crohn's disease. Patient is on vitamin D supplementation.    MRI was reviewed by me today. 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 These appears stable. No edema to suggest os trigonum syndrome. Moderate fluid in the posterior recess could bedue to local synovitis.      Past medical history, social, surgical and family history all reviewed in electronic medical record.   Review of Systems: No headache, visual changes, nausea, vomiting, diarrhea, constipation, dizziness, abdominal pain, skin rash, fevers, chills, night sweats, weight loss, swollen lymph nodes, body aches, joint swelling, muscle aches, chest pain, shortness of breath, mood changes.    Objective Blood pressure 122/78, pulse 98, height 5' 9.25" (1.759 m), weight 155 lb (70.308 kg), SpO2 96 %.  General: No apparent distress alert and oriented x3 mood and affect normal, dressed appropriately.  HEENT: Pupils equal, extraocular movements intact  Respiratory: Patient's speak in full sentences and does not appear short of breath  Cardiovascular: No lower extremity edema, non tender, no erythema  Skin: Warm dry intact with no signs of infection or rash on extremities or on axial skeleton.  Abdomen: Soft nontender  Neuro: Cranial nerves II through XII are intact, neurovascularly intact in all extremities with 2+ DTRs and 2+ pulses.  Lymph: No lymphadenopathy of posterior or anterior cervical chain or axillae bilaterally.  Gait analysis shows the patient does have knees track over the medial line. MSK:  Non tender with full range of motion and good stability and symmetric strength and tone of shoulders, elbows,  hip, knee and bilaterally.  Hand exam shows on patient's left hand patient does have some mild hypertrophia of the first MCP joint. Patient is moderately tender to palpation in the area. Mild warmth to touch.  Ankle:right No visible erythema or swelling. Some tightness but seems stable from previous exam Strength is 4+ out of 5 which is improved from previous exam Stable lateral and medial ligaments; squeeze test and kleiger test unremarkable; Talar dome minorly tender on the medial aspect No pain at base of 5th MT; No tenderness over cuboid; No tenderness over N spot or navicular prominence Mild tenderness over the posterior tibialis tendon. No sign of peroneal tendon subluxations or tenderness to palpation Negative tarsal tunnel tinel's  Able to walk 4 steps. Contralateral ankle unremarkable.  Back exam minimally tender to palpation and appears palmar musculature. Patient has negative straight leg test bilaterally. Positive Faber on the right side.  Limited muscular  skeletal ultrasound was performed and interpreted by Hulan Saas, M  Limited ultrasound the patient's first MCP does shows hypoechoic changes bulging from the joint space. Mild arthritic changes. Impression: Synovitis  Procedure: Real-time Ultrasound Guided Injection of Left first MCP Device: GE Logiq E  Ultrasound guided injection is preferred based studies that show increased duration, increased effect, greater accuracy, decreased procedural pain, increased response rate, and decreased cost with ultrasound guided versus blind injection.  Verbal informed consent obtained.  Time-out conducted.  Noted no overlying erythema, induration, or other signs of local infection.  Skin prepped in a sterile fashion.  Local anesthesia: Topical Ethyl chloride.  With sterile technique and under real time ultrasound guidance:  The 25-gauge half-inch needle patient was injected with a total of 0.5 mL of 0.5% Marcaine and 0.5 mL of Kenalog 40 mg/dL.Completed without difficulty  Pain immediately resolved suggesting accurate placement of the medication.  Advised to call if fevers/chills, erythema, induration, drainage, or persistent bleeding.  Images permanently stored and available for review in the ultrasound unit.  Impression: Technically successful ultrasound guided injection.      Impression and Recommendations:

## 2016-01-11 NOTE — Progress Notes (Signed)
Pre visit review using our clinic review tool, if applicable. No additional management support is needed unless otherwise documented below in the visit note. 

## 2016-01-18 ENCOUNTER — Encounter: Payer: Self-pay | Admitting: Family Medicine

## 2016-01-23 ENCOUNTER — Encounter: Payer: Self-pay | Admitting: Family Medicine

## 2016-01-24 ENCOUNTER — Other Ambulatory Visit: Payer: Self-pay | Admitting: Internal Medicine

## 2016-01-24 ENCOUNTER — Other Ambulatory Visit: Payer: Self-pay

## 2016-01-24 DIAGNOSIS — M5441 Lumbago with sciatica, right side: Secondary | ICD-10-CM

## 2016-01-25 ENCOUNTER — Other Ambulatory Visit: Payer: Self-pay | Admitting: Internal Medicine

## 2016-02-11 IMAGING — MR MR ANKLE*R* W/O CM
3 of 5 series · 8 of 40 positions shown · non-contrast
Comparison: MRI 05/15/2010

CLINICAL DATA: Persistent posterior ankle and lower leg pain for
approximately 1 year, especially when running.

EXAM:
MRI OF THE RIGHT ANKLE WITHOUT CONTRAST
TECHNIQUE: Multiplanar, multisequence MR imaging of the ankle was performed. No
intravenous contrast was administered.

[Series 3: PD fat-sat · axial · right · 4.0mm · 0.21mm/px · z∈[-74,+17]mm · 3 of 27 slices shown]
[im 4/27]
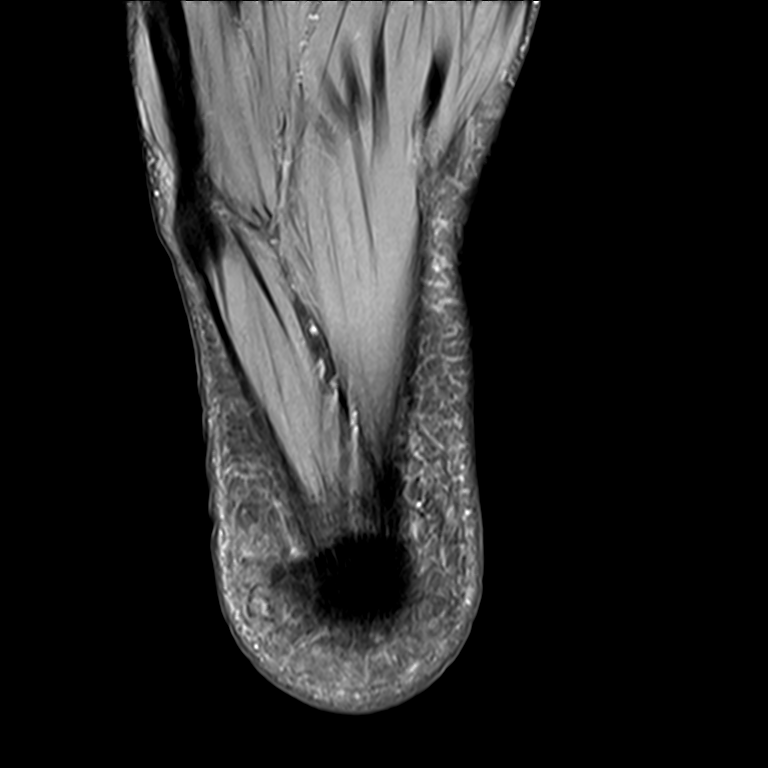
[im 15/27]
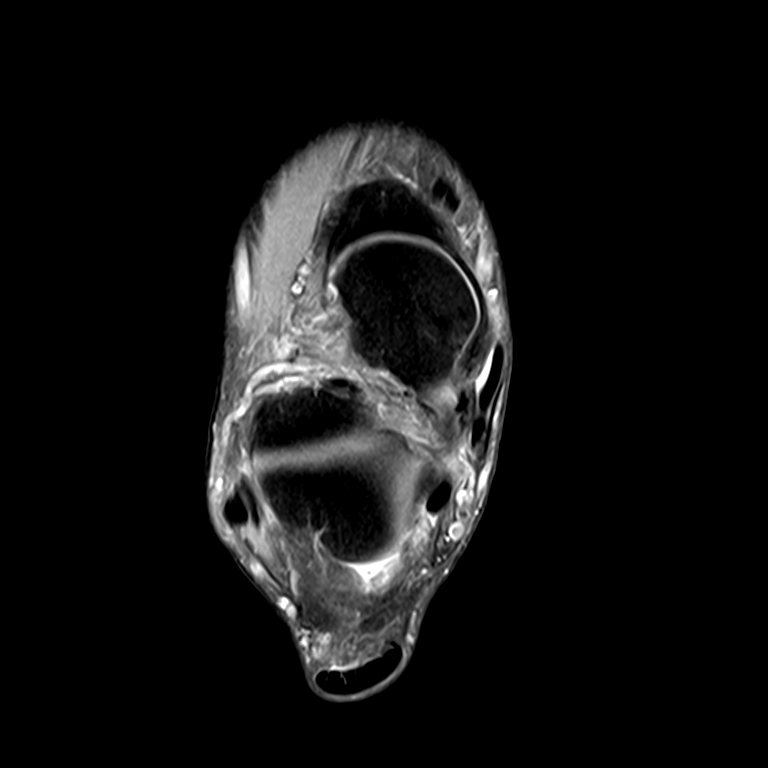
[im 23/27]
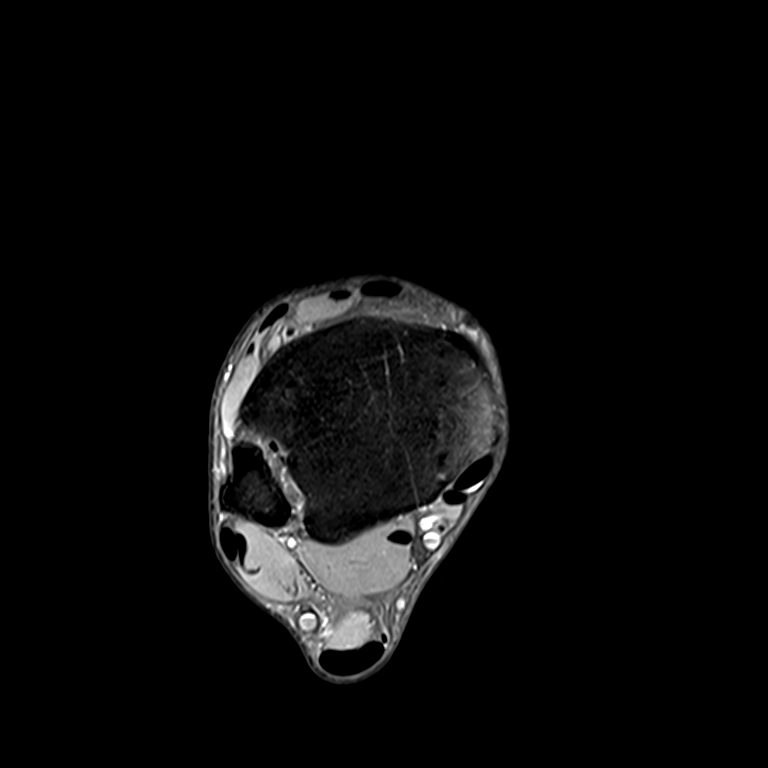

[Series 4: T2 fat-sat · axial · right · 4.0mm · 0.20mm/px · z∈[-74,+17]mm · 3 of 27 slices shown (1 of 2)]
[im 4/27]
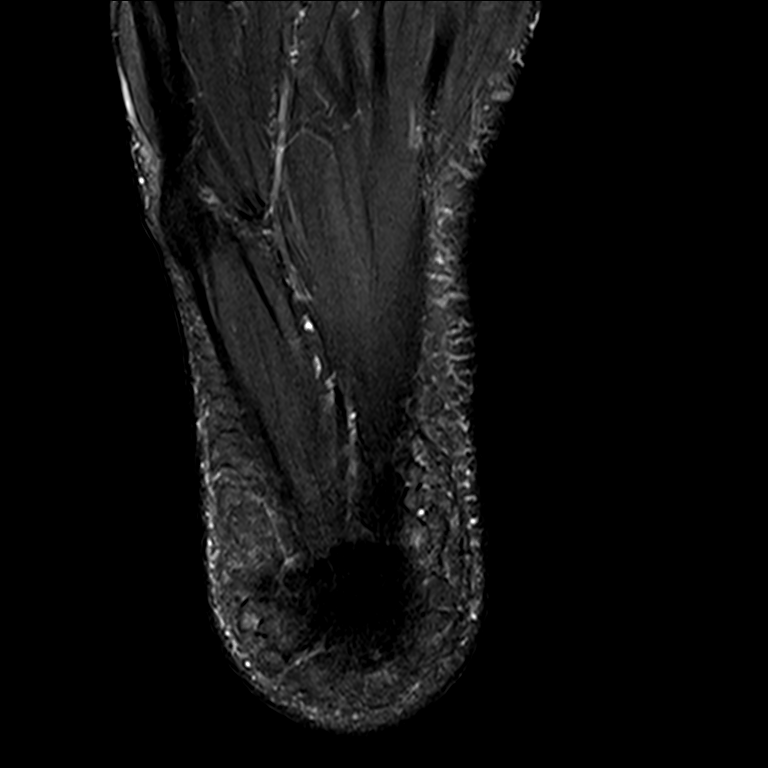
[im 15/27]
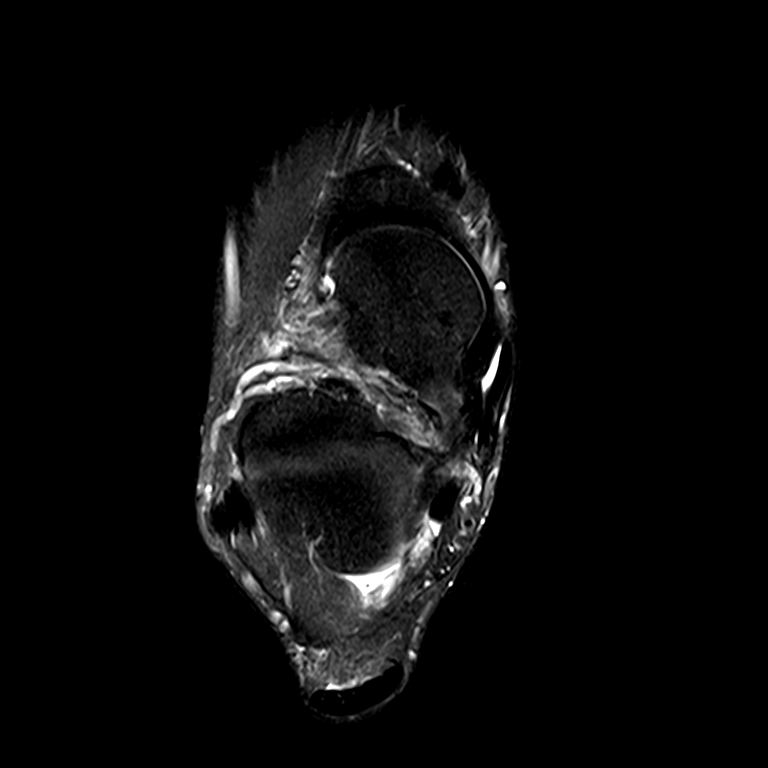
[im 23/27]
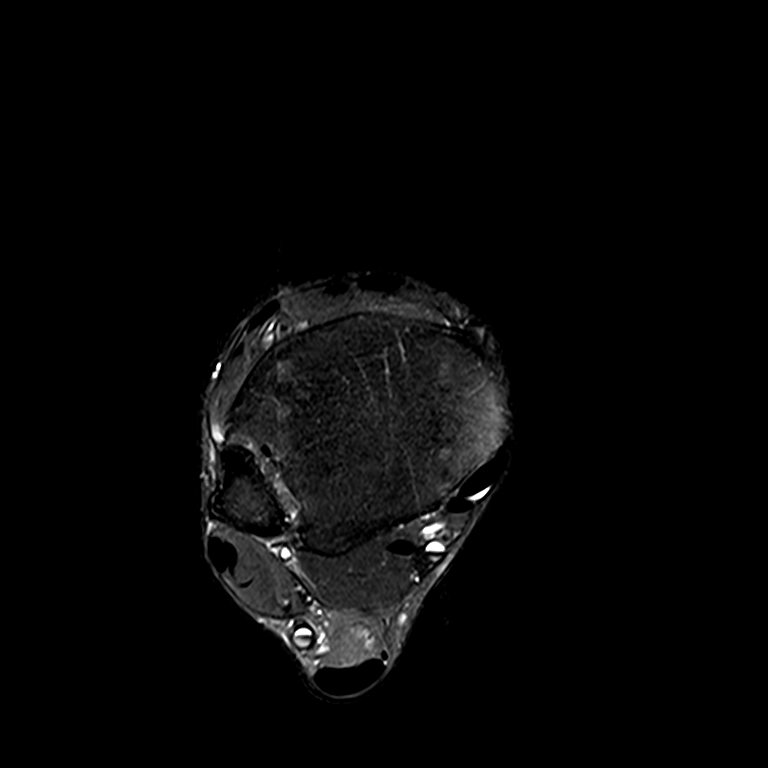

[Series 5: T2 fat-sat · sagittal · right · 3.0mm · 0.21mm/px · 2 of 23 slices shown (2 of 2)]
[im 4/23]
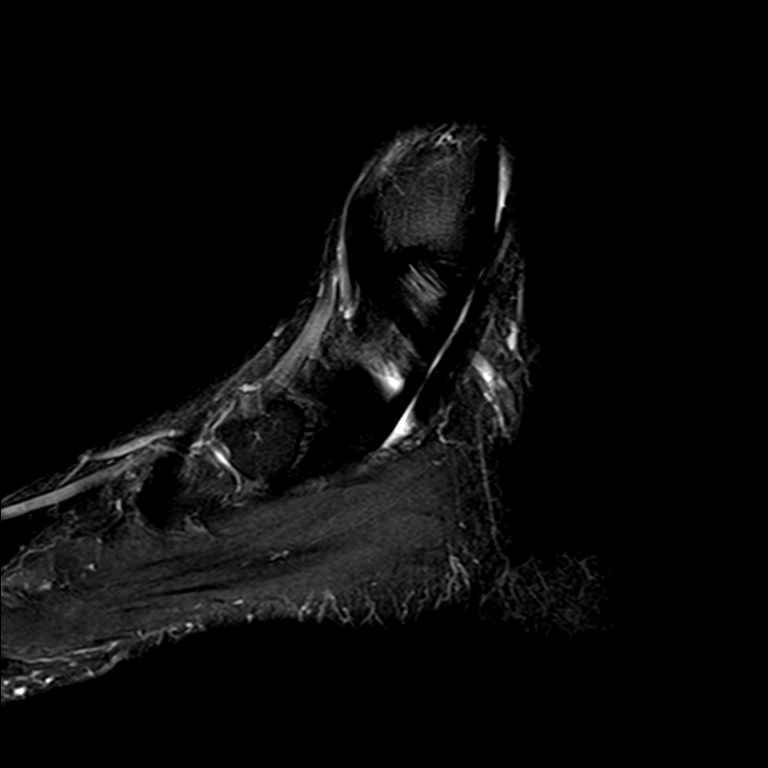
[im 12/23]
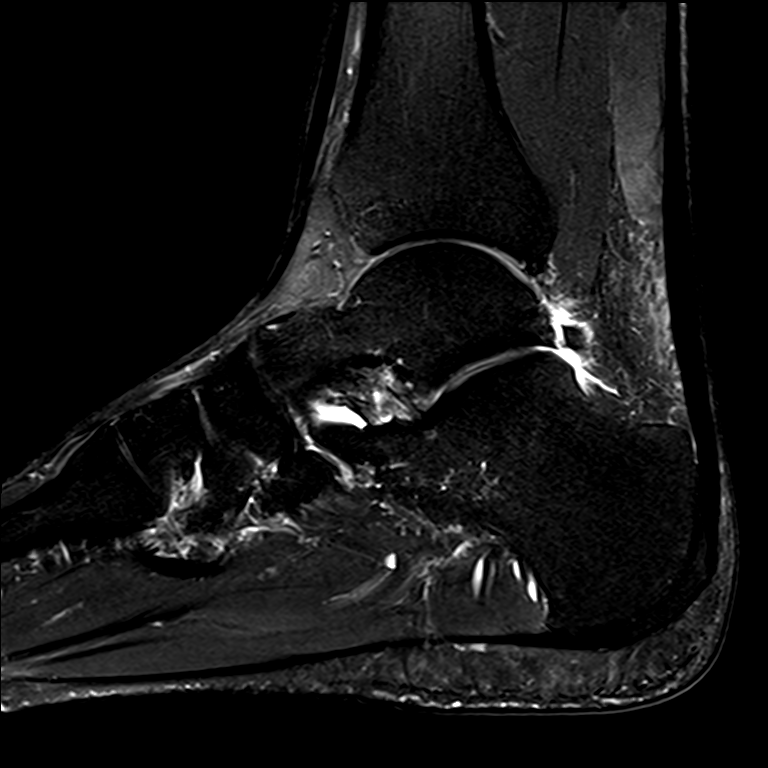

[8 of 40 positions shown; findings below may reference images not displayed]

FINDINGS: TENDONS

Peroneal: Intact. No tendinopathy or significant tenosynovitis.
There is an accessory peroneus tendon or peroneus quartus. This can
cause crowding in peroneal compartment but I do not see any
tendinopathy involving the peroneus brevis or longus tendons.

Posteromedial: Intact. Mild tenosynovitis involving the posterior
tibialis tendon.

Anterior: Normal.

Achilles: Normal.  No tendinopathy/ tendinitis.

Plantar Fascia: Normal.

LIGAMENTS

Lateral: Intact.

Medial: Intact.

CARTILAGE

Ankle Joint: There are early degenerative changes with degenerative
chondrosis/chondromalacia new since prior MR examination. No focal
full-thickness cartilage defect or osteochondral lesion. No joint
effusions/synovitis.

Subtalar Joints/Sinus Tarsi: The subtalar joints are maintained. The
sinus tarsi is normal. The Perlitha and cervical ligaments are intact.
The spring ligament is intact.

Large os trigone along with small adjacent bone fragments which are
likely small ununited secondary ossification centers. No edema to
suggest os trigonum syndrome. Moderate fluid in the posterior recess
surrounding the trigonum.

Bones: No stress fracture or AVN. Areas increased signal intensity
in the calcaneus near the os trigonum likely due to invagination of
vascular tissue. This is unchanged.

Muscles: The lower calf muscles demonstrate normal signal intensity.
No muscle strain, partial tear or myositis. The short flexor muscles
also appear normal.
IMPRESSION: 1. Early tibiotalar joint degenerative changes without
full-thickness cartilage defect or osteochondral lesion. Findings
are progressive since the prior MRI from [DATE]. Large os trigonum with smaller bone fragments between the os and
the posterior talus. These appears stable. No edema to suggest os
trigonum syndrome. Moderate fluid in the posterior recess could be
due to local synovitis.
3. No stress fracture or AVN.
4. Intact medial and lateral ankle ligaments and tendons. Mild
tenosynovitis involving the posterior tibialis tendon.
5. Incidental note is made of a probable accessory peroneus muscle
and tendon or peroneus quartus.

## 2016-03-27 ENCOUNTER — Other Ambulatory Visit: Payer: Self-pay | Admitting: Internal Medicine

## 2016-04-19 ENCOUNTER — Other Ambulatory Visit: Payer: Self-pay | Admitting: Internal Medicine

## 2016-04-26 ENCOUNTER — Other Ambulatory Visit: Payer: Self-pay | Admitting: Family Medicine

## 2016-04-26 ENCOUNTER — Telehealth: Payer: Self-pay | Admitting: Family Medicine

## 2016-04-26 NOTE — Telephone Encounter (Signed)
Called and left message for pt to return call to office. Pt has not been seen in office since 2015. Appointment is needed for refills.

## 2016-04-26 NOTE — Telephone Encounter (Signed)
Pt at pharmacy needing Rx zolmitriptan (ZOMIG).  Pharm:  [facility]

## 2016-04-30 NOTE — Telephone Encounter (Signed)
Filled by Dr. Sherren Mocha

## 2016-05-02 ENCOUNTER — Encounter: Payer: Self-pay | Admitting: Family Medicine

## 2016-05-03 ENCOUNTER — Encounter: Payer: Self-pay | Admitting: Family Medicine

## 2016-05-07 ENCOUNTER — Encounter: Payer: Self-pay | Admitting: Family Medicine

## 2016-05-07 ENCOUNTER — Ambulatory Visit (INDEPENDENT_AMBULATORY_CARE_PROVIDER_SITE_OTHER): Payer: 59 | Admitting: Family Medicine

## 2016-05-07 VITALS — BP 118/76 | HR 82 | Temp 98.4°F | Wt 160.3 lb

## 2016-05-07 DIAGNOSIS — Z111 Encounter for screening for respiratory tuberculosis: Secondary | ICD-10-CM

## 2016-05-07 DIAGNOSIS — K50818 Crohn's disease of both small and large intestine with other complication: Secondary | ICD-10-CM

## 2016-05-07 DIAGNOSIS — Z23 Encounter for immunization: Secondary | ICD-10-CM | POA: Diagnosis not present

## 2016-05-07 DIAGNOSIS — Z8639 Personal history of other endocrine, nutritional and metabolic disease: Secondary | ICD-10-CM

## 2016-05-07 DIAGNOSIS — M8580 Other specified disorders of bone density and structure, unspecified site: Secondary | ICD-10-CM

## 2016-05-07 DIAGNOSIS — E559 Vitamin D deficiency, unspecified: Secondary | ICD-10-CM

## 2016-05-07 DIAGNOSIS — Z0000 Encounter for general adult medical examination without abnormal findings: Secondary | ICD-10-CM

## 2016-05-07 NOTE — Progress Notes (Signed)
Charles [name] is a 45 year old married male nonsmoker who comes in today for general physical examination because of a history of an inflammatory bowel disease  He continues to see his rheumatologist on a regular basis and is on Humira 40 mg every 2 weeks. He's doing well no complications. He's had a history of vitamin D deficiency and is on vitamin D 1000 units daily. He's also had a history of reflux that actually  Triggered a severe cough. He's been on this now for about 3 years. He wonders if he can come off the PPI.  He's tapered off the Neurontin. He had a lumbar disc surgery last  Winter did well no complications except he still has slight foot drop in his right foot.  He takes Ativan 1 mg daily at bedtime when necessary for sleep but only very rarely. Pulmonary-wise he's off his inhaler and doing well no complications.  Vaccinations up-to-date tetanus booster 2012 TB skin test is mandated by work was done today will be read in 48-72 hours.  He gets routine eye care, dental care, because of his history of inflammatory bowel disease he's had his: Totally removed.  10 years ago he developed transient hyperthyroidism. He wonders if his TSH level was normal. He was also told year ago this vitamin D B12 level was low. He took B12 for a while then stopped it.  He has 2 lesions he would like check one on his right foot and one on his scrotum.  Review of systems otherwise negative except he's back on his exercise program of walking swimming and cycling and not running. Advised not to run do anything strenuous with his back for at least a year.  Physical examination  Vital signs stable he is afebrile HEENT were negative neck was supple no adenopathy thyroid normal cardiopulmonary exam normal abdominal exam normal except for scar midline and right lower quadrant from previous colectomy and colostomy. Genitalia normal male except for a sebaceous cyst. After alcohol prep a sterile needle was used to excise the  cyst. Extremity double skin no peripheral pulses normal except for a corn on his right foot which was excised with a sterile 15 blade. He's had a history of dysplastic nevi. Total body skin exam shows multiple lesions but nothing appears to be abnormal  Impression  #1 inflammatory bowel disease.......... asymptomatic on Humira............ continue matter.......... check PPD  #2 corn right foot......Marland Kitchen excised  #3 sebaceous cysts scrotum.......Marland Kitchen excised with a sterile needle  #4 history of dysplastic nevi........... normal skin exam today  #5 history of asthma........... now asymptomatic off medications........ question if the reflux didn't trigger the asthma or he has a form of exercise induced asthma.  #6 history of hyperthyroidism resolved spontaneously........ check TSH level  #7 history of vitamin B12 and vitamin D deficiency...Marland KitchenMarland KitchenMarland Kitchen continue supplements check levels today

## 2016-05-07 NOTE — Patient Instructions (Signed)
Taper the DExilant,,,,,,,,,,,, 1 Monday Wednesday Friday for 2 months then stop if your okay  Labs today,,,,,,,,,,, I will call you the report  Return in 2-3 days for redo or TB skin test  Return in one year for general physical exam sooner if any problems

## 2016-05-07 NOTE — Progress Notes (Signed)
Pre visit review using our clinic review tool, if applicable. No additional management support is needed unless otherwise documented below in the visit note. 

## 2016-05-08 LAB — CBC WITH DIFFERENTIAL/PLATELET
BASOS ABS: 0 10*3/uL (ref 0.0–0.1)
Basophils Relative: 0.5 % (ref 0.0–3.0)
EOS ABS: 0.2 10*3/uL (ref 0.0–0.7)
Eosinophils Relative: 2.6 % (ref 0.0–5.0)
HCT: 36.5 % — ABNORMAL LOW (ref 39.0–52.0)
Hemoglobin: 12.2 g/dL — ABNORMAL LOW (ref 13.0–17.0)
LYMPHS ABS: 2.2 10*3/uL (ref 0.7–4.0)
Lymphocytes Relative: 27.2 % (ref 12.0–46.0)
MCHC: 33.4 g/dL (ref 30.0–36.0)
MCV: 79.1 fl (ref 78.0–100.0)
MONO ABS: 0.9 10*3/uL (ref 0.1–1.0)
MONOS PCT: 10.5 % (ref 3.0–12.0)
NEUTROS PCT: 59.2 % (ref 43.0–77.0)
Neutro Abs: 4.9 10*3/uL (ref 1.4–7.7)
Platelets: 330 10*3/uL (ref 150.0–400.0)
RBC: 4.62 Mil/uL (ref 4.22–5.81)
RDW: 14.8 % (ref 11.5–15.5)
WBC: 8.3 10*3/uL (ref 4.0–10.5)

## 2016-05-08 LAB — BASIC METABOLIC PANEL
BUN: 11 mg/dL (ref 6–23)
CHLORIDE: 101 meq/L (ref 96–112)
CO2: 32 mEq/L (ref 19–32)
Calcium: 9.6 mg/dL (ref 8.4–10.5)
Creatinine, Ser: 1.05 mg/dL (ref 0.40–1.50)
GFR: 81.72 mL/min (ref >60.00)
Glucose, Bld: 91 mg/dL (ref 70–99)
POTASSIUM: 4.1 meq/L (ref 3.5–5.1)
Sodium: 140 mEq/L (ref 135–145)

## 2016-05-08 LAB — HEPATIC FUNCTION PANEL
ALT: 18 U/L (ref 0–53)
AST: 18 U/L (ref 0–37)
Albumin: 4.2 g/dL (ref 3.5–5.2)
Alkaline Phosphatase: 56 U/L (ref 39–117)
BILIRUBIN DIRECT: 0.2 mg/dL (ref 0.0–0.3)
BILIRUBIN TOTAL: 1.1 mg/dL (ref 0.2–1.2)
Total Protein: 7.3 g/dL (ref 6.0–8.3)

## 2016-05-08 LAB — LIPID PANEL
CHOL/HDL RATIO: 4
Cholesterol: 185 mg/dL (ref 0–200)
HDL: 52.2 mg/dL (ref >39.00)
NONHDL: 132.53
TRIGLYCERIDES: 260 mg/dL — AB (ref 0.0–149.0)
VLDL: 52 mg/dL — ABNORMAL HIGH (ref 0.0–40.0)

## 2016-05-08 LAB — VITAMIN B12: VITAMIN B 12: 575 pg/mL (ref 211–911)

## 2016-05-08 LAB — LDL CHOLESTEROL, DIRECT: Direct LDL: 101 mg/dL

## 2016-05-08 LAB — TSH: TSH: 2.58 u[IU]/mL (ref 0.35–4.50)

## 2016-05-10 LAB — VITAMIN D 1,25 DIHYDROXY
VITAMIN D 1, 25 (OH) TOTAL: 38 pg/mL (ref 18–72)
Vitamin D2 1, 25 (OH)2: <8 pg/mL
Vitamin D3 1, 25 (OH)2: 38 pg/mL

## 2016-05-20 LAB — TB SKIN TEST
Induration: 0 mm
TB Skin Test: NEGATIVE

## 2016-05-28 ENCOUNTER — Other Ambulatory Visit: Payer: Self-pay | Admitting: Family Medicine

## 2016-05-28 MED ORDER — TRAMADOL HCL 50 MG PO TABS: ORAL_TABLET | ORAL | 2 refills | Status: DC

## 2016-05-28 MED ORDER — TRAMADOL HCL 50 MG PO TABS: 50.0000 mg | ORAL_TABLET | Freq: Three times a day (TID) | ORAL | 2 refills | Status: DC | PRN

## 2016-05-28 MED ORDER — LORAZEPAM 1 MG PO TABS: 1.0000 mg | ORAL_TABLET | Freq: Every day | ORAL | 2 refills | Status: DC

## 2016-05-29 NOTE — Progress Notes (Signed)
[name] [place], [place] [address] Phone: [phone] Subjective:    I'm seeing this patient by the request  of:  TODD,JEFFREY ALLEN, MD   CC: Right wrist pain  LKG:[id number]  Charles [name] is a 45 y.o. male coming in with complaint of right wrist pain. He'll come in for patient. Having some mild pain. Worse with activity such as repetitive motion or even waking him up at night. Mild numbness with it. Seems to be the whole hand. Does not remember any true injury. Has been doing more lifting than running because of the pain in his ankle. Numbness of the foot but likely from his back.     Past Medical History:  Diagnosis Date  . Allergy   . Arthritis   . Arthropathy    and scroilitis  . Asthma   . Blood transfusion without reported diagnosis   . 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 Crohn's ileocolitis ([facility]) 08/29/2008  . Dysplastic nevi   . GERD (gastroesophageal reflux disease)   . Herniated disc   . History of proctitis 2006  . IBD (inflammatory bowel disease)   . Incisional hernia   . Migraine headache   . Morton's neuroma of right foot   . Osteopenia   . 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 Small bowel obstruction due to adhesions 02/09/2015  . Thyroid disease   . Vitamin D deficiency    Past Surgical History:  Procedure Laterality Date  . COLECTOMY    . COLONOSCOPY W/ BIOPSIES  04/16/2006   crohn's colitis  . FLEXIBLE SIGMOIDOSCOPY  2006 - 02/2014   ileocolitis, pouchitis.  with pouch ulcer.   . FUNCTIONAL ENDOSCOPIC SINUS SURGERY  12/2003   partial ethmoidectomy.  Dr Wilburn Cornelia  . ileoanal pull-through    . LUMBAR LAMINECTOMY/DECOMPRESSION MICRODISCECTOMY Right 10/13/2015   Procedure: Right Lumbar four- five Microdiskectomy;  Surgeon: Erline Levine, MD;  Location: [facility];  Service: Neurosurgery;  Laterality: Right;  Right L4-5 Microdiskectomy  . sacroilitis    . SIGMOIDOSCOPY  01/2005  . VENTRAL HERNIA  REPAIR  06/2014   Social History   Social History  . Marital status: Married    Spouse name: N/A  . Number of children: 2  . Years of education: N/A   Occupational History  . [employer]   Social History Main Topics  . Smoking status: Never Smoker  . Smokeless tobacco: Never Used  . Alcohol use 8.4 oz/week    14 Glasses of wine per week     Comment: couple of drinks daily  . Drug use: No  . Sexual activity: Yes   Other Topics Concern  . Not on file   Social History Narrative   Father a pulmonologist in [place].   Married to Dr. Honor Junes daughter.   Wife is CRNA   Allergies  Allergen Reactions  . Mercaptopurine Nausea And Vomiting    Felt ill within 2 days of initiating therapy.   Family History  Problem Relation Age of Onset  . Colon cancer      grandmother  . Ulcerative colitis Mother     Past medical history, social, surgical and family history all reviewed in electronic medical record.  No pertanent information unless stated regarding to the chief complaint.   Review of Systems: No headache, visual changes, nausea, vomiting, diarrhea, constipation, dizziness, abdominal pain, skin rash, fevers, chills, night sweats, weight loss, swollen lymph nodes, body aches, joint  swelling, muscle aches, chest pain, shortness of breath, mood changes.   Objective  There were no vitals taken for this visit.  General: No apparent distress alert and oriented x3 mood and affect normal, dressed appropriately.  HEENT: Pupils equal, extraocular movements intact  Respiratory: Patient's speak in full sentences and does not appear short of breath  Cardiovascular: No lower extremity edema, non tender, no erythema  Skin: Warm dry intact with no signs of infection or rash on extremities or on axial skeleton.  Abdomen: Soft nontender  Neuro: Cranial nerves II through XII are intact, neurovascularly intact in all extremities with 2+ DTRs and 2+ pulses.  Lymph: No  lymphadenopathy of posterior or anterior cervical chain or axillae bilaterally.  Gait normal with good balance and coordination.  MSK:  Non tender with full range of motion and good stability and symmetric strength and tone of shoulders, elbows,  hip, knee and ankles bilaterally.  Wrist: Right Inspection normal with no visible erythema or swelling. ROM smooth and normal with good flexion and extension and ulnar/radial deviation that is symmetrical with opposite wrist. Palpation is normal over metacarpals, navicular, lunate, and TFCC; tendons without tenderness/ swelling No snuffbox tenderness. No tenderness over Canal of Guyon. Strength 5/5 in all directions without pain. Negative Finkelstein, positive tinel's and phalens. Negative Watson's test. Contralateral wrist unremarkable  Procedure: Real-time Ultrasound Guided Injection of right carpal tunnel Device: GE Logiq E  Ultrasound guided injection is preferred based studies that show increased duration, increased effect, greater accuracy, decreased procedural pain, increased response rate with ultrasound guided versus blind injection.  Verbal informed consent obtained.  Time-out conducted.  Noted no overlying erythema, induration, or other signs of local infection.  Skin prepped in a sterile fashion.  Local anesthesia: Topical Ethyl chloride.  With sterile technique and under real time ultrasound guidance:  median nerve visualized.  23g 5/8 inch needle inserted distal to proximal approach into nerve sheath. Pictures taken nfor needle placement. Patient did have injection of 2 cc of 1% lidocaine, 1 cc of 0.5% Marcaine, and 1 cc of Kenalog 40 mg/dL. Completed without difficulty  Pain immediately resolved suggesting accurate placement of the medication.  Advised to call if fevers/chills, erythema, induration, drainage, or persistent bleeding.  Images permanently stored and available for review in the ultrasound unit.  Impression: Technically  successful ultrasound guided injection.    Impression and Recommendations:     This case required medical decision making of moderate complexity.      Note: This dictation was prepared with Dragon dictation along with smaller phrase technology. Any transcriptional errors that result from this process are unintentional.

## 2016-05-30 ENCOUNTER — Other Ambulatory Visit: Payer: Self-pay

## 2016-05-30 ENCOUNTER — Ambulatory Visit (INDEPENDENT_AMBULATORY_CARE_PROVIDER_SITE_OTHER): Payer: 59 | Admitting: Family Medicine

## 2016-05-30 ENCOUNTER — Encounter: Payer: Self-pay | Admitting: Family Medicine

## 2016-05-30 VITALS — BP 130/80 | HR 96 | Wt 160.0 lb

## 2016-05-30 DIAGNOSIS — M25531 Pain in right wrist: Secondary | ICD-10-CM

## 2016-05-30 DIAGNOSIS — G5601 Carpal tunnel syndrome, right upper limb: Secondary | ICD-10-CM

## 2016-05-30 NOTE — Patient Instructions (Signed)
Good to see you  Injected carpal tunnel and should do good Careful driving Keep it up otherwise The toe is the back See me when you need me

## 2016-05-30 NOTE — Assessment & Plan Note (Signed)
New finding. Given an injection today. Topical anti-inflammatory's prescribed again. Once weekly vitamin D refill. Discuss with home exercises and extension and bracing. If worsening symptoms we'll consider further EMG workup. Otherwise follow-up as needed.

## 2016-06-06 ENCOUNTER — Ambulatory Visit: Payer: Self-pay

## 2016-06-06 DIAGNOSIS — M25531 Pain in right wrist: Secondary | ICD-10-CM

## 2016-06-11 ENCOUNTER — Other Ambulatory Visit: Payer: Self-pay | Admitting: Internal Medicine

## 2016-06-11 ENCOUNTER — Encounter: Payer: Self-pay | Admitting: Internal Medicine

## 2016-06-11 ENCOUNTER — Ambulatory Visit (INDEPENDENT_AMBULATORY_CARE_PROVIDER_SITE_OTHER): Payer: 59 | Admitting: Internal Medicine

## 2016-06-11 VITALS — BP 110/84 | HR 84 | Ht 69.25 in | Wt 157.1 lb

## 2016-06-11 DIAGNOSIS — K5090 Crohn's disease, unspecified, without complications: Secondary | ICD-10-CM

## 2016-06-11 DIAGNOSIS — K219 Gastro-esophageal reflux disease without esophagitis: Secondary | ICD-10-CM

## 2016-06-11 DIAGNOSIS — M542 Cervicalgia: Secondary | ICD-10-CM | POA: Diagnosis not present

## 2016-06-11 MED ORDER — HYDROCORTISONE 2.5 % RE CREA: 1.0000 "application " | TOPICAL_CREAM | Freq: Every day | RECTAL | 2 refills | Status: DC | PRN

## 2016-06-11 MED ORDER — DEXLANSOPRAZOLE 30 MG PO CPDR: 30.0000 mg | DELAYED_RELEASE_CAPSULE | Freq: Every day | ORAL | 2 refills | Status: DC

## 2016-06-11 NOTE — Progress Notes (Signed)
Charles [name] 45 y.o. Feb 08, 1971 [id number]  Assessment & Plan:   1. Crohn's disease of intestine without complication ([place])   2. Gastroesophageal reflux disease, esophagitis presence not specified   3. Neck pain     Taper dexilant if possible. He will try 30 mg alternating with 16 and 30 mg daily 30 mg alternating with non-and try to taper off.  Flex next year, reassess pouch  Continue Humira has had flue shot and negative TB test last month  Neck pain seems related to his headaches, he may need to follow-up with Dr. Tamala Julian on this. Gentle stretching advised. Subjective:   Chief Complaint: Follow-up of Crohn's disease. HPI  Charles [name] seems to be doing well without significant problems though occasionally gets altered bowel habits where he feels like things aren't moving through well, he will use a generic suppository and get some relief and empty out. In general he has done well with regard to his Crohn's and pouchitis issues however. He has been trying to wean from the dexilant. It's not clear exactly why he has been on that ovary years, he has been on a PPI for some upper GI symptoms, we don't think he has had a clear-cut diagnosis of true GERD released erosive esophagitis. His primary care provider recommended trying tapering alternating with Exelon 60 mg and non-. It's been a bit of a struggle. Having some musculoskeletal issues. Back pain has limited his running. Tramadol seems to help. He had spinal surgery and still has numbness in his foot unfortunately and that has actually been the main limit on his running.He has noted no more asthma problems and not even needing medications now that he is not exercising in the form of running.  Allergies  Allergen Reactions  . Mercaptopurine Nausea And Vomiting    Felt ill within 2 days of initiating therapy.   Outpatient Medications Prior to Visit  Medication Sig Dispense Refill  . albuterol (PROVENTIL HFA;VENTOLIN HFA) 108 (90 BASE)  MCG/ACT inhaler Inhale 2 puffs into the lungs every 6 (six) hours as needed. (Patient taking differently: Inhale 2 puffs into the lungs every 6 (six) hours as needed for wheezing. ) 18 g 3  . AZASITE 1 % ophthalmic solution     . cholecalciferol (VITAMIN D) 1000 units tablet Take 1,000 Units by mouth daily.    Marland Kitchen DEXILANT 60 MG capsule TAKE ONE CAPSULE BY MOUTH EVERY MORNING BEFORE BREAKFAST 90 capsule 3  . erythromycin ophthalmic ointment APPLY TO INNER LID 4 TIMES DAILY FOR 2 DAYS, THEN TWICE DAILY FOR 4 WEEKS  0  . HUMIRA PEN 40 MG/0.8ML PNKT INJECT 40 MG SUBCUTANEOUSLY EVERY 2 WEEKS 2 each 3  . LORazepam (ATIVAN) 1 MG tablet Take 1 tablet (1 mg total) by mouth at bedtime. 90 tablet 2  . mometasone-formoterol (DULERA) 100-5 MCG/ACT AERO Inhale 2 puffs into the lungs daily. Inhale 2 puffs into the lungs 2 times daily 4 Inhaler 3  . Probiotic Product (VSL#3 DS) PACK DISSOLVE 1 PACKET IN FLUID AND TAKE EVERY DAY 30 each 1  . traMADol (ULTRAM) 50 MG tablet 1 tablet in the morning for back pain 100 tablet 2  . Vitamin D, Ergocalciferol, (DRISDOL) 50000 units CAPS capsule TAKE ONE CAPSULE BY MOUTH EVERY 7 DAYS FOR EIGHT doses  0  . ANUSOL-HC 25 MG suppository Place 1 suppository (25 mg total) rectally at bedtime as needed for hemorrhoids or itching. 24 suppository 0  . celecoxib (CELEBREX) 100 MG capsule Take 1 capsule (100 mg  total) by mouth 2 (two) times daily as needed. (Patient taking differently: Take 100 mg by mouth 2 (two) times daily as needed for mild pain. ) 60 capsule 3  . gabapentin (NEURONTIN) 100 MG capsule Take 2 capsules (200 mg total) by mouth 3 (three) times daily. 180 capsule 3   No facility-administered medications prior to visit.    Past Medical History:  Diagnosis Date  . Allergy   . Arthritis   . Arthropathy    and scroilitis  . Asthma   . Blood transfusion without reported diagnosis   . Chronic drug-induced interstitial lung disorders ([place])   . Crohn's ileocolitis ([place])  08/29/2008  . Dysplastic nevi   . GERD (gastroesophageal reflux disease)   . Herniated disc   . History of proctitis 2006  . IBD (inflammatory bowel disease)   . Incisional hernia   . Migraine headache   . Morton's neuroma of right foot   . Osteopenia   . 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 Small bowel obstruction due to adhesions 02/09/2015  . Thyroid disease   . Vitamin D deficiency    Past Surgical History:  Procedure Laterality Date  . COLECTOMY    . COLONOSCOPY W/ BIOPSIES  04/16/2006   crohn's colitis  . FLEXIBLE SIGMOIDOSCOPY  2006 - 02/2014   ileocolitis, pouchitis.  with pouch ulcer.   . FUNCTIONAL ENDOSCOPIC SINUS SURGERY  12/2003   partial ethmoidectomy.  Dr Wilburn Cornelia  . ileoanal pull-through    . LUMBAR LAMINECTOMY/DECOMPRESSION MICRODISCECTOMY Right 10/13/2015   Procedure: Right Lumbar four- five Microdiskectomy;  Surgeon: Erline Levine, MD;  Location: [facility];  Service: Neurosurgery;  Laterality: Right;  Right L4-5 Microdiskectomy  . sacroilitis    . SIGMOIDOSCOPY  01/2005  . VENTRAL HERNIA REPAIR  06/2014   Social History   Social History  . Marital status: Married    Spouse name: N/A  . Number of children: 2  . Years of education: N/A   Occupational History  . [employer]   Social History Main Topics  . Smoking status: Never Smoker  . Smokeless tobacco: Never Used  . Alcohol use 8.4 oz/week    14 Glasses of wine per week     Comment: couple of drinks daily  . Drug use: No  . Sexual activity: Yes   Other Topics Concern  . None   Social History Narrative   Father a Technical brewer in [place].   Married to Dr. Honor Junes daughter.   Wife is CRNA   Family History  Problem Relation Age of Onset  . Ulcerative colitis Mother   . Colon cancer      grandmother       Review of Systems As above. He can't run anymore. He's been walking a lot.  Objective:   Physical Exam BP 110/84 (BP Location: Left Arm, Patient Position: Sitting, Cuff  Size: Normal)   Pulse 84   Ht 5' 9.25" (1.759 m)   Wt 157 lb 2 oz (71.3 kg)   BMI 23.04 kg/m  Thin no acute distress Lungs clear Heart S1-S2 no rubs or gallops Abdomen is thin soft and nontender surgical scars noted  Normal metabolic panel September 6226 Negative PPD Vitamin D level up to 38 Hemoglobin slightly low at 12 stable, MCV 79 which is stable.

## 2016-06-11 NOTE — Patient Instructions (Addendum)
  Please alternate your 26m and 341mDexilant and then take the 3080mexilant daily and then do the 74m8mxilant on and off until your completely off.  We have sent the following medications to your pharmacy for you to pick up at your convenience: Dexilant and hydrocortisone cream  Use the hydrocortisone cream on top of your Prep H suppositories.   I appreciate the opportunity to care for you. CarlSilvano Rusk, FACGRobert Wood Johnson University Hospital At [facility]

## 2016-06-11 NOTE — Telephone Encounter (Signed)
OK to refill x 12

## 2016-06-11 NOTE — Telephone Encounter (Signed)
Refill Sir?

## 2016-06-13 ENCOUNTER — Encounter: Payer: Self-pay | Admitting: Internal Medicine

## 2016-07-03 ENCOUNTER — Other Ambulatory Visit: Payer: Self-pay | Admitting: Internal Medicine

## 2016-07-03 NOTE — Telephone Encounter (Signed)
How many refills Sir?  Seen in Oct.

## 2016-07-03 NOTE — Telephone Encounter (Signed)
Refill x 6

## 2016-07-04 ENCOUNTER — Encounter: Payer: Self-pay | Admitting: Family Medicine

## 2016-07-05 ENCOUNTER — Ambulatory Visit (INDEPENDENT_AMBULATORY_CARE_PROVIDER_SITE_OTHER): Payer: 59 | Admitting: Family Medicine

## 2016-07-05 ENCOUNTER — Encounter: Payer: Self-pay | Admitting: Family Medicine

## 2016-07-05 DIAGNOSIS — M659 Synovitis and tenosynovitis, unspecified: Secondary | ICD-10-CM

## 2016-07-05 DIAGNOSIS — F411 Generalized anxiety disorder: Secondary | ICD-10-CM

## 2016-07-05 DIAGNOSIS — E559 Vitamin D deficiency, unspecified: Secondary | ICD-10-CM | POA: Diagnosis not present

## 2016-07-05 MED ORDER — VITAMIN D (ERGOCALCIFEROL) 1.25 MG (50000 UNIT) PO CAPS: ORAL_CAPSULE | ORAL | 0 refills | Status: DC

## 2016-07-05 MED ORDER — HYDROXYZINE HCL 25 MG PO TABS: 25.0000 mg | ORAL_TABLET | Freq: Three times a day (TID) | ORAL | 1 refills | Status: DC | PRN

## 2016-07-05 NOTE — Assessment & Plan Note (Signed)
Refilled vitamin D

## 2016-07-05 NOTE — Assessment & Plan Note (Signed)
Seems to be situational. Patient given a truck sustaining to see if this will be beneficial. Follow-up again in 3 weeks.

## 2016-07-05 NOTE — Patient Instructions (Addendum)
We injected the finger today  I am here if I can help Hydroxyzine up to 3 times a day to help with anything. Try it at home first  Continue the vitamin D Turmeric 535m daily can help See me again in 3 weeks if I can help or at least send me a message.

## 2016-07-05 NOTE — Progress Notes (Signed)
[name] [place], [place] [address] Phone: [phone] Subjective:    I'm seeing this patient by the request  of:  TODD,JEFFREY ALLEN, MD   CC: Left finger pain  [id number]  Welford Christmas is a 45 y.o. male coming in with complaint of left index trigger pain. Feels like he is having another synovitis. Past medical history significant for also colitis and has difficulty overall. Has had this before. Worsening pain that does not allow him to do daily activities. Patient states the swelling seems to be intermittent. Pain is severe enough that can wake him up at night as well. No injury. No numbness..    some increasing anxiety recently. Patient is having difficulty in his personal life. Has been taking then nightly. Sees a psychologist. Has good support system. Denies any suicidal or homicidal ideation. Liver something to help though so we can be more active during the day.  Past Medical History:  Diagnosis Date  . Allergy   . Arthritis   . Arthropathy    and scroilitis  . Asthma   . Blood transfusion without reported diagnosis   . Chronic drug-induced interstitial lung disorders ([place])   . Crohn's ileocolitis ([place]) 08/29/2008  . Dysplastic nevi   . GERD (gastroesophageal reflux disease)   . Herniated disc   . History of proctitis 2006  . IBD (inflammatory bowel disease)   . Incisional hernia   . Migraine headache   . Morton's neuroma of right foot   . Osteopenia   . 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 Small bowel obstruction due to adhesions 02/09/2015  . Thyroid disease   . Vitamin D deficiency    Past Surgical History:  Procedure Laterality Date  . COLECTOMY    . COLONOSCOPY W/ BIOPSIES  04/16/2006   crohn's colitis  . FLEXIBLE SIGMOIDOSCOPY  2006 - 02/2014   ileocolitis, pouchitis.  with pouch ulcer.   . FUNCTIONAL ENDOSCOPIC SINUS SURGERY  12/2003   partial ethmoidectomy.  Dr Wilburn Cornelia  . ileoanal pull-through    . LUMBAR  LAMINECTOMY/DECOMPRESSION MICRODISCECTOMY Right 10/13/2015   Procedure: Right Lumbar four- five Microdiskectomy;  Surgeon: Erline Levine, MD;  Location: [facility];  Service: Neurosurgery;  Laterality: Right;  Right L4-5 Microdiskectomy  . sacroilitis    . SIGMOIDOSCOPY  01/2005  . VENTRAL HERNIA REPAIR  06/2014   Social History   Social History  . Marital status: Married    Spouse name: N/A  . Number of children: 2  . Years of education: N/A   Occupational History  . [employer]   Social History Main Topics  . Smoking status: Never Smoker  . Smokeless tobacco: Never Used  . Alcohol use 8.4 oz/week    14 Glasses of wine per week     Comment: couple of drinks daily  . Drug use: No  . Sexual activity: Yes   Other Topics Concern  . None   Social History Narrative   Father a Technical brewer in [place].   Married to Dr. Honor Junes daughter.   Wife is CRNA   Allergies  Allergen Reactions  . Mercaptopurine Nausea And Vomiting    Felt ill within 2 days of initiating therapy.   Family History  Problem Relation Age of Onset  . Ulcerative colitis Mother   . Colon cancer      grandmother    Past medical history, social, surgical and family history all reviewed in electronic medical record.  No  pertanent information unless stated regarding to the chief complaint.   Review of Systems: No headache, visual changes, nausea, vomiting, diarrhea, constipation, dizziness, abdominal pain, skin rash, fevers, chills, night sweats, weight loss, swollen lymph nodes, body aches, joint swelling, muscle aches, chest pain, shortness of breath, mood changes.   Objective  Blood pressure 130/78, height 5' 9.25" (1.759 m), weight 158 lb (71.7 kg), SpO2 98 %.  Systems examined below as of 07/05/16 General: NAD A&O x3 mood, affect normal  HEENT: Pupils equal, extraocular movements intact no nystagmus Respiratory: not short of breath at rest or with speaking Cardiovascular: No  lower extremity edema, non tender Skin: Warm dry intact with no signs of infection or rash on extremities or on axial skeleton. Abdomen: Soft nontender, no masses Neuro: Cranial nerves  intact, neurovascularly intact in all extremities with 2+ DTRs and 2+ pulses. Lymph: No lymphadenopathy appreciated today  Gait normal with good balance and coordination.  MSK: Non tender with full range of motion and good stability and symmetric strength and tone of shoulders, elbows, wrist,  knee hips and ankles bilaterally.   Left hand exam shows the patient does have what appears to be swelling of the MP of the first index finger. Tender to palpation. Nearly full range of motion. Neurovascularly intact distally.  PProcedure: Real-time Ultrasound Guided Injection of right first MP joint Device: GE Logiq E  Ultrasound guided injection is preferred based studies that show increased duration, increased effect, greater accuracy, decreased procedural pain, increased response rate, and decreased cost with ultrasound guided versus blind injection.  Verbal informed consent obtained.  Time-out conducted.  Noted no overlying erythema, induration, or other signs of local infection.  Skin prepped in a sterile fashion.  Local anesthesia: Topical Ethyl chloride.  With sterile technique and under real time ultrasound guidance:  With a 25-gauge half-inch needle patient was injected with a total of 0.5 mL of 0.5% Marcaine and 0.5 mL of Kenalog 40 mg/dL. Completed without difficulty  Pain immediately resolved suggesting accurate placement of the medication.  Advised to call if fevers/chills, erythema, induration, drainage, or persistent bleeding.  Images permanently stored and available for review in the ultrasound unit.  Impression: Technically successful ultrasound guided injection.   Impression and Recommendations:     This case required medical decision making of moderate complexity.      Note: This dictation  was prepared with Dragon dictation along with smaller phrase technology. Any transcriptional errors that result from this process are unintentional.

## 2016-07-05 NOTE — Assessment & Plan Note (Signed)
Patient given injection today and tolerated the procedure well. Discussed icing regimen and home exercises, we discussed objective is doing which ones to avoid. Continue the vitamin D supplementation.

## 2016-07-18 NOTE — Progress Notes (Signed)
[name] [address] [place], [place] [address] Phone: [phone] Subjective:     CC: Left finger pain f/u   [id number]  Charles [name] is a 45 y.o. male coming in with complaint of left index trigger pain.Patient was given an injection previously. Patient was doing much better. Patient states States not having any pain at this time.   some increasing anxiety recently. P patient has had difficulties still in his personal life. Patient is going to try hydroxyzine. Patient states He continues to have some anxiety but feels like he is doing somewhat better. Patient is not taking hydroxyzine on a regular basis. Feels overall. Still difficult with patient having the holidays. States that he can unfortunate laying irritates can bring up some motions.  Patient is having some mild increase in low back pain. Has been swelling more. No radiation the pain. Patient describes it as a dull, throbbing aching sensation. Patient has responded well to osteopathic manipulative the past.  Past Medical History:  Diagnosis Date  . Allergy   . Arthritis   . Arthropathy    and scroilitis  . Asthma   . Blood transfusion without reported diagnosis   . 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 Crohn's ileocolitis ([place]) 08/29/2008  . Dysplastic nevi   . GERD (gastroesophageal reflux disease)   . Herniated disc   . History of proctitis 2006  . IBD (inflammatory bowel disease)   . Incisional hernia   . Migraine headache   . Morton's neuroma of right foot   . Osteopenia   . 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 Small bowel obstruction due to adhesions 02/09/2015  . Thyroid disease   . Vitamin D deficiency    Past Surgical History:  Procedure Laterality Date  . COLECTOMY    . COLONOSCOPY W/ BIOPSIES  04/16/2006   crohn's colitis  . FLEXIBLE SIGMOIDOSCOPY  2006 - 02/2014   ileocolitis, pouchitis.  with pouch ulcer.   . FUNCTIONAL ENDOSCOPIC SINUS SURGERY  12/2003   partial ethmoidectomy.  Dr Wilburn Cornelia  . ileoanal pull-through    . LUMBAR LAMINECTOMY/DECOMPRESSION MICRODISCECTOMY Right 10/13/2015   Procedure: Right Lumbar four- five Microdiskectomy;  Surgeon: Erline Levine, MD;  Location: [facility];  Service: Neurosurgery;  Laterality: Right;  Right L4-5 Microdiskectomy  . sacroilitis    . SIGMOIDOSCOPY  01/2005  . VENTRAL HERNIA REPAIR  06/2014   Social History   Social History  . Marital status: Married    Spouse name: N/A  . Number of children: 2  . Years of education: N/A   Occupational History  . [employer] rep [employer]   Social History Main Topics  . Smoking status: Never Smoker  . Smokeless tobacco: Never Used  . Alcohol use 8.4 oz/week    14 Glasses of wine per week     Comment: couple of drinks daily  . Drug use: No  . Sexual activity: Yes   Other Topics Concern  . None   Social History Narrative   Father a Technical brewer in [place].   Married to Dr. Honor Junes daughter.   Wife is CRNA   Allergies  Allergen Reactions  . Mercaptopurine Nausea And Vomiting    Felt ill within 2 days of initiating therapy.   Family History  Problem Relation Age of Onset  . Ulcerative colitis Mother   . Colon cancer      grandmother    Past medical history, social, surgical and family history all  reviewed in electronic medical record.  No pertanent information unless stated regarding to the chief complaint.   Review of Systems: No headache, visual changes, nausea, vomiting, diarrhea, constipation, dizziness, abdominal pain, skin rash, fevers, chills, night sweats, weight loss, swollen lymph nodes, body aches, joint swelling, muscle aches, chest pain, shortness of breath, mood changes.   Objective  Blood pressure 140/80, pulse 78, height 5' 10"  (1.778 m), weight 155 lb 9.6 oz (70.6 kg).  Systems examined below as of 07/19/16 General: NAD A&O x3 mood, affect normal  HEENT: Pupils equal, extraocular movements intact no  nystagmus Respiratory: not short of breath at rest or with speaking Cardiovascular: No lower extremity edema, non tender Skin: Warm dry intact with no signs of infection or rash on extremities or on axial skeleton. Abdomen: Soft nontender, no masses Neuro: Cranial nerves  intact, neurovascularly intact in all extremities with 2+ DTRs and 2+ pulses. Lymph: No lymphadenopathy appreciated today  Gait normal with good balance and coordination.  MSK: Non tender with full range of motion and good stability and symmetric strength and tone of shoulders, elbows, wrist,  knee hips and ankles bilaterally.    Back Exam:  Inspection: Unremarkable  Motion: Flexion 45 deg, Extension30 deg, Side Bending to 45 deg bilaterally,  Rotation to 45 deg bilaterally  SLR laying: Negative  XSLR laying: Negative  Palpable tenderness: Mild tenderness in the pairs, suture of the lumbar spine. FABER: Mild tightness of the left side. Sensory change: Gross sensation intact to all lumbar and sacral dermatomes.  Reflexes: 2+ at both patellar tendons, 2+ at achilles tendons, Babinski's downgoing.  Strength at foot  Plantar-flexion: 5/5 Dorsi-flexion: 5/5 Eversion: 5/5 Inversion: 5/5  Leg strength  Quad: 5/5 Hamstring: 5/5 Hip flexor: 5/5 Hip abductors: 4/5 but symmetric Gait unremarkable.  Osteopathic findings T3 extended rotated and side bent right L2 flexed rotated and side bent right Sacrum left on left    Impression and Recommendations:     This case required medical decision making of moderate complexity.      Note: This dictation was prepared with Dragon dictation along with smaller phrase technology. Any transcriptional errors that result from this process are unintentional.

## 2016-07-19 ENCOUNTER — Ambulatory Visit (INDEPENDENT_AMBULATORY_CARE_PROVIDER_SITE_OTHER): Payer: 59 | Admitting: Family Medicine

## 2016-07-19 ENCOUNTER — Encounter: Payer: Self-pay | Admitting: Family Medicine

## 2016-07-19 VITALS — BP 140/80 | HR 78 | Ht 70.0 in | Wt 155.6 lb

## 2016-07-19 DIAGNOSIS — M999 Biomechanical lesion, unspecified: Secondary | ICD-10-CM

## 2016-07-19 DIAGNOSIS — M461 Sacroiliitis, not elsewhere classified: Secondary | ICD-10-CM | POA: Diagnosis not present

## 2016-07-19 DIAGNOSIS — M5126 Other intervertebral disc displacement, lumbar region: Secondary | ICD-10-CM

## 2016-07-19 NOTE — Patient Instructions (Signed)
Good to see you  I am glad you are doing better.  Keep swimming Get out and hike See you soon.

## 2016-07-19 NOTE — Assessment & Plan Note (Signed)
Mild increase in muscle tightness resuming. I do not believe the patient is having a repeat of any herniated disc. We discussed with patient about continuing the vitamin D supplementation, core exercises. Patient is swimming and seems to be doing well. Respond well to manipulation. Follow-up again in 4-6 weeks.

## 2016-07-19 NOTE — Assessment & Plan Note (Signed)
Stable. We'll continue to monitor.

## 2016-07-19 NOTE — Assessment & Plan Note (Signed)
Decision today to treat with OMT was based on Physical Exam  After verbal consent patient was treated with HVLA, ME techniques in thoracici, rib and lumbar areas  Patient tolerated the procedure well with improvement in symptoms  Patient given exercises, stretches and lifestyle modifications  See medications in patient instructions if given  Patient will follow up in 4-6 weeks

## 2016-07-26 ENCOUNTER — Encounter: Payer: Self-pay | Admitting: Internal Medicine

## 2016-07-26 MED ORDER — DEXLANSOPRAZOLE 30 MG PO CPDR: 30.0000 mg | DELAYED_RELEASE_CAPSULE | Freq: Every day | ORAL | 3 refills | Status: DC

## 2016-08-05 ENCOUNTER — Other Ambulatory Visit: Payer: Self-pay | Admitting: Family Medicine

## 2016-08-05 ENCOUNTER — Other Ambulatory Visit: Payer: Self-pay | Admitting: Internal Medicine

## 2016-08-05 NOTE — Telephone Encounter (Signed)
Refill done.  

## 2016-08-13 ENCOUNTER — Other Ambulatory Visit: Payer: Self-pay

## 2016-08-13 MED ORDER — DEXLANSOPRAZOLE 30 MG PO CPDR: 30.0000 mg | DELAYED_RELEASE_CAPSULE | Freq: Every day | ORAL | 3 refills | Status: DC

## 2016-08-23 ENCOUNTER — Ambulatory Visit (INDEPENDENT_AMBULATORY_CARE_PROVIDER_SITE_OTHER): Payer: 59 | Admitting: Family Medicine

## 2016-08-23 ENCOUNTER — Encounter: Payer: Self-pay | Admitting: Family Medicine

## 2016-08-23 VITALS — BP 132/84 | HR 84 | Ht 70.0 in | Wt 152.0 lb

## 2016-08-23 DIAGNOSIS — M5417 Radiculopathy, lumbosacral region: Secondary | ICD-10-CM | POA: Diagnosis not present

## 2016-08-23 DIAGNOSIS — M999 Biomechanical lesion, unspecified: Secondary | ICD-10-CM

## 2016-08-23 DIAGNOSIS — M5416 Radiculopathy, lumbar region: Secondary | ICD-10-CM

## 2016-08-23 NOTE — Assessment & Plan Note (Signed)
Decision today to treat with OMT was based on Physical Exam  After verbal consent patient was treated with HVLA, ME techniques in thoracici, rib and lumbar areas  Patient tolerated the procedure well with improvement in symptoms  Patient given exercises, stretches and lifestyle modifications  See medications in patient instructions if given  Patient will follow up in 4-8 weeks

## 2016-08-23 NOTE — Patient Instructions (Addendum)
God to see you  Happy New Year!  Stay active.  I am hoping this will be a good year for you.  See me again in 6 weeks

## 2016-08-23 NOTE — Progress Notes (Signed)
[name] [address] [place], [place] [address] Phone: [phone] Subjective:     CC:  Anxiety low back pain.  [id number]  Charles [name] is a 46 y.o. male coming in with complaint of Anxiety. Patient is going to significant life changes recently. Did recently have the operation. Patient has been doing very well with help from his support system. Does take hydroxyzine intermittently. Feels that that has been helpful. Is making some strides he feels.  Mild worsening low back pain. Has had this chronically. Once to start working on a more regular basis. Wants to start running again. Feels like he needs to do something more for himself. Patient though is having increasing stiffness. No radicular symptoms. Did have a microdiscectomy last year.     Past Medical History:  Diagnosis Date  . Allergy   . Arthritis   . Arthropathy    and scroilitis  . Asthma   . Blood transfusion without reported diagnosis   . Chronic drug-induced interstitial lung disorders ([facility])   . Crohn's ileocolitis ([facility]) 08/29/2008  . Dysplastic nevi   . GERD (gastroesophageal reflux disease)   . Herniated disc   . History of proctitis 2006  . IBD (inflammatory bowel disease)   . Incisional hernia   . Migraine headache   . Morton's neuroma of right foot   . Osteopenia   . Pouchitis ([facility]) 2006  . Small bowel obstruction due to adhesions 02/09/2015  . Thyroid disease   . Vitamin D deficiency    Past Surgical History:  Procedure Laterality Date  . COLECTOMY    . COLONOSCOPY W/ BIOPSIES  04/16/2006   crohn's colitis  . FLEXIBLE SIGMOIDOSCOPY  2006 - 02/2014   ileocolitis, pouchitis.  with pouch ulcer.   . FUNCTIONAL ENDOSCOPIC SINUS SURGERY  12/2003   partial ethmoidectomy.  Dr Wilburn Cornelia  . ileoanal pull-through    . LUMBAR LAMINECTOMY/DECOMPRESSION MICRODISCECTOMY Right 10/13/2015   Procedure: Right Lumbar four- five Microdiskectomy;  Surgeon: Erline Levine, MD;  Location: [facility];  Service: Neurosurgery;  Laterality: Right;  Right L4-5 Microdiskectomy  . sacroilitis    . SIGMOIDOSCOPY  01/2005  . VENTRAL HERNIA REPAIR  06/2014   Social History   Social History  . Marital status: Married    Spouse name: N/A  . Number of children: 2  . Years of education: N/A   Occupational History  . [employer]   Social History Main Topics  . Smoking status: Never Smoker  . Smokeless tobacco: Never Used  . Alcohol use 8.4 oz/week    14 Glasses of wine per week     Comment: couple of drinks daily  . Drug use: No  . Sexual activity: Yes   Other Topics Concern  . None   Social History Narrative   Father a Technical brewer in [place].   Married to Dr. Honor Junes daughter.   Wife is CRNA   Allergies  Allergen Reactions  . Mercaptopurine Nausea And Vomiting    Felt ill within 2 days of initiating therapy.   Family History  Problem Relation Age of Onset  . Ulcerative colitis Mother   . Colon cancer      grandmother    Past medical history, social, surgical and family history all reviewed in electronic medical record.  No pertanent information unless stated regarding to the chief complaint.   Review of Systems:Review of systems updated and as accurate as of 08/23/16  No headache, visual  changes, nausea, vomiting, diarrhea, constipation, dizziness, abdominal pain, skin rash, fevers, chills, night sweats, weight loss, swollen lymph nodes chest pain, shortness of breath, mood changes.   Objective  Blood pressure 132/84, pulse 84, height 5' 10"  (1.778 m), weight 152 lb (68.9 kg), SpO2 99 %. Systems examined below as of 08/23/16   General: No apparent distress alert and oriented x3 mood and affect normal, dressed appropriately.  HEENT: Pupils equal, extraocular movements intact  Respiratory: Patient's speak in full sentences and does not appear short of breath  Cardiovascular: No lower extremity edema, non tender, no erythema  Skin:  Warm dry intact with no signs of infection or rash on extremities or on axial skeleton.  Abdomen: Soft nontender  Neuro: Cranial nerves II through XII are intact, neurovascularly intact in all extremities with 2+ DTRs and 2+ pulses.  Lymph: No lymphadenopathy of posterior or anterior cervical chain or axillae bilaterally.  Gait normal with good balance and coordination.  MSK:  Non tender with full range of motion and good stability and symmetric strength and tone of shoulders, elbows, wrist, hip, knee and ankles bilaterally.   Back Exam:  Inspection: Unremarkable  Motion: Flexion 35 deg, Extension 25 deg, Side Bending to 35 deg bilaterally,  Rotation to 35 deg bilaterally  SLR laying: Negative  XSLR laying: Negative  Palpable tenderness: Tender to palpation of the Pierce, suture of the lumbar spine bilaterally. Seems to be diffusely. No spinous process tenderness. FABER: negative. Sensory change: Gross sensation intact to all lumbar and sacral dermatomes.  Reflexes: 2+ at both patellar tendons, 2+ at achilles tendons, Babinski's downgoing.  Strength at foot  Plantar-flexion: 5/5 Dorsi-flexion: 5/5 Eversion: 5/5 Inversion: 5/5  Leg strength  Quad: 5/5 Hamstring: 5/5 Hip flexor: 5/5 Hip abductors: 5/5  Gait unremarkable.   Osteopathic findings Cervical C2 flexed rotated and side bent right C4 flexed rotated and side bent left C6 flexed rotated and side bent left T3 extended rotated and side bent right inhaled third rib T9 extended rotated and side bent left L2 flexed rotated and side bent right L4 flexed rotated and side bent left Sacrum right on right    Impression and Recommendations:     This case required medical decision making of moderate complexity.      Note: This dictation was prepared with Dragon dictation along with smaller phrase technology. Any transcriptional errors that result from this process are unintentional.

## 2016-08-23 NOTE — Assessment & Plan Note (Signed)
Back pain seems to be

## 2016-08-23 NOTE — Assessment & Plan Note (Signed)
Patient doing well at this time with no radicular symptoms. We'll continue to monitor. Respond fairly well to a supine manipulation. Follow-up in 4-8 week intervals. Continue conservative therapy.

## 2016-09-21 NOTE — Progress Notes (Signed)
[name] [address] [place], [place] [address] Phone: [phone] Subjective:     CC:  Anxiety low back pain.  [id number]  Charles [name] is a 46 y.o. male coming in with complaint of Anxiety.We did place patient on the hydroxyzine. Patient seemed to be doing relatively well. Patient states He seems to be doing relatively well. Has decreased the amount of Ativan he is taking is only taking the hydroxyzine as needed. Very happy so far at this moment with the results. Still taking a day at a time.  Mild worsening low back pain. Has had this chronically. Patient states that he had been doing very well. Is having somewhat of a Crohn's flare recently. States that this could be concerning to some of his aches and pains. Patient states that he is also traveled recently. Has not been doing great on his diet. Things like that that seems to have exacerbated some of the discomfort.     Past Medical History:  Diagnosis Date  . Allergy   . Arthritis   . Arthropathy    and scroilitis  . Asthma   . Blood transfusion without reported diagnosis   . Chronic drug-induced interstitial lung disorders ([facility])   . Crohn's ileocolitis ([facility]) 08/29/2008  . Dysplastic nevi   . GERD (gastroesophageal reflux disease)   . Herniated disc   . History of proctitis 2006  . IBD (inflammatory bowel disease)   . Incisional hernia   . Migraine headache   . Morton's neuroma of right foot   . Osteopenia   . Pouchitis ([facility]) 2006  . Small bowel obstruction due to adhesions 02/09/2015  . Thyroid disease   . Vitamin D deficiency    Past Surgical History:  Procedure Laterality Date  . COLECTOMY    . COLONOSCOPY W/ BIOPSIES  04/16/2006   crohn's colitis  . FLEXIBLE SIGMOIDOSCOPY  2006 - 02/2014   ileocolitis, pouchitis.  with pouch ulcer.   . FUNCTIONAL ENDOSCOPIC SINUS SURGERY  12/2003   partial ethmoidectomy.  Dr Wilburn Cornelia  . ileoanal pull-through    . LUMBAR  LAMINECTOMY/DECOMPRESSION MICRODISCECTOMY Right 10/13/2015   Procedure: Right Lumbar four- five Microdiskectomy;  Surgeon: Erline Levine, MD;  Location: [facility];  Service: Neurosurgery;  Laterality: Right;  Right L4-5 Microdiskectomy  . sacroilitis    . SIGMOIDOSCOPY  01/2005  . VENTRAL HERNIA REPAIR  06/2014   Social History   Social History  . Marital status: Married    Spouse name: N/A  . Number of children: 2  . Years of education: N/A   Occupational History  . [employer]   Social History Main Topics  . Smoking status: Never Smoker  . Smokeless tobacco: Never Used  . Alcohol use 8.4 oz/week    14 Glasses of wine per week     Comment: couple of drinks daily  . Drug use: No  . Sexual activity: Yes   Other Topics Concern  . None   Social History Narrative   Father a Technical brewer in [place].   Married to Dr. Honor Junes daughter.   Wife is CRNA   Allergies  Allergen Reactions  . Mercaptopurine Nausea And Vomiting    Felt ill within 2 days of initiating therapy.   Family History  Problem Relation Age of Onset  . Ulcerative colitis Mother   . Colon cancer      grandmother    Past medical history, social, surgical and family history all reviewed  in electronic medical record.  No pertanent information unless stated regarding to the chief complaint.   Review of Systems: No headache, visual changes, nausea, vomiting, diarrhea, constipation, dizziness, abdominal pain, skin rash, fevers, chills, night sweats, weight loss, swollen lymph nodes, body aches, joint swelling, muscle aches, chest pain, shortness of breath, mood changes.    Objective  Blood pressure 134/80, pulse 77, height 5' 10"  (1.778 m), weight 157 lb (71.2 kg), SpO2 99 %.   Systems examined below as of 09/23/16 General: NAD A&O x3 mood, affect normal  HEENT: Pupils equal, extraocular movements intact no nystagmus Respiratory: not short of breath at rest or with  speaking Cardiovascular: No lower extremity edema, non tender Skin: Warm dry intact with no signs of infection or rash on extremities or on axial skeleton. Abdomen: Soft nontender, no masses Neuro: Cranial nerves  intact, neurovascularly intact in all extremities with 2+ DTRs and 2+ pulses. Lymph: No lymphadenopathy appreciated today  Gait normal with good balance and coordination.  MSK: Non tender with full range of motion and good stability and symmetric strength and tone of shoulders, elbows, wrist,  knee hips and ankles bilaterally.    Back Exam:  Inspection: Unremarkable  Motion: Flexion 45 deg, Extension 45 deg, Side Bending to 45 deg bilaterally,  Rotation to 45 deg bilaterally  SLR laying: Negative  XSLR laying: Negative  Palpable tenderness: None. FABER: negative. Sensory change: Gross sensation intact to all lumbar and sacral dermatomes.  Reflexes: 2+ at both patellar tendons, 2+ at achilles tendons, Babinski's downgoing.  Strength at foot  Plantar-flexion: 5/5 Dorsi-flexion: 5/5 Eversion: 5/5 Inversion: 5/5  Leg strength  Quad: 5/5 Hamstring: 5/5 Hip flexor: 5/5 Hip abductors: 5/5  Gait unremarkable.    Osteopathic findings Cervical C2 flexed rotated and side bent right C6 flexed rotated and side bent left T3 extended rotated and side bent right inhaled third rib T9 extended rotated and side bent left L3 flexed rotated and side bent right Sacrum right on right     Impression and Recommendations:     This case required medical decision making of moderate complexity.      Note: This dictation was prepared with Dragon dictation along with smaller phrase technology. Any transcriptional errors that result from this process are unintentional.

## 2016-09-23 ENCOUNTER — Encounter: Payer: Self-pay | Admitting: Family Medicine

## 2016-09-23 ENCOUNTER — Ambulatory Visit (INDEPENDENT_AMBULATORY_CARE_PROVIDER_SITE_OTHER): Payer: 59 | Admitting: Family Medicine

## 2016-09-23 VITALS — BP 134/80 | HR 77 | Ht 70.0 in | Wt 157.0 lb

## 2016-09-23 DIAGNOSIS — M999 Biomechanical lesion, unspecified: Secondary | ICD-10-CM | POA: Diagnosis not present

## 2016-09-23 DIAGNOSIS — M461 Sacroiliitis, not elsewhere classified: Secondary | ICD-10-CM

## 2016-09-23 NOTE — Patient Instructions (Addendum)
Great to see you  Watch for a flare.  Keep being active.  Call me if you need anything Otherwise 3-4 weeks.

## 2016-09-23 NOTE — Assessment & Plan Note (Signed)
Decision today to treat with OMT was based on Physical Exam  After verbal consent patient was treated with HVLA, ME, FPR techniques in cervical, thoracic, rib lumbar and sacral areas  Patient tolerated the procedure well with improvement in symptoms  Patient given exercises, stretches and lifestyle modifications  See medications in patient instructions if given  Patient will follow up in 3-4 weeks

## 2016-09-23 NOTE — Assessment & Plan Note (Signed)
I believe the patient is having more of a sacroiliitis. We discussed icing regimen and home exercises. We discussed which activities doing which was to potentially avoid. Patient will be progressive and increasing his activity. Discuss Celebrex for possible breakthrough medication. We'll monitor closely. Follow-up again in 3-4 weeks.

## 2016-09-26 ENCOUNTER — Ambulatory Visit (INDEPENDENT_AMBULATORY_CARE_PROVIDER_SITE_OTHER): Payer: 59 | Admitting: Psychology

## 2016-09-26 DIAGNOSIS — F4323 Adjustment disorder with mixed anxiety and depressed mood: Secondary | ICD-10-CM | POA: Diagnosis not present

## 2016-09-30 DIAGNOSIS — D225 Melanocytic nevi of trunk: Secondary | ICD-10-CM | POA: Diagnosis not present

## 2016-09-30 DIAGNOSIS — D2261 Melanocytic nevi of right upper limb, including shoulder: Secondary | ICD-10-CM | POA: Diagnosis not present

## 2016-09-30 DIAGNOSIS — D2262 Melanocytic nevi of left upper limb, including shoulder: Secondary | ICD-10-CM | POA: Diagnosis not present

## 2016-10-15 ENCOUNTER — Ambulatory Visit: Payer: 59 | Admitting: Psychology

## 2016-10-19 NOTE — Progress Notes (Signed)
[name] [address] [place], [place] [address] Phone: [phone] Subjective:     CC:  Anxiety low back pain f/u  [id number]  Jarad Barth is a 46 y.o. male coming in with complaint of Anxiety.Continues to improve. Patient continued to see a psychiatrist. Patient continues to use the hydroxyzine and cemented then. Not doing any chronic pain medications. No chronic antidepressant medicine either.  Chronic back pain seems to be doing better as well. Patient states that since his last full layer of his Crohn's disease has been remarkably stable. Minimal radicular symptoms whatsoever. Patient has had annual exam with neurosurgeon and was given full bill of health. Has increase activity including golf and running recently and still seems to be doing relatively well. Tightness noted more in the thoracolumbar area that seems to be worse.     Past Medical History:  Diagnosis Date  . Allergy   . Arthritis   . Arthropathy    and scroilitis  . Asthma   . Blood transfusion without reported diagnosis   . Chronic drug-induced interstitial lung disorders ([facility])   . Crohn's ileocolitis ([facility]) 08/29/2008  . Dysplastic nevi   . GERD (gastroesophageal reflux disease)   . Herniated disc   . History of proctitis 2006  . IBD (inflammatory bowel disease)   . Incisional hernia   . Migraine headache   . Morton's neuroma of right foot   . Osteopenia   . Pouchitis ([facility]) 2006  . Small bowel obstruction due to adhesions 02/09/2015  . Thyroid disease   . Vitamin D deficiency    Past Surgical History:  Procedure Laterality Date  . COLECTOMY    . COLONOSCOPY W/ BIOPSIES  04/16/2006   crohn's colitis  . FLEXIBLE SIGMOIDOSCOPY  2006 - 02/2014   ileocolitis, pouchitis.  with pouch ulcer.   . FUNCTIONAL ENDOSCOPIC SINUS SURGERY  12/2003   partial ethmoidectomy.  Dr Wilburn Cornelia  . ileoanal pull-through    . LUMBAR LAMINECTOMY/DECOMPRESSION MICRODISCECTOMY Right 10/13/2015   Procedure: Right Lumbar four- five Microdiskectomy;  Surgeon: Erline Levine, MD;  Location: [facility];  Service: Neurosurgery;  Laterality: Right;  Right L4-5 Microdiskectomy  . sacroilitis    . SIGMOIDOSCOPY  01/2005  . VENTRAL HERNIA REPAIR  06/2014   Social History   Social History  . Marital status: Married    Spouse name: N/A  . Number of children: 2  . Years of education: N/A   Occupational History  . [employer] rep [employer]   Social History Main Topics  . Smoking status: Never Smoker  . Smokeless tobacco: Never Used  . Alcohol use 8.4 oz/week    14 Glasses of wine per week     Comment: couple of drinks daily  . Drug use: No  . Sexual activity: Yes   Other Topics Concern  . None   Social History Narrative   Father a Technical brewer in [place].   Married to Dr. Honor Junes daughter.   Wife is CRNA   Allergies  Allergen Reactions  . Mercaptopurine Nausea And Vomiting    Felt ill within 2 days of initiating therapy.   Family History  Problem Relation Age of Onset  . Ulcerative colitis Mother   . Colon cancer      grandmother    Past medical history, social, surgical and family history all reviewed in electronic medical record.  No pertanent information unless stated regarding to the chief complaint.   Review of Systems: No headache,  visual changes, nausea, vomiting,  dizziness, abdominal pain, skin rash, fevers, chills, night sweats, weight loss, swollen lymph nodes, body aches, joint swelling, muscle aches, chest pain, shortness of breath, mood changes.  Positive diarrhea and constipation with patient's Crohn's   Objective  Blood pressure 136/80, height 5' 10"  (1.778 m), weight 154 lb (69.9 kg).   Systems examined below as of 10/21/16 General: NAD A&O x3 mood, affect normal  HEENT: Pupils equal, extraocular movements intact no nystagmus Respiratory: not short of breath at rest or with speaking Cardiovascular: No lower extremity edema, non  tender Skin: Warm dry intact with no signs of infection or rash on extremities or on axial skeleton. Abdomen: Soft nontender, no masses Neuro: Cranial nerves  intact, neurovascularly intact in all extremities with 2+ DTRs and 2+ pulses. Lymph: No lymphadenopathy appreciated today  Gait normal with good balance and coordination.  MSK: tender with full range of motion and good stability and symmetric strength and tone of shoulders, elbows, wrist,  knee hips and ankles bilaterally.    Back Exam:  Inspection: Unremarkable  Motion: Flexion 45 deg, Extension 25 deg, Side Bending to 45 deg bilaterally,  Rotation to 45 deg bilaterally  SLR laying: Negative  XSLR laying: Negative  Palpable tenderness: Increasing tenderness to palpation of the paraspinal musculature at the thoracolumbar juncture as well as emptying lumbosacral juncture. FABER: negative. Sensory change: Gross sensation intact to all lumbar and sacral dermatomes.  Reflexes: 2+ at both patellar tendons, 2+ at achilles tendons, Babinski's downgoing.  Strength at foot  Symmetric and full bilaterally  Osteopathic findings  C5 flexed rotated and side bent left  T3 extended rotated and side bent right inhaled third rib T5 extended rotated and side bent left L5 flexed rotated and side bent right Sacrum right on right        Impression and Recommendations:     This case required medical decision making of moderate complexity.      Note: This dictation was prepared with Dragon dictation along with smaller phrase technology. Any transcriptional errors that result from this process are unintentional.

## 2016-10-21 ENCOUNTER — Ambulatory Visit (INDEPENDENT_AMBULATORY_CARE_PROVIDER_SITE_OTHER): Payer: 59 | Admitting: Family Medicine

## 2016-10-21 ENCOUNTER — Encounter: Payer: Self-pay | Admitting: Family Medicine

## 2016-10-21 VITALS — BP 136/80 | Ht 70.0 in | Wt 154.0 lb

## 2016-10-21 DIAGNOSIS — M5126 Other intervertebral disc displacement, lumbar region: Secondary | ICD-10-CM | POA: Diagnosis not present

## 2016-10-21 DIAGNOSIS — M5416 Radiculopathy, lumbar region: Secondary | ICD-10-CM | POA: Diagnosis not present

## 2016-10-21 DIAGNOSIS — M461 Sacroiliitis, not elsewhere classified: Secondary | ICD-10-CM | POA: Diagnosis not present

## 2016-10-21 DIAGNOSIS — M999 Biomechanical lesion, unspecified: Secondary | ICD-10-CM

## 2016-10-21 DIAGNOSIS — M545 Low back pain: Secondary | ICD-10-CM | POA: Diagnosis not present

## 2016-10-21 NOTE — Assessment & Plan Note (Signed)
Decision today to treat with OMT was based on Physical Exam  After verbal consent patient was treated with HVLA, ME, FPR techniques in cervical, thoracic, rib lumbar and sacral areas  Patient tolerated the procedure well with improvement in symptoms  Patient given exercises, stretches and lifestyle modifications  See medications in patient instructions if given  Patient will follow up in 6 weeks 

## 2016-10-21 NOTE — Assessment & Plan Note (Signed)
Continues respond fairly well to conservative therapy. We discussed icing regimen and home exercises. We discussed which activities patient is to increase activity as tolerated. Patient will come back and see me again in 6 weeks. Sleeping that seemed to be worse was mild increasing tightness of the lower back especially with extension.

## 2016-10-21 NOTE — Patient Instructions (Signed)
Good to see you  Keep it up  Keep running Watch the smart phone See me again in 5-6 weeks.

## 2016-10-25 DIAGNOSIS — L0293 Carbuncle, unspecified: Secondary | ICD-10-CM | POA: Diagnosis not present

## 2016-10-25 DIAGNOSIS — L02234 Carbuncle of groin: Secondary | ICD-10-CM | POA: Diagnosis not present

## 2016-11-25 ENCOUNTER — Other Ambulatory Visit: Payer: Self-pay | Admitting: Internal Medicine

## 2017-01-02 ENCOUNTER — Encounter: Payer: Self-pay | Admitting: Internal Medicine

## 2017-01-05 ENCOUNTER — Other Ambulatory Visit: Payer: Self-pay | Admitting: Family Medicine

## 2017-01-10 ENCOUNTER — Other Ambulatory Visit: Payer: Self-pay

## 2017-01-10 ENCOUNTER — Telehealth: Payer: Self-pay

## 2017-01-10 MED ORDER — LANSOPRAZOLE 30 MG PO CPDR: 30.0000 mg | DELAYED_RELEASE_CAPSULE | Freq: Two times a day (BID) | ORAL | 3 refills | Status: DC

## 2017-01-10 NOTE — Telephone Encounter (Signed)
RX sent as directed. mychart msg sent to inform patient.

## 2017-01-10 NOTE — Telephone Encounter (Signed)
We should try lansoprazole ( Prevacid) 30 mg bid before breakfast and supper # [id number] RF

## 2017-01-10 NOTE — Telephone Encounter (Signed)
Dr Gordy Clement you are well. I'm writing about my GERD medicine. I recently went to refill Dexilant (90 day supply) and they quoted me $750! With copay card it came down to $550, but still that's a load. I am having reflux/asthma symptoms so I'm concerned about not taking anything. In fact, I've gone back up from 30 to 56ms trying to get things to settle down.  I know the expense is due to deductible, but 2 options a) perhaps I use my 2x30 day fills to reduce upfront costs b) [facility] suggested the following lower costs alternatives.  AcipHex, Nexium, Prevacid Looking for some advice here?

## 2017-01-21 ENCOUNTER — Encounter: Payer: Self-pay | Admitting: Family Medicine

## 2017-01-21 DIAGNOSIS — M2550 Pain in unspecified joint: Secondary | ICD-10-CM

## 2017-01-29 ENCOUNTER — Other Ambulatory Visit (INDEPENDENT_AMBULATORY_CARE_PROVIDER_SITE_OTHER): Payer: 59

## 2017-01-29 DIAGNOSIS — M2550 Pain in unspecified joint: Secondary | ICD-10-CM

## 2017-01-29 LAB — HEPATIC FUNCTION PANEL
ALK PHOS: 55 U/L (ref 39–117)
ALT: 12 U/L (ref 0–53)
AST: 17 U/L (ref 0–37)
Albumin: 4.1 g/dL (ref 3.5–5.2)
BILIRUBIN DIRECT: 0.2 mg/dL (ref 0.0–0.3)
BILIRUBIN TOTAL: 1.4 mg/dL — AB (ref 0.2–1.2)
Total Protein: 7.5 g/dL (ref 6.0–8.3)

## 2017-01-29 LAB — CBC WITH DIFFERENTIAL/PLATELET
BASOS ABS: 0.1 10*3/uL (ref 0.0–0.1)
BASOS PCT: 0.9 % (ref 0.0–3.0)
EOS ABS: 0.3 10*3/uL (ref 0.0–0.7)
Eosinophils Relative: 3.5 % (ref 0.0–5.0)
HCT: 36.4 % — ABNORMAL LOW (ref 39.0–52.0)
HEMOGLOBIN: 11.8 g/dL — AB (ref 13.0–17.0)
Lymphocytes Relative: 27.1 % (ref 12.0–46.0)
Lymphs Abs: 2.1 10*3/uL (ref 0.7–4.0)
MCHC: 32.5 g/dL (ref 30.0–36.0)
MCV: 77.8 fl — ABNORMAL LOW (ref 78.0–100.0)
MONO ABS: 0.9 10*3/uL (ref 0.1–1.0)
Monocytes Relative: 11.3 % (ref 3.0–12.0)
Neutro Abs: 4.4 10*3/uL (ref 1.4–7.7)
Neutrophils Relative %: 57.2 % (ref 43.0–77.0)
Platelets: 322 10*3/uL (ref 150.0–400.0)
RBC: 4.68 Mil/uL (ref 4.22–5.81)
RDW: 15.7 % — AB (ref 11.5–15.5)
WBC: 7.6 10*3/uL (ref 4.0–10.5)

## 2017-01-30 ENCOUNTER — Encounter: Payer: Self-pay | Admitting: Family Medicine

## 2017-01-30 LAB — TSH: TSH: 1.69 u[IU]/mL (ref 0.35–4.50)

## 2017-01-30 LAB — VITAMIN D 25 HYDROXY (VIT D DEFICIENCY, FRACTURES): VITD: 46.21 ng/mL (ref 30.00–100.00)

## 2017-02-11 ENCOUNTER — Encounter: Payer: Self-pay | Admitting: Family Medicine

## 2017-02-24 DIAGNOSIS — L920 Granuloma annulare: Secondary | ICD-10-CM | POA: Diagnosis not present

## 2017-02-24 DIAGNOSIS — L0203 Carbuncle of face: Secondary | ICD-10-CM | POA: Diagnosis not present

## 2017-04-09 NOTE — Progress Notes (Deleted)
[name] [address] [place], [place] [address] Phone: [phone] Subjective:    I'm seeing this patient by the request  of:    CC:   [id number]  Charles [name] is a 46 y.o. male coming in with complaint of ***  Onset-  Location Duration-  Character- Aggravating factors- Reliving factors-  Therapies tried-  Severity-     Past Medical History:  Diagnosis Date  . Allergy   . Arthritis   . Arthropathy    and scroilitis  . Asthma   . Blood transfusion without reported diagnosis   . Chronic drug-induced interstitial lung disorders ([place])   . Crohn's ileocolitis ([place]) 08/29/2008  . Dysplastic nevi   . GERD (gastroesophageal reflux disease)   . Herniated disc   . History of proctitis 2006  . IBD (inflammatory bowel disease)   . Incisional hernia   . Migraine headache   . Morton's neuroma of right foot   . Osteopenia   . Pouchitis ([place]) 2006  . Small bowel obstruction due to adhesions 02/09/2015  . Thyroid disease   . Vitamin D deficiency    Past Surgical History:  Procedure Laterality Date  . COLECTOMY    . COLONOSCOPY W/ BIOPSIES  04/16/2006   crohn's colitis  . FLEXIBLE SIGMOIDOSCOPY  2006 - 02/2014   ileocolitis, pouchitis.  with pouch ulcer.   . FUNCTIONAL ENDOSCOPIC SINUS SURGERY  12/2003   partial ethmoidectomy.  Dr Wilburn Cornelia  . ileoanal pull-through    . LUMBAR LAMINECTOMY/DECOMPRESSION MICRODISCECTOMY Right 10/13/2015   Procedure: Right Lumbar four- five Microdiskectomy;  Surgeon: Erline Levine, MD;  Location: [facility];  Service: Neurosurgery;  Laterality: Right;  Right L4-5 Microdiskectomy  . sacroilitis    . SIGMOIDOSCOPY  01/2005  . VENTRAL HERNIA REPAIR  06/2014   Social History   Social History  . Marital status: Married    Spouse name: N/A  . Number of children: 2  . Years of education: N/A   Occupational History  . [employer]   Social History Main Topics  . Smoking status:  Never Smoker  . Smokeless tobacco: Never Used  . Alcohol use 8.4 oz/week    14 Glasses of wine per week     Comment: couple of drinks daily  . Drug use: No  . Sexual activity: Yes   Other Topics Concern  . Not on file   Social History Narrative   Father a pulmonologist in [place].   Married to Dr. Honor Junes daughter.   Wife is CRNA   Allergies  Allergen Reactions  . Mercaptopurine Nausea And Vomiting    Felt ill within 2 days of initiating therapy.   Family History  Problem Relation Age of Onset  . Ulcerative colitis Mother   . Colon cancer Unknown        grandmother     Past medical history, social, surgical and family history all reviewed in electronic medical record.  No pertanent information unless stated regarding to the chief complaint.   Review of Systems:Review of systems updated and as accurate as of 04/09/17  No headache, visual changes, nausea, vomiting, diarrhea, constipation, dizziness, abdominal pain, skin rash, fevers, chills, night sweats, weight loss, swollen lymph nodes, body aches, joint swelling, muscle aches, chest pain, shortness of breath, mood changes.   Objective  There were no vitals taken for this visit. Systems examined below as of 04/09/17   General: No apparent distress alert and oriented x3 mood  and affect normal, dressed appropriately.  HEENT: Pupils equal, extraocular movements intact  Respiratory: Patient's speak in full sentences and does not appear short of breath  Cardiovascular: No lower extremity edema, non tender, no erythema  Skin: Warm dry intact with no signs of infection or rash on extremities or on axial skeleton.  Abdomen: Soft nontender  Neuro: Cranial nerves II through XII are intact, neurovascularly intact in all extremities with 2+ DTRs and 2+ pulses.  Lymph: No lymphadenopathy of posterior or anterior cervical chain or axillae bilaterally.  Gait normal with good balance and coordination.  MSK:  Non tender with full range  of motion and good stability and symmetric strength and tone of shoulders, elbows, wrist, hip, knee and ankles bilaterally.     Impression and Recommendations:     This case required medical decision making of moderate complexity.      Note: This dictation was prepared with Dragon dictation along with smaller phrase technology. Any transcriptional errors that result from this process are unintentional.

## 2017-04-10 ENCOUNTER — Encounter: Payer: Self-pay | Admitting: Family Medicine

## 2017-04-10 ENCOUNTER — Ambulatory Visit: Payer: Self-pay | Admitting: Family Medicine

## 2017-04-15 ENCOUNTER — Other Ambulatory Visit: Payer: Self-pay | Admitting: Internal Medicine

## 2017-04-17 ENCOUNTER — Ambulatory Visit (INDEPENDENT_AMBULATORY_CARE_PROVIDER_SITE_OTHER): Payer: 59 | Admitting: Family Medicine

## 2017-04-17 ENCOUNTER — Encounter: Payer: Self-pay | Admitting: Family Medicine

## 2017-04-17 ENCOUNTER — Other Ambulatory Visit: Payer: Self-pay | Admitting: Internal Medicine

## 2017-04-17 ENCOUNTER — Ambulatory Visit: Payer: Self-pay

## 2017-04-17 ENCOUNTER — Telehealth: Payer: Self-pay | Admitting: Family Medicine

## 2017-04-17 VITALS — BP 128/72 | HR 83 | Ht 70.0 in | Wt 159.0 lb

## 2017-04-17 DIAGNOSIS — M25531 Pain in right wrist: Secondary | ICD-10-CM

## 2017-04-17 DIAGNOSIS — S62164A Nondisplaced fracture of pisiform, right wrist, initial encounter for closed fracture: Secondary | ICD-10-CM | POA: Diagnosis not present

## 2017-04-17 DIAGNOSIS — S62163A Displaced fracture of pisiform, unspecified wrist, initial encounter for closed fracture: Secondary | ICD-10-CM | POA: Insufficient documentation

## 2017-04-17 DIAGNOSIS — S6981XA Other specified injuries of right wrist, hand and finger(s), initial encounter: Secondary | ICD-10-CM

## 2017-04-17 NOTE — Telephone Encounter (Signed)
Patient would like to switch PCP from Dr. Sherren Mocha to Dr. [name]. Wanted ok from both providers.
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
## 2017-04-17 NOTE — Telephone Encounter (Signed)
[place] with me.  [name]. [name], MD 04/17/2017 4:31 PM

## 2017-04-17 NOTE — Patient Instructions (Signed)
Good to see you  Brace at night  Consider flip the handles.  Otherwise continue all the other things TFCC tear ECU tendonitis and pisiform fracture

## 2017-04-17 NOTE — Assessment & Plan Note (Signed)
Patient does have a TFCC injury. Discussed with patient at great length. We discussed icing regimen and home exercises. We discussed which activities to do in which ones to avoid. Patient is to increase activity as tolerated. Patient will wear bracing at night. Encourage continued use of once weekly vitamin D secondary to patient's also anti-inflammation. Follow-up again in 4-6 weeks

## 2017-04-22 NOTE — Telephone Encounter (Signed)
Ok   [facility]

## 2017-04-23 ENCOUNTER — Ambulatory Visit: Payer: Self-pay | Admitting: Family Medicine

## 2017-04-28 DIAGNOSIS — D225 Melanocytic nevi of trunk: Secondary | ICD-10-CM | POA: Diagnosis not present

## 2017-04-28 DIAGNOSIS — L920 Granuloma annulare: Secondary | ICD-10-CM | POA: Diagnosis not present

## 2017-04-28 DIAGNOSIS — D224 Melanocytic nevi of scalp and neck: Secondary | ICD-10-CM | POA: Diagnosis not present

## 2017-04-30 ENCOUNTER — Encounter: Payer: Self-pay | Admitting: Family Medicine

## 2017-04-30 ENCOUNTER — Ambulatory Visit (INDEPENDENT_AMBULATORY_CARE_PROVIDER_SITE_OTHER): Payer: 59 | Admitting: Family Medicine

## 2017-04-30 VITALS — BP 130/80 | HR 70 | Ht 70.0 in | Wt 159.6 lb

## 2017-04-30 DIAGNOSIS — Z23 Encounter for immunization: Secondary | ICD-10-CM | POA: Diagnosis not present

## 2017-04-30 DIAGNOSIS — Z111 Encounter for screening for respiratory tuberculosis: Secondary | ICD-10-CM | POA: Diagnosis not present

## 2017-04-30 DIAGNOSIS — Z0001 Encounter for general adult medical examination with abnormal findings: Secondary | ICD-10-CM | POA: Diagnosis not present

## 2017-04-30 MED ORDER — TUBERCULIN PPD 5 UNIT/0.1ML ID SOLN
5.0000 [IU] | Freq: Once | INTRADERMAL | Status: AC
  Administered 2017-04-30: 5 [IU] via INTRADERMAL

## 2017-04-30 NOTE — Patient Instructions (Signed)

## 2017-04-30 NOTE — Progress Notes (Signed)
Subjective:  Charles [name] is a 46 y.o. male who presents today for his annual comprehensive physical exam.    HPI:  Charles [name] has no acute complaints today.   Lifestyle Diet: Balanced. Avoids several foods due his Crohn's Disease.  Exercise: Cyclists. Very active.   Depression screen PHQ 2/9 04/30/2017  Decreased Interest 0  Down, Depressed, Hopeless 0  PHQ - 2 Score 0   Health Maintenance Due  Topic Date Due  . HIV Screening  10/18/1987  . INFLUENZA VACCINE  03/19/2017    ROS: A 14 point review of systems was performed and was negative  PMH:  The following were reviewed and entered/updated in epic: Past Medical History:  Diagnosis Date  . Allergy   . Arthritis   . Arthropathy    and scroilitis  . Asthma   . Blood transfusion without reported diagnosis   . 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 Crohn's ileocolitis ([place]) 08/29/2008  . Dysplastic nevi   . GERD (gastroesophageal reflux disease)   . Herniated disc   . History of proctitis 2006  . IBD (inflammatory bowel disease)   . Incisional hernia   . Migraine headache   . Morton's neuroma of right foot   . Osteopenia   . 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 Small bowel obstruction due to adhesions ([place]) 02/09/2015  . Thyroid disease   . Vitamin D deficiency    Patient Active Problem List   Diagnosis Date Noted  . TFCC (triangular fibrocartilage complex) injury, right, initial encounter 04/17/2017  . Closed fracture of pisiform 04/17/2017  . Anxiety state 07/05/2016  . Carpal tunnel syndrome on right 05/30/2016  . Synovitis of finger 01/11/2016  . Herniated lumbar disc without myelopathy 10/13/2015  . Lumbar back pain with radiculopathy affecting right lower extremity 10/06/2015  . Os trigonum 09/21/2015  . Tibialis posterior tendinitis 08/10/2015  . 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 Chronic pain of right ankle 08/01/2015  . 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 Neuroma of third interspace of  left foot 04/10/2015  . Ethmoid sinusitis 02/10/2015  . Small bowel obstruction due to adhesions ([place]) 02/09/2015  . Thoracic back pain 12/06/2014  . Nonallopathic lesion of thoracic region 12/06/2014  . Nonallopathic lesion-rib cage 12/06/2014  . Nonallopathic lesion of lumbosacral region 12/06/2014  . Vitamin D deficiency 08/31/2014  . Pain in left shin 08/17/2014  . Loss of transverse plantar arch 07/26/2014  . Hip abductor tendinitis 07/26/2014  . Achilles tendinosis 07/05/2014  . De Quervain's disease (radial styloid tenosynovitis) 05/05/2014  . Morton's neuroma of right foot 01/18/2014  . Abdominal wall hernia 12/27/2013  . Crohn's disease ([place]) 12/27/2013  . Pouchitis ([place]) 09/21/2013  . Ulcerative colitis ([place]) 06/11/2013  . Airway hyperreactivity 06/11/2013  . Abdominal hernia 03/05/2013  . Dysplastic nevus of trunk 05/26/2012  . 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 EPIDERMOID CYST 02/15/2008  .  INFLAMMATORY ARTHROPATHY AND SACROILITIS 10/09/2007  . Osteopenia 10/09/2007  . CROHN'S DISEASE, LARGE AND SMALL INTESTINES 09/19/2002   Past Surgical History:  Procedure Laterality Date  . COLECTOMY    . COLONOSCOPY W/ BIOPSIES  04/16/2006   crohn's colitis  . FLEXIBLE SIGMOIDOSCOPY  2006 - 02/2014   ileocolitis, pouchitis.  with pouch ulcer.   . FUNCTIONAL ENDOSCOPIC SINUS SURGERY  12/2003   partial ethmoidectomy.  Dr Wilburn Cornelia  . ileoanal pull-through    . LUMBAR LAMINECTOMY/DECOMPRESSION MICRODISCECTOMY Right 10/13/2015   Procedure: Right Lumbar four- five  Microdiskectomy;  Surgeon: Erline Levine, MD;  Location: [facility];  Service: Neurosurgery;  Laterality: Right;  Right L4-5 Microdiskectomy  . sacroilitis    . SIGMOIDOSCOPY  01/2005  . VENTRAL HERNIA REPAIR  06/2014    Family History  Problem Relation Age of Onset  . Ulcerative colitis Mother   . Colon cancer Unknown        grandmother    Medications- reviewed and  updated Current Outpatient Prescriptions  Medication Sig Dispense Refill  . lansoprazole (PREVACID) 30 MG capsule Take 30 mg by mouth daily at 12 noon.    Marland Kitchen albuterol (PROVENTIL HFA;VENTOLIN HFA) 108 (90 BASE) MCG/ACT inhaler Inhale 2 puffs into the lungs every 6 (six) hours as needed. (Patient taking differently: Inhale 2 puffs into the lungs every 6 (six) hours as needed for wheezing. ) 18 g 3  . AZASITE 1 % ophthalmic solution     . celecoxib (CELEBREX) 100 MG capsule TAKE 1 CAPSULE BY MOUTH 2 TIMES DAILY AS NEEDED 60 capsule 11  . cholecalciferol (VITAMIN D) 1000 units tablet Take 1,000 Units by mouth daily.    Marland Kitchen erythromycin ophthalmic ointment APPLY TO INNER LID 4 TIMES DAILY FOR 2 DAYS, THEN TWICE DAILY FOR 4 WEEKS  0  . HUMIRA PEN 40 MG/0.8ML PNKT INJECT ONE PEN (40 MG) SUBCUTANEOUSLY EVERY OTHER WEEK. REFRIGERATE. 2 each 3  . HUMIRA PEN 40 MG/0.8ML PNKT INJECT ONE PEN (40 MG) SUBCUTANEOUSLY EVERY OTHER WEEK. REFRIGERATE. 4 each 1  . hydrocortisone (ANUSOL-HC) 2.5 % rectal cream Place 1 application rectally daily as needed for hemorrhoids or itching. 30 g 2  . hydrOXYzine (ATARAX/VISTARIL) 25 MG tablet TAKE 1 TABLET BY MOUTH 3 TIMES DAILY AS NEEDED 270 tablet 1  . lansoprazole (PREVACID) 30 MG capsule Take 1 capsule (30 mg total) by mouth 2 (two) times daily before a meal. 270 capsule 3  . LORazepam (ATIVAN) 1 MG tablet Take 1 tablet (1 mg total) by mouth at bedtime. (Patient not taking: Reported on 04/30/2017) 90 tablet 2  . mometasone-formoterol (DULERA) 100-5 MCG/ACT AERO Inhale 2 puffs into the lungs daily. Inhale 2 puffs into the lungs 2 times daily 4 Inhaler 3  . Probiotic Product (VSL#3 DS) PACK DISSOLVE 1 PACKET IN FLUID AND TAKE EVERY DAY 30 each 2  . Vitamin D, Ergocalciferol, (DRISDOL) 50000 units CAPS capsule TAKE ONE CAPSULE BY MOUTH EVERY 7 DAYS FOR EIGHT doses 30 capsule 0  . zolmitriptan (ZOMIG-ZMT) 5 MG disintegrating tablet DISSOLVE 1 TABLET BY MOUTH AS NEEDED AS DIRECTED  20 tablet 2   Current Facility-Administered Medications  Medication Dose Route Frequency Provider Last Rate Last Dose  . tuberculin injection 5 Units  5 Units Intradermal Once Vivi Barrack, MD   5 Units at 04/30/17 1015    Allergies-reviewed and updated Allergies  Allergen Reactions  . Mercaptopurine Nausea And Vomiting    Felt ill within 2 days of initiating therapy.    Social History   Social History  . Marital status: Married    Spouse name: N/A  . Number of children: 2  . Years of education: N/A   Occupational History  . [employer] rep [employer]   Social History Main Topics  . Smoking status: Never Smoker  . Smokeless tobacco: Never Used  . Alcohol use 8.4 oz/week    14 Glasses of wine per week     Comment: couple of drinks daily  . Drug use: No  . Sexual activity: Yes   Other  Topics Concern  . None   Social History Narrative   Father a Technical brewer in [place].   Married to Dr. Honor Junes daughter.   Wife is CRNA    Objective:  Physical Exam: BP 130/80 (BP Location: Left Arm, Patient Position: Sitting, Cuff Size: Normal)   Pulse 70   Ht 5' 10"  (1.778 m)   Wt 159 lb 9.6 oz (72.4 kg)   SpO2 98%   BMI 22.90 kg/m   Body mass index is 22.9 kg/m. Gen: NAD, resting comfortably CV: RRR with no murmurs appreciated Pulm: NWOB, CTAB with no crackles, wheezes, or rhonchi GI: Normal bowel sounds present. Several surgical scars noted. Soft, Nontender, Nondistended. MSK: no edema, cyanosis, or clubbing noted Skin: warm, dry Neuro: grossly normal, moves all extremities Psych: Normal affect and thought content  Assessment/Plan:  Preventative Healthcare: Flu shot given today.   Need for TB Skin Test Needs for work. Given today. Return in 2 days to be read.   Patient Counseling:  -Nutrition: Stressed importance of moderation in sodium/caffeine intake, saturated fat and cholesterol, caloric balance, sufficient intake of fresh fruits, vegetables, and  fiber.  -Stressed the importance of regular exercise.   -Substance Abuse: Discussed cessation/primary prevention of tobacco, alcohol, or other drug use; driving or other dangerous activities under the influence; availability of treatment for abuse.   -Injury prevention: Discussed safety belts, safety helmets, smoke detector, smoking near bedding or upholstery.   -Health maintenance and immunizations reviewed. Please refer to Health maintenance section.  Return to care in 1 year for next preventative visit.   [name]. [name], MD 04/30/2017 10:35 AM

## 2017-06-26 ENCOUNTER — Telehealth: Payer: Self-pay | Admitting: Internal Medicine

## 2017-06-26 NOTE — Telephone Encounter (Signed)
Left message on machine to call back  

## 2017-06-27 NOTE — Telephone Encounter (Signed)
Left message on machine to call back  

## 2017-06-30 ENCOUNTER — Ambulatory Visit: Payer: 59 | Admitting: Family Medicine

## 2017-06-30 ENCOUNTER — Encounter: Payer: Self-pay | Admitting: Family Medicine

## 2017-06-30 ENCOUNTER — Ambulatory Visit (INDEPENDENT_AMBULATORY_CARE_PROVIDER_SITE_OTHER): Payer: 59 | Admitting: Family Medicine

## 2017-06-30 VITALS — BP 120/80 | HR 85 | Ht 70.0 in | Wt 161.4 lb

## 2017-06-30 DIAGNOSIS — K51919 Ulcerative colitis, unspecified with unspecified complications: Secondary | ICD-10-CM

## 2017-06-30 DIAGNOSIS — B356 Tinea cruris: Secondary | ICD-10-CM | POA: Diagnosis not present

## 2017-06-30 DIAGNOSIS — K50919 Crohn's disease, unspecified, with unspecified complications: Secondary | ICD-10-CM

## 2017-06-30 NOTE — Assessment & Plan Note (Addendum)
Stable. Has GI follow up scheduled.

## 2017-06-30 NOTE — Telephone Encounter (Signed)
Patient wants to see Dr. Carlean Purl in the office to discuss possible procedure by the end of the year.  He will come in on 07/15/17 3:30 for an appt.

## 2017-06-30 NOTE — Progress Notes (Signed)
    Subjective:  Charles [name] is a 46 y.o. male who presents today with a chief complaint of skin rash.   HPI:  Skin rash, new issue Symptoms started about a month ago.  Located on the glans of his penis.  Area is irritated.  No [name].  No discharge.  No treatments tried.  He has a new sexual partner who has had "pH issues".  He is monogamous with this partner.  No fevers or chills.  ROS: Per HPI  Objective:  Physical Exam: BP 120/80   Pulse 85   Ht 5' 10"  (1.778 m)   Wt 161 lb 6.4 oz (73.2 kg)   SpO2 (!) 85%   BMI 23.16 kg/m   GU: Erythematous, irritated area along corona of glans penis.  No vesicles.  No pustules.  No penile discharge.  No skin lesions noted in groin otherwise.  Assessment/Plan:  Tinea cruris No signs of bacterial infection or STD.  Advised patient to use over-the-counter Lotrimin ointment twice daily.  Return precautions reviewed.  Consider oral medication if not improving on topical Lotrimin.  Charles [name]. Charles Pain, MD 06/30/2017 11:13 AM

## 2017-07-15 ENCOUNTER — Ambulatory Visit (INDEPENDENT_AMBULATORY_CARE_PROVIDER_SITE_OTHER): Payer: 59 | Admitting: Internal Medicine

## 2017-07-15 ENCOUNTER — Encounter: Payer: Self-pay | Admitting: Internal Medicine

## 2017-07-15 VITALS — BP 90/66 | HR 100 | Ht 70.0 in | Wt 159.0 lb

## 2017-07-15 DIAGNOSIS — Z79899 Other long term (current) drug therapy: Secondary | ICD-10-CM

## 2017-07-15 DIAGNOSIS — Z7960 Long term (current) use of unspecified immunomodulators and immunosuppressants: Secondary | ICD-10-CM

## 2017-07-15 DIAGNOSIS — K5000 Crohn's disease of small intestine without complications: Secondary | ICD-10-CM

## 2017-07-15 NOTE — Assessment & Plan Note (Signed)
Check pouch with "flex sig exam" Labs ok in June Had PPD in Sept - negative

## 2017-07-15 NOTE — Progress Notes (Signed)
Charles [name] 46 y.o. 12-Mar-1971 [id number]  Assessment & Plan:   Encounter Diagnoses  Name Primary?  . Crohn's disease of small intestine without complication ([place])   . Long-term use of immunosuppressant medication Yes   At this point he seems to be doing reasonably well but it is appropriate to check his pouch and distal ileum for any inflammation to confirm efficacy of therapy on Humira  PPD was neg based upon what I can put together. Had flu shot  WU:[id number], Charles Greenhouse, MD    Subjective:   Chief Complaint: f/u Crohn's  HPI Charles [name] is here for annual f/u. Remains on Humira. Had a transient SBO sx complex the other month - managed at home - lasted 24 hrs or less. Otherwise has been ok. SHx - separated from wife in past year  He had his flu shot and PPD thru PCP Dr. [name] - was oked to be able to get into hosp[ital etc though office did not document in chart   Allergies  Allergen Reactions  . Mercaptopurine Nausea And Vomiting    Felt ill within 2 days of initiating therapy.   Current Meds  Medication Sig  . albuterol (PROVENTIL HFA;VENTOLIN HFA) 108 (90 BASE) MCG/ACT inhaler Inhale 2 puffs into the lungs every 6 (six) hours as needed. (Patient taking differently: Inhale 2 puffs into the lungs every 6 (six) hours as needed for wheezing. )  . AZASITE 1 % ophthalmic solution   . celecoxib (CELEBREX) 100 MG capsule TAKE 1 CAPSULE BY MOUTH 2 TIMES DAILY AS NEEDED  . cholecalciferol (VITAMIN D) 1000 units tablet Take 1,000 Units by mouth daily.  Marland Kitchen HUMIRA PEN 40 MG/0.8ML PNKT INJECT ONE PEN (40 MG) SUBCUTANEOUSLY EVERY OTHER WEEK. REFRIGERATE.  Marland Kitchen lansoprazole (PREVACID) 30 MG capsule Take 1 capsule (30 mg total) by mouth 2 (two) times daily before a meal.  . Probiotic Product (VSL#3 DS) PACK DISSOLVE 1 PACKET IN FLUID AND TAKE EVERY DAY  . Vitamin D, Ergocalciferol, (DRISDOL) 50000 units CAPS capsule TAKE ONE CAPSULE BY MOUTH EVERY 7 DAYS FOR EIGHT doses  . zolmitriptan  (ZOMIG-ZMT) 5 MG disintegrating tablet DISSOLVE 1 TABLET BY MOUTH AS NEEDED AS DIRECTED   Past Medical History:  Diagnosis Date  . Allergy   . Arthritis   . Arthropathy    and scroilitis  . Asthma   . Blood transfusion without reported diagnosis   . 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 Crohn's ileocolitis ([place]) 08/29/2008  . Dysplastic nevi   . GERD (gastroesophageal reflux disease)   . Herniated disc   . History of proctitis 2006  . IBD (inflammatory bowel disease)   . Incisional hernia   . Migraine headache   . Morton's neuroma of right foot   . Osteopenia   . 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 Small bowel obstruction due to adhesions ([place]) 02/09/2015  . Thyroid disease   . Vitamin D deficiency    Past Surgical History:  Procedure Laterality Date  . COLECTOMY    . COLONOSCOPY W/ BIOPSIES  04/16/2006   crohn's colitis  . FLEXIBLE SIGMOIDOSCOPY  2006 - 02/2014   ileocolitis, pouchitis.  with pouch ulcer.   . FUNCTIONAL ENDOSCOPIC SINUS SURGERY  12/2003   partial ethmoidectomy.  Dr Wilburn Cornelia  . ileoanal pull-through    . LUMBAR LAMINECTOMY/DECOMPRESSION MICRODISCECTOMY Right 10/13/2015   Procedure: Right Lumbar four- five Microdiskectomy;  Surgeon: Erline Levine, MD;  Location: [facility];  Service: Neurosurgery;  Laterality: Right;  Right  L4-5 Microdiskectomy  . sacroilitis    . SIGMOIDOSCOPY  01/2005  . VENTRAL HERNIA REPAIR  06/2014   Social History   Social History Narrative   Father a Technical brewer in [place].   Married to Dr. Honor Junes daughter Gerald Stabs - separated 2017   Wife is CRNA   2 sons   + EtOH   No tobacco   No drugs   family history includes Brain cancer in his paternal grandmother; Colon cancer in his paternal grandmother; Ulcerative colitis in his mother.   Review of Systems As per HPI  Objective:   Physical Exam BP 90/66   Pulse 100   Ht 5' 10"  (1.778 m)   Wt 159 lb (72.1 kg)   BMI 22.81 kg/m  NAD  15 minutes time spent with  patient > half in counseling coordination of care

## 2017-07-15 NOTE — Patient Instructions (Signed)
You have been scheduled for a flexible sigmoidoscopy. Please follow the written instructions given to you at your visit today. If you use inhalers (even only as needed), please bring them with you on the day of your procedure.  I appreciate the opportunity to care for you. Silvano Rusk, MD, [facility]

## 2017-07-17 ENCOUNTER — Encounter: Payer: Self-pay | Admitting: Internal Medicine

## 2017-07-18 ENCOUNTER — Encounter: Payer: Self-pay | Admitting: Internal Medicine

## 2017-07-29 ENCOUNTER — Ambulatory Visit: Payer: Self-pay | Admitting: Family Medicine

## 2017-07-29 NOTE — Progress Notes (Signed)
[name] [address], [place] [address] Phone: [phone] Subjective:    I'm seeing this patient by the request  of:    CC: Right wrist pain follow-up  [id number]  Charles [name] is a 46 y.o. male coming in with complaint of nerve pain in his right wrist.  She was found to have more of a carpal tunnel.  Has been given an injection.  This was greater than a year ago.  Continues to have some mild dull, throbbing aching pain but seems to be more of the forearm.  Denies any weakness.  Only occurs intermittently.  Patient has been seen for back pain.  Done well with OMT  Discussed with patient my icing regimen and home exercises.  We discussed which activities of doing which wants to avoid.  Patient is to increase activity slowly over the course the next several days.  She has been doing them fairly well.  Has been very active but is doing a lot more changes recently.     Past Medical History:  Diagnosis Date  . Allergy   . Arthritis   . Arthropathy    and scroilitis  . Asthma   . Blood transfusion without reported diagnosis   . 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 Crohn's ileocolitis ([place]) 08/29/2008  . Dysplastic nevi   . GERD (gastroesophageal reflux disease)   . Herniated disc   . History of proctitis 2006  . IBD (inflammatory bowel disease)   . Incisional hernia   . Migraine headache   . Morton's neuroma of right foot   . Osteopenia   . Pouchitis ([place]) 2006  . Small bowel obstruction due to adhesions ([place]) 02/09/2015  . Thyroid disease   . Vitamin D deficiency    Past Surgical History:  Procedure Laterality Date  . COLECTOMY    . COLONOSCOPY W/ BIOPSIES  04/16/2006   crohn's colitis  . FLEXIBLE SIGMOIDOSCOPY  2006 - 02/2014   ileocolitis, pouchitis.  with pouch ulcer.   . FUNCTIONAL ENDOSCOPIC SINUS SURGERY  12/2003   partial ethmoidectomy.  Dr Wilburn Cornelia  . ileoanal pull-through    . LUMBAR  LAMINECTOMY/DECOMPRESSION MICRODISCECTOMY Right 10/13/2015   Procedure: Right Lumbar four- five Microdiskectomy;  Surgeon: Erline Levine, MD;  Location: [facility];  Service: Neurosurgery;  Laterality: Right;  Right L4-5 Microdiskectomy  . sacroilitis    . SIGMOIDOSCOPY  01/2005  . VENTRAL HERNIA REPAIR  06/2014   Social History   Socioeconomic History  . Marital status: Legally Separated    Spouse name: None  . Number of children: 2  . Years of education: 16 at least  . Highest education level: None  Social Needs  . Financial resource strain: None  . Food insecurity - worry: None  . Food insecurity - inability: None  . Transportation needs - medical: None  . Transportation needs - non-medical: None  Occupational History  . Occupation: Surveyor, quantity: [employer]  Tobacco Use  . Smoking status: Never Smoker  . Smokeless tobacco: Never Used  Substance and Sexual Activity  . Alcohol use: Yes    Alcohol/week: 8.4 oz    Types: 14 Glasses of wine per week    Comment: couple of drinks daily  . Drug use: No  . Sexual activity: Yes  Other Topics Concern  . None  Social History Narrative   Father a Technical brewer in [place].   Married to Dr. Honor Junes  daughter Charles [name] - separated 2017   Wife is CRNA   2 sons   + EtOH   No tobacco   No drugs   Allergies  Allergen Reactions  . Mercaptopurine Nausea And Vomiting    Felt ill within 2 days of initiating therapy.   Family History  Problem Relation Age of Onset  . Ulcerative colitis Mother   . Colon cancer Paternal Grandmother   . Brain cancer Paternal Grandmother        mets from colon     Past medical history, social, surgical and family history all reviewed in electronic medical record.  No pertanent information unless stated regarding to the chief complaint.   Review of Systems:Review of systems updated and as accurate as of 07/30/17  No headache, visual changes, nausea, vomiting, diarrhea,  constipation, dizziness,skin rash, fevers, chills, night sweats, weight loss, swollen lymph nodes, body aches, joint swelling, , chest pain, shortness of breath, mood changes.  Positive muscle aches intermittent abdominal pain  Objective  Blood pressure 118/82, pulse 74, height 5' 10"  (1.778 m), weight 154 lb (69.9 kg), SpO2 98 %. Systems examined below as of 07/30/17   General: No apparent distress alert and oriented x3 mood and affect normal, dressed appropriately.  HEENT: Pupils equal, extraocular movements intact  Respiratory: Patient's speak in full sentences and does not appear short of breath  Cardiovascular: No lower extremity edema, non tender, no erythema  Skin: Warm dry intact with no signs of infection or rash on extremities or on axial skeleton.  Abdomen: Soft nontender  Neuro: Cranial nerves II through XII are intact, neurovascularly intact in all extremities with 2+ DTRs and 2+ pulses.  Lymph: No lymphadenopathy of posterior or anterior cervical chain or axillae bilaterally.  Gait normal with good balance and coordination.  MSK:  Non tender with full range of motion and good stability and symmetric strength and tone of shoulders, elbows, hip, knee and ankles bilaterally.  Wrist: Right Inspection mild loss of range of motion ROM smooth and normal with good flexion and extension and ulnar/radial deviation that is symmetrical with opposite wrist. Palpation is normal over metacarpals, navicular, lunate, and TFCC; tendons without tenderness/ swelling No snuffbox tenderness. No tenderness over Canal of [place]. Strength 5/5 in all directions without pain. Negative Finkelstein, tinel's and phalens. Negative Watson's test.  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 Impression:  Contusion to superficial nerve of the forearm  Osteopathic findings C2 flexed rotated and side bent right C4 flexed rotated and side bent left C6 flexed rotated and side bent left T3 extended rotated and side bent right inhaled third rib T9 extended rotated and side bent left L2 flexed rotated and side bent right L4 flexed rotated and side bent left Sacrum right on right     Impression and Recommendations:     This case required medical decision making of moderate complexity.      Note: This dictation was prepared with Dragon dictation along with smaller phrase technology. Any transcriptional errors that result from this process are unintentional.

## 2017-07-30 ENCOUNTER — Ambulatory Visit (INDEPENDENT_AMBULATORY_CARE_PROVIDER_SITE_OTHER): Payer: 59 | Admitting: Family Medicine

## 2017-07-30 ENCOUNTER — Ambulatory Visit: Payer: Self-pay

## 2017-07-30 ENCOUNTER — Encounter: Payer: Self-pay | Admitting: Family Medicine

## 2017-07-30 VITALS — BP 118/82 | HR 74 | Ht 70.0 in | Wt 154.0 lb

## 2017-07-30 DIAGNOSIS — M25531 Pain in right wrist: Secondary | ICD-10-CM

## 2017-07-30 DIAGNOSIS — M999 Biomechanical lesion, unspecified: Secondary | ICD-10-CM

## 2017-07-30 DIAGNOSIS — M25539 Pain in unspecified wrist: Secondary | ICD-10-CM | POA: Insufficient documentation

## 2017-07-30 NOTE — Assessment & Plan Note (Signed)
Decision today to treat with OMT was based on Physical Exam  After verbal consent patient was treated with HVLA, ME, FPR techniques in cervical, thoracic, rib lumbar and sacral areas  Patient tolerated the procedure well with improvement in symptoms  Patient given exercises, stretches and lifestyle modifications  See medications in patient instructions if given  Patient will follow up in 4-8 weeks 

## 2017-07-30 NOTE — Assessment & Plan Note (Signed)
I believe the patient does have more of a forearm pain.  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  Patient is going to try to continue padding, we discussed the possibility and gabapentin which patient declined.  We discussed icing regimen or possible injection as well.  Patient will try conservative therapy and come back and see me again in 4-6 weeks

## 2017-07-30 NOTE — Patient Instructions (Signed)
nueropraxia Should do well  Avoid contact in the area Think of padding.  See em again in 4 weeks if not gone Happy holidays!

## 2017-08-01 ENCOUNTER — Other Ambulatory Visit: Payer: Self-pay

## 2017-08-01 ENCOUNTER — Ambulatory Visit (AMBULATORY_SURGERY_CENTER): Payer: 59 | Admitting: Internal Medicine

## 2017-08-01 ENCOUNTER — Encounter: Payer: Self-pay | Admitting: Internal Medicine

## 2017-08-01 VITALS — BP 118/77 | HR 66 | Temp 98.4°F | Resp 15 | Ht 70.0 in | Wt 159.0 lb

## 2017-08-01 DIAGNOSIS — K5090 Crohn's disease, unspecified, without complications: Secondary | ICD-10-CM | POA: Diagnosis not present

## 2017-08-01 DIAGNOSIS — K529 Noninfective gastroenteritis and colitis, unspecified: Secondary | ICD-10-CM | POA: Diagnosis not present

## 2017-08-01 DIAGNOSIS — K50918 Crohn's disease, unspecified, with other complication: Secondary | ICD-10-CM | POA: Diagnosis present

## 2017-08-01 MED ORDER — SODIUM CHLORIDE 0.9 % IV SOLN: 500.0000 mL | INTRAVENOUS | Status: DC

## 2017-08-01 NOTE — Op Note (Signed)
[facility] Patient Name: Charles [name] Procedure Date: 08/01/2017 1:15 PM MRN: [id number] Endoscopist: Gatha Mayer , MD Age: 46 Referring MD:  Date of Birth: September 26, 1970 Gender: Male Account #: [id number] Procedure:                Flexible Sigmoidoscopy Indications:              Crohn's disease of the small bowel Medicines:                Monitored Anesthesia Care Procedure:                Pre-Anesthesia Assessment:                           - Prior to the procedure, a History and Physical                            was performed, and patient medications and                            allergies were reviewed. The patient's tolerance of                            previous anesthesia was also reviewed. The risks                            and benefits of the procedure and the sedation                            options and risks were discussed with the patient.                            All questions were answered, and informed consent                            was obtained. Prior Anticoagulants: The patient has                            taken no previous anticoagulant or antiplatelet                            agents. ASA Grade Assessment: II - A patient with                            mild systemic disease. After reviewing the risks                            and benefits, the patient was deemed in                            satisfactory condition to undergo the procedure.                           After obtaining informed consent, the scope was  passed under direct vision. The Colonoscope was                            introduced through the anus and advanced to the 50                            cm from the anal verge. The flexible sigmoidoscopy                            was accomplished without difficulty. The patient                            tolerated the procedure well. The quality of the                            bowel preparation was  excellent. Scope In: Scope Out: Findings:                 The perianal and digital rectal examinations were                            normal.                           Discontinuous areas of nonbleeding ulcerated mucosa                            with no stigmata of recent bleeding were present in                            the rectal J pouch. 2 small ulcers and one larger                            ulcerated area Biopsies were taken with a cold                            forceps for histology. Verification of patient                            identification for the specimen was done. Estimated                            blood loss was minimal.                           The exam was otherwise without                            abnormality.Inspection of ileum Complications:            No immediate complications. Estimated Blood Loss:     Estimated blood loss was minimal. Impression:               - Mucosal ulceration in J pouch. Biopsied.                           -  The examination was otherwise normal. Recommendation:           - Patient has a contact number available for                            emergencies. The signs and symptoms of potential                            delayed complications were discussed with the                            patient. Return to normal activities tomorrow.                            Written discharge instructions were provided to the                            patient.                           - Resume previous diet.                           - Await pathology results. Gatha Mayer, MD 08/01/2017 1:49:12 PM This report has been signed electronically.

## 2017-08-01 NOTE — Patient Instructions (Addendum)
   There were 2 small ulcers and one larger area of inflammation in the pouch.  I took biopsies and will let you know what the results are.  I appreciate the opportunity to care for you. Gatha Mayer, MD, FACG   YOU HAD AN ENDOSCOPIC PROCEDURE TODAY AT [facility]:   Refer to the procedure report that was given to you for any specific questions about what was found during the examination.  If the procedure report does not answer your questions, please call your gastroenterologist to clarify.  If you requested that your care partner not be given the details of your procedure findings, then the procedure report has been included in a sealed envelope for you to review at your convenience later.  YOU SHOULD EXPECT: Some feelings of bloating in the abdomen. Passage of more gas than usual.  Walking can help get rid of the air that was put into your GI tract during the procedure and reduce the bloating. If you had a lower endoscopy (such as a colonoscopy or flexible sigmoidoscopy) you may notice spotting of blood in your stool or on the toilet paper. If you underwent a bowel prep for your procedure, you may not have a normal bowel movement for a few days.  Please Note:  You might notice some irritation and congestion in your nose or some drainage.  This is from the oxygen used during your procedure.  There is no need for concern and it should clear up in a day or so.  SYMPTOMS TO REPORT IMMEDIATELY:   Following lower endoscopy (colonoscopy or flexible sigmoidoscopy):  Excessive amounts of blood in the stool  Significant tenderness or worsening of abdominal pains  Swelling of the abdomen that is new, acute  Fever of 100F or higher    For urgent or emergent issues, a gastroenterologist can be reached at any hour by calling [phone].   DIET:  We do recommend a small meal at first, but then you may proceed to your regular diet.  Drink plenty of fluids but you should  avoid alcoholic beverages for 24 hours.  ACTIVITY:  You should plan to take it easy for the rest of today and you should NOT DRIVE or use heavy machinery until tomorrow (because of the sedation medicines used during the test).    FOLLOW UP: Our staff will call the number listed on your records the next business day following your procedure to check on you and address any questions or concerns that you may have regarding the information given to you following your procedure. If we do not reach you, we will leave a message.  However, if you are feeling well and you are not experiencing any problems, there is no need to return our call.  We will assume that you have returned to your regular daily activities without incident.  If any biopsies were taken you will be contacted by phone or by letter within the next 1-3 weeks.  Please call us at [phone] if you have not heard about the biopsies in 3 weeks.    SIGNATURES/CONFIDENTIALITY: You and/or your care partner have signed paperwork which will be entered into your electronic medical record.  These signatures attest to the fact that that the information above on your After Visit Summary has been reviewed and is understood.  Full responsibility of the confidentiality of this discharge information lies with you and/or your care-partner.

## 2017-08-01 NOTE — Progress Notes (Signed)
Called to room to assist during endoscopic procedure.  Patient ID and intended procedure confirmed with present staff. Received instructions for my participation in the procedure from the performing physician.  

## 2017-08-01 NOTE — Progress Notes (Signed)
To PACU, VSS. Report to [email or web address] 

## 2017-08-04 ENCOUNTER — Other Ambulatory Visit: Payer: Self-pay | Admitting: Internal Medicine

## 2017-08-04 ENCOUNTER — Telehealth: Payer: Self-pay | Admitting: *Deleted

## 2017-08-04 NOTE — Telephone Encounter (Signed)
  Follow up Call-  Call back number 08/01/2017 06/27/2015  Post procedure Call Back phone  # [phone]  Permission to leave phone message Yes Yes  Some recent data might be hidden     Patient questions:  Do you have a fever, pain , or abdominal swelling? No. Pain Score  0 *  Have you tolerated food without any problems? Yes.    Have you been able to return to your normal activities? Yes.    Do you have any questions about your discharge instructions: Diet   No. Medications  No. Follow up visit  No.  Do you have questions or concerns about your Care? No.  Actions: * If pain score is 4 or above: No action needed, pain <4.  Pt. Stated "I did great".

## 2017-08-08 ENCOUNTER — Other Ambulatory Visit: Payer: Self-pay | Admitting: Internal Medicine

## 2017-08-08 DIAGNOSIS — D899 Disorder involving the immune mechanism, unspecified: Principal | ICD-10-CM

## 2017-08-08 DIAGNOSIS — D849 Immunodeficiency, unspecified: Secondary | ICD-10-CM

## 2017-08-08 DIAGNOSIS — K5080 Crohn's disease of both small and large intestine without complications: Secondary | ICD-10-CM

## 2017-08-08 NOTE — Progress Notes (Signed)
Spoke to patient Densely inflamed mucosa on path  Plan to check trough levels of Humira and Abs  He will do in 2 weeks  No letter/recall

## 2017-08-14 ENCOUNTER — Encounter: Payer: Self-pay | Admitting: Family Medicine

## 2017-08-15 ENCOUNTER — Telehealth: Payer: Self-pay | Admitting: Family Medicine

## 2017-08-15 ENCOUNTER — Encounter: Payer: Self-pay | Admitting: Family Medicine

## 2017-08-15 ENCOUNTER — Ambulatory Visit (INDEPENDENT_AMBULATORY_CARE_PROVIDER_SITE_OTHER): Payer: 59 | Admitting: Family Medicine

## 2017-08-15 VITALS — BP 112/76 | HR 84 | Temp 98.8°F | Ht 70.0 in | Wt 162.8 lb

## 2017-08-15 DIAGNOSIS — R059 Cough, unspecified: Secondary | ICD-10-CM

## 2017-08-15 DIAGNOSIS — R05 Cough: Secondary | ICD-10-CM | POA: Diagnosis not present

## 2017-08-15 MED ORDER — GUAIFENESIN-CODEINE 100-10 MG/5ML PO SOLN: 5.0000 mL | Freq: Three times a day (TID) | ORAL | 0 refills | Status: DC | PRN

## 2017-08-15 MED ORDER — AZITHROMYCIN 250 MG PO TABS: ORAL_TABLET | ORAL | 0 refills | Status: DC

## 2017-08-15 MED ORDER — IPRATROPIUM BROMIDE 0.06 % NA SOLN
2.0000 | Freq: Four times a day (QID) | NASAL | 0 refills | Status: DC
Start: 2017-08-15 — End: 2018-05-29

## 2017-08-15 MED ORDER — METHYLPREDNISOLONE ACETATE 80 MG/ML IJ SUSP
80.0000 mg | Freq: Once | INTRAMUSCULAR | Status: AC
  Administered 2017-08-15: 80 mg via INTRAMUSCULAR

## 2017-08-15 NOTE — Telephone Encounter (Signed)
Copied from [place] [phone]. Topic: Appointment Scheduling - Scheduling Inquiry for Clinic >> Aug 15, 2017  9:36 AM Aurelio Brash B wrote: Reason for CRM:Pt would like to be worked in to see Dr [name] today, he thinks he has the flu

## 2017-08-15 NOTE — Telephone Encounter (Signed)
Patient coming in today at 1:00.

## 2017-08-15 NOTE — Progress Notes (Signed)
    Subjective:  Charles [name] is a 46 y.o. male who presents today with a chief complaint of cough.   HPI:  Cough, acute issue Symptoms started 3 days ago.  Worsened over that time.  Associated symptoms include nasal congestion, sore throat, rhinorrhea, sputum production, myalgias, and fever to 101 F.  He is tried several over-the-counter medications including Mucinex and Delsym which helps some.  He is also been taking ibuprofen and Tylenol which helps a little bit.  No sick contacts.  No obvious precipitating events.  No obvious alleviating or aggravating factors.  ROS: Per HPI  PMH: Smoking history reviewed.  Never smoker.  Objective:  Physical Exam: BP 112/76 (BP Location: Left Arm, Patient Position: Sitting, Cuff Size: Large)   Pulse 84   Temp 98.8 F (37.1 C) (Oral)   Ht 5' 10"  (1.778 m)   Wt 162 lb 12.8 oz (73.8 kg)   SpO2 97%   BMI 23.36 kg/m   Gen: NAD, resting comfortably HEENT: TMs with clear effusion bilaterally.  Left maxillary sinus with decreased transillumination.  Right maxillary sinus with normal transillumination.  Oropharynx erythematous without exudate.  No lymphadenopathy. CV: RRR with no murmurs appreciated Pulm: NWOB, CTAB with no crackles, wheezes, or rhonchi  Rapid flu negative Assessment/Plan:  Cough No signs of bacterial infection.  Likely viral upper respiratory infection.  His rapid flu was negative.  Will give 80 mg IM Depo-Medrol today.  Also start Atrovent nasal spray.  Sent in a "pocket prescription" for azithromycin with strict instruction to not start unless symptoms worsen or fail to improve over the next 3-5 days.  Encouraged patient to continue Tylenol and/or Motrin as needed for low-grade fever and pain.  Encouraged good oral hydration.  Return precautions reviewed.  Follow-up as needed.  [name]. [name], MD 08/15/2017 2:06 PM

## 2017-08-15 NOTE — Patient Instructions (Signed)
Start the atrovent and cough syrup.  Star the azithromycin if not improving in a few days.  Let me know if symptoms worsen or are not improving.  Take care, Dr [name]

## 2017-09-05 ENCOUNTER — Other Ambulatory Visit: Payer: 59

## 2017-09-05 DIAGNOSIS — K5080 Crohn's disease of both small and large intestine without complications: Secondary | ICD-10-CM | POA: Diagnosis not present

## 2017-09-12 LAB — SERIAL MONITORING

## 2017-09-13 LAB — ADALIMUMAB+AB (SERIAL MONITOR): ADALIMUMAB DRUG LEVEL: 8.7 ug/mL

## 2017-09-15 NOTE — Progress Notes (Signed)
My Chart message  Levels good, no Abs  ? Tx pouchitis

## 2017-09-19 ENCOUNTER — Telehealth: Payer: Self-pay | Admitting: Internal Medicine

## 2017-09-19 ENCOUNTER — Telehealth: Payer: Self-pay

## 2017-09-19 MED ORDER — DEXLANSOPRAZOLE 30 MG PO CPDR: 30.0000 mg | DELAYED_RELEASE_CAPSULE | Freq: Every day | ORAL | 0 refills | Status: DC

## 2017-09-19 MED ORDER — RIFAXIMIN 550 MG PO TABS: 550.0000 mg | ORAL_TABLET | Freq: Three times a day (TID) | ORAL | 0 refills | Status: DC

## 2017-09-19 NOTE — Telephone Encounter (Signed)
xifaxan samples put up front for pick up.

## 2017-09-19 NOTE — Telephone Encounter (Signed)
Sent a request for Dexilant approval thru CoverMyMeds, Dx: GERD K21.9 , will await out come.

## 2017-09-19 NOTE — Telephone Encounter (Signed)
Spoke to him  1) Having thrush-like changes in his mouth - picked up by dentsit - Dukes Magic Mouthwash helped 2) Some increased GERD sxs 3) GI o/w seems ok right now 4) reviewed adalimumab test results  Plans  1) Try to see if can afford Dexilant again - 30 mg daily rx sent 2) Xifaxan 550 mg tid x 14 days - samples will be left for him 3) He will contact me with results after these changes  4) If thrush changes return told him would be good to get a scraping/test to see if yeast vs other

## 2017-09-24 ENCOUNTER — Encounter: Payer: Self-pay | Admitting: Internal Medicine

## 2017-10-01 NOTE — Telephone Encounter (Signed)
Called the pharmacy yesterday and patient's dexilant still not going thru.  Went back on CoverMyMeds and answered more questions about this claim and re-sent it to the plan.

## 2017-10-02 ENCOUNTER — Other Ambulatory Visit: Payer: Self-pay | Admitting: Internal Medicine

## 2017-10-02 ENCOUNTER — Telehealth: Payer: Self-pay

## 2017-10-02 MED ORDER — DEXLANSOPRAZOLE 30 MG PO CPDR: 30.0000 mg | DELAYED_RELEASE_CAPSULE | Freq: Every day | ORAL | 3 refills | Status: DC

## 2017-10-02 NOTE — Telephone Encounter (Signed)
D[facility]ilant sent to [facility] [address] per Dr Carlean Purl. We received a fax this AM saying after researching that his Dexilant does not require a prior authorization.

## 2017-10-02 NOTE — Telephone Encounter (Signed)
Received a fax from [place] that patients [place] doesn't require a prior authorization.  Dexilant resent to [facility] on [address] per Dr Celesta Aver request.

## 2017-10-02 NOTE — Telephone Encounter (Signed)
-----   Message from Gatha Mayer, MD sent at 10/02/2017 11:11 AM EST ----- Regarding: dexilant rx He needs 30 mg qd # 90 RF x 3 sent to [facility] [address]  I tried but it said would not e prescribe  Thanks

## 2017-11-17 ENCOUNTER — Telehealth: Payer: Self-pay | Admitting: Internal Medicine

## 2017-11-17 DIAGNOSIS — K50018 Crohn's disease of small intestine with other complication: Secondary | ICD-10-CM

## 2017-11-17 DIAGNOSIS — R140 Abdominal distension (gaseous): Secondary | ICD-10-CM

## 2017-11-18 NOTE — Telephone Encounter (Signed)
Charles [name] had development of constipation, abdominal distention and sens of obstruction today - abdominal pain also. Concomitant migraine also. He contacted me asking about getting an xray - we discussed goind to ED. In the following AM he said he opened up and was ok.  Never had nausea or vomiting.  I had ordered a 2 view abdomen at [facility] - he did not go for that. Will cancel

## 2017-12-18 ENCOUNTER — Other Ambulatory Visit: Payer: Self-pay | Admitting: Internal Medicine

## 2017-12-18 NOTE — Telephone Encounter (Signed)
Please advise Sir? 

## 2017-12-18 NOTE — Telephone Encounter (Signed)
Refill x 6

## 2017-12-22 DIAGNOSIS — D2261 Melanocytic nevi of right upper limb, including shoulder: Secondary | ICD-10-CM | POA: Diagnosis not present

## 2017-12-22 DIAGNOSIS — D2262 Melanocytic nevi of left upper limb, including shoulder: Secondary | ICD-10-CM | POA: Diagnosis not present

## 2017-12-22 DIAGNOSIS — L570 Actinic keratosis: Secondary | ICD-10-CM | POA: Diagnosis not present

## 2017-12-22 DIAGNOSIS — D225 Melanocytic nevi of trunk: Secondary | ICD-10-CM | POA: Diagnosis not present

## 2017-12-22 DIAGNOSIS — D224 Melanocytic nevi of scalp and neck: Secondary | ICD-10-CM | POA: Diagnosis not present

## 2017-12-22 DIAGNOSIS — L929 Granulomatous disorder of the skin and subcutaneous tissue, unspecified: Secondary | ICD-10-CM | POA: Diagnosis not present

## 2017-12-22 DIAGNOSIS — D485 Neoplasm of uncertain behavior of skin: Secondary | ICD-10-CM | POA: Diagnosis not present

## 2017-12-29 DIAGNOSIS — L988 Other specified disorders of the skin and subcutaneous tissue: Secondary | ICD-10-CM | POA: Diagnosis not present

## 2017-12-29 DIAGNOSIS — L910 Hypertrophic scar: Secondary | ICD-10-CM | POA: Diagnosis not present

## 2017-12-29 DIAGNOSIS — D485 Neoplasm of uncertain behavior of skin: Secondary | ICD-10-CM | POA: Diagnosis not present

## 2018-01-14 DIAGNOSIS — E559 Vitamin D deficiency, unspecified: Secondary | ICD-10-CM | POA: Diagnosis not present

## 2018-01-14 DIAGNOSIS — R5383 Other fatigue: Secondary | ICD-10-CM | POA: Diagnosis not present

## 2018-01-14 DIAGNOSIS — K529 Noninfective gastroenteritis and colitis, unspecified: Secondary | ICD-10-CM | POA: Diagnosis not present

## 2018-03-02 ENCOUNTER — Encounter: Payer: Self-pay | Admitting: Family Medicine

## 2018-03-11 ENCOUNTER — Other Ambulatory Visit: Payer: Self-pay | Admitting: Internal Medicine

## 2018-03-27 ENCOUNTER — Encounter: Payer: Self-pay | Admitting: Family Medicine

## 2018-03-27 ENCOUNTER — Other Ambulatory Visit: Payer: Self-pay | Admitting: Family Medicine

## 2018-03-30 ENCOUNTER — Other Ambulatory Visit: Payer: Self-pay

## 2018-03-30 NOTE — Telephone Encounter (Signed)
Pt is seen by DR [name], please advise

## 2018-03-31 NOTE — Telephone Encounter (Signed)
Please let pt know I sent in a refill. Plan to see him for his appt next month.  Charles [name]. [name], MD 03/31/2018 8:28 AM

## 2018-04-06 ENCOUNTER — Other Ambulatory Visit: Payer: Self-pay | Admitting: Internal Medicine

## 2018-04-07 MED ORDER — LANSOPRAZOLE 30 MG PO CPDR: 30.0000 mg | DELAYED_RELEASE_CAPSULE | Freq: Two times a day (BID) | ORAL | 2 refills | Status: DC

## 2018-04-13 NOTE — Progress Notes (Signed)
[name] [address], [place] [address] Phone: [phone] Subjective:    I Charles [name] am serving as a Education administrator for Dr. Hulan Saas.      CC:   [id number]  Charles [name] is a 47 y.o. male coming in with complaint of left knee and right hip pain. States his knee doesn't have much pain. Injured it in his 57s. Deformity. Scars on the knee that are painful sometimes. Has had injection before. Believes that his hips are off. Numbness in the toes due to back surgery.   Onset- 1 week ago Location- Lateral hip, patellar tendon Duration-  Character- Tight Aggravating factors- Running  Reliving factors-  Therapies tried- bio freeze Severity-     Past Medical History:  Diagnosis Date  . Allergy   . Arthritis   . Arthropathy    and scroilitis  . Asthma   . Blood transfusion without reported diagnosis   . 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 Crohn's ileocolitis ([place]) 08/29/2008  . Dysplastic nevi   . GERD (gastroesophageal reflux disease)   . Herniated disc   . History of proctitis 2006  . IBD (inflammatory bowel disease)   . Incisional hernia   . Migraine headache   . Morton's neuroma of right foot   . Osteopenia   . Pouchitis ([place]) 2006  . Small bowel obstruction due to adhesions ([place]) 02/09/2015  . Thyroid disease   . Vitamin D deficiency    Past Surgical History:  Procedure Laterality Date  . COLECTOMY    . COLONOSCOPY W/ BIOPSIES  04/16/2006   crohn's colitis  . FLEXIBLE SIGMOIDOSCOPY  2006 - 02/2014   ileocolitis, pouchitis.  with pouch ulcer.   . FUNCTIONAL ENDOSCOPIC SINUS SURGERY  12/2003   partial ethmoidectomy.  Dr Wilburn Cornelia  . ileoanal pull-through    . LUMBAR LAMINECTOMY/DECOMPRESSION MICRODISCECTOMY Right 10/13/2015   Procedure: Right Lumbar four- five Microdiskectomy;  Surgeon: Erline Levine, MD;  Location: [facility];  Service: Neurosurgery;  Laterality: Right;  Right L4-5 Microdiskectomy    . sacroilitis    . SIGMOIDOSCOPY  01/2005  . VENTRAL HERNIA REPAIR  06/2014   Social History   Socioeconomic History  . Marital status: Divorced    Spouse name: Not on file  . Number of children: 2  . Years of education: 16 at least  . Highest education level: Not on file  Occupational History  . Occupation: Surveyor, quantity: [employer]  . Financial resource strain: Not on file  . Food insecurity:    Worry: Not on file    Inability: Not on file  . Transportation needs:    Medical: Not on file    Non-medical: Not on file  Tobacco Use  . Smoking status: Never Smoker  . Smokeless tobacco: Never Used  Substance and Sexual Activity  . Alcohol use: Yes    Alcohol/week: 14.0 standard drinks    Types: 14 Glasses of wine per week    Comment: couple of drinks daily  . Drug use: No  . Sexual activity: Yes  Lifestyle  . Physical activity:    Days per week: Not on file    Minutes per session: Not on file  . Stress: Not on file  Relationships  . Social connections:    Talks on phone: Not on file    Gets together: Not on file    Attends religious service: Not on  file    Active member of club or organization: Not on file    Attends meetings of clubs or organizations: Not on file    Relationship status: Not on file  Other Topics Concern  . Not on file  Social History Narrative   Father a pulmonologist in [place].   Married to Dr. Honor Junes daughter Gerald Stabs - separated 2017   Wife is CRNA   2 sons   + EtOH   No tobacco   No drugs   Allergies  Allergen Reactions  . Mercaptopurine Nausea And Vomiting    Felt ill within 2 days of initiating therapy.   Family History  Problem Relation Age of Onset  . Ulcerative colitis Mother   . Colon cancer Paternal Grandmother   . Brain cancer Paternal Grandmother        mets from colon      Current Outpatient Medications (Respiratory):  .  albuterol (PROVENTIL HFA;VENTOLIN HFA) 108 (90 BASE)  MCG/ACT inhaler, Inhale 2 puffs into the lungs every 6 (six) hours as needed. (Patient not taking: Reported on 04/14/2018) .  guaiFENesin-codeine 100-10 MG/5ML syrup, Take 5 mLs by mouth 3 (three) times daily as needed for cough. (Patient not taking: Reported on 04/14/2018) .  ipratropium (ATROVENT) 0.06 % nasal spray, Place 2 sprays into both nostrils 4 (four) times daily. (Patient not taking: Reported on 04/14/2018)  Current Outpatient Medications (Analgesics):  .  celecoxib (CELEBREX) 100 MG capsule, TAKE 1 CAPSULE BY MOUTH 2 TIMES DAILY AS NEEDED .  HUMIRA PEN 40 MG/0.8ML PNKT, INJECT ONE PEN SUBCUTANEOUSLY EVERY OTHER WEEK. REFRIGERATE. Marland Kitchen  zolmitriptan (ZOMIG-ZMT) 5 MG disintegrating tablet, DISSOLVE 1 TABLET BY MOUTH AS NEEDED AS DIRECTED   Current Outpatient Medications (Other):  .  cholecalciferol (VITAMIN D) 1000 units tablet, Take 1,000 Units by mouth daily. .  lansoprazole (PREVACID) 30 MG capsule, Take 1 capsule (30 mg total) by mouth 2 (two) times daily. .  AZASITE 1 % ophthalmic solution,  .  azithromycin (ZITHROMAX) 250 MG tablet, Take 2 tabs day 1, then 1 tab daily (Patient not taking: Reported on 04/14/2018) .  Probiotic Product (VSL#3 DS) PACK, DISSOLVE 1 PACKET IN FLUID AND TAKE EVERY DAY (Patient not taking: Reported on 04/14/2018) .  rifaximin (XIFAXAN) 550 MG TABS tablet, Take 1 tablet (550 mg total) by mouth 3 (three) times daily. (Patient not taking: Reported on 04/14/2018) .  Vitamin D, Ergocalciferol, (DRISDOL) 50000 units CAPS capsule, TAKE ONE CAPSULE BY MOUTH EVERY 7 DAYS FOR EIGHT doses (Patient not taking: Reported on 04/14/2018)    Past medical history, social, surgical and family history all reviewed in electronic medical record.  No pertanent information unless stated regarding to the chief complaint.   Review of Systems:  No headache, visual changes, nausea, vomiting, diarrhea, constipation, dizziness, abdominal pain, skin rash, fevers, chills, night sweats, weight  loss, swollen lymph nodes, body aches, joint swelling,chest pain, shortness of breath, mood changes.  Positive muscle aches  Objective  Blood pressure 110/70, pulse 87, height 5' 10"  (1.778 m), weight 148 lb (67.1 kg), SpO2 98 %.    General: No apparent distress alert and oriented x3 mood and affect normal, dressed appropriately.  HEENT: Pupils equal, extraocular movements intact  Respiratory: Patient's speak in full sentences and does not appear short of breath  Cardiovascular: No lower extremity edema, non tender, no erythema  Skin: Warm dry intact with no signs of infection or rash on extremities or on axial skeleton.  Abdomen: Soft nontender  Neuro:  Cranial nerves II through XII are intact, neurovascularly intact in all extremities with 2+ DTRs and 2+ pulses.  Lymph: No lymphadenopathy of posterior or anterior cervical chain or axillae bilaterally.  Gait normal with good balance and coordination.  MSK:  Non tender with full range of motion and good stability and symmetric strength and tone of shoulders, elbows, wrist, hip, and ankles bilaterally.  Left knee exam shows the patient does have swelling on the anterior aspect of the patella and inferior over the patella tendon.  Patella tendon is intact and extensor mechanism intact.  Mild tenderness to palpation.  Fullness appears to be superficial to the patella  Limited musculoskeletal ultrasound was performed and interpreted by Lyndal Pulley  Limited ultrasound shows that patient does have some soft tissue fullness noted with calcific changes.  Possible uric acid deposits.  Patient's tendon is not having any significant changes though.  Mild increase in Doppler flow but no vascularity  Impression: Soft tissue changes possible gout versus pseudogout deposits  Back Exam:  Inspection: Unremarkable  Motion: Flexion 45 deg, Extension 25 deg, Side Bending to 35 deg bilaterally,  Rotation to 35 deg bilaterally  SLR laying: Negative  XSLR  laying: Negative  Palpable tenderness: Tender to palpation the paraspinal musculature.Marland Kitchen FABER: TTP patient does have tightness in the right. Sensory change: Gross sensation intact to all lumbar and sacral dermatomes.  Reflexes: 2+ at both patellar tendons, 2+ at achilles tendons, Babinski's downgoing.  Strength at foot  Plantar-flexion: 5/5 Dorsi-flexion: 5/5 Eversion: 5/5 Inversion: 5/5  Leg strength  Quad: 5/5 Hamstring: 5/5 Hip flexor: 5/5 Hip abductors: 4/5  Gait unremarkable.  Osteopathic findings C6 flexed rotated and side bent left T3 extended rotated and side bent right inhaled third rib T9 extended rotated and side bent left L2 flexed rotated and side bent right Sacrum right on right     Impression and Recommendations:     This case required medical decision making of moderate complexity. The above documentation has been reviewed and is accurate and complete Lyndal Pulley, DO       Note: This dictation was prepared with Dragon dictation along with smaller phrase technology. Any transcriptional errors that result from this process are unintentional.

## 2018-04-14 ENCOUNTER — Ambulatory Visit: Payer: Self-pay

## 2018-04-14 ENCOUNTER — Other Ambulatory Visit (INDEPENDENT_AMBULATORY_CARE_PROVIDER_SITE_OTHER): Payer: 59

## 2018-04-14 ENCOUNTER — Encounter: Payer: Self-pay | Admitting: Family Medicine

## 2018-04-14 ENCOUNTER — Ambulatory Visit (INDEPENDENT_AMBULATORY_CARE_PROVIDER_SITE_OTHER): Payer: 59 | Admitting: Family Medicine

## 2018-04-14 VITALS — BP 110/70 | HR 87 | Ht 70.0 in | Wt 148.0 lb

## 2018-04-14 DIAGNOSIS — M2550 Pain in unspecified joint: Secondary | ICD-10-CM

## 2018-04-14 DIAGNOSIS — G8929 Other chronic pain: Secondary | ICD-10-CM | POA: Diagnosis not present

## 2018-04-14 DIAGNOSIS — M5416 Radiculopathy, lumbar region: Secondary | ICD-10-CM

## 2018-04-14 DIAGNOSIS — M25462 Effusion, left knee: Secondary | ICD-10-CM | POA: Insufficient documentation

## 2018-04-14 DIAGNOSIS — M25562 Pain in left knee: Principal | ICD-10-CM

## 2018-04-14 DIAGNOSIS — M999 Biomechanical lesion, unspecified: Secondary | ICD-10-CM | POA: Diagnosis not present

## 2018-04-14 LAB — VITAMIN D 25 HYDROXY (VIT D DEFICIENCY, FRACTURES): VITD: 33.97 ng/mL (ref 30.00–100.00)

## 2018-04-14 LAB — IBC PANEL
Iron: 79 ug/dL (ref 42–165)
Saturation Ratios: 17.6 % — ABNORMAL LOW (ref 20.0–50.0)
Transferrin: 320 mg/dL (ref 212.0–360.0)

## 2018-04-14 LAB — URIC ACID: URIC ACID, SERUM: 8.1 mg/dL — AB (ref 4.0–7.8)

## 2018-04-14 LAB — SEDIMENTATION RATE: Sed Rate: 57 mm/hr — ABNORMAL HIGH (ref 0–15)

## 2018-04-14 MED ORDER — ALLOPURINOL 100 MG PO TABS: 200.0000 mg | ORAL_TABLET | Freq: Every day | ORAL | 6 refills | Status: DC

## 2018-04-14 NOTE — Patient Instructions (Signed)
Good to see you  Labs downstairs New exercises to go after the piriformis.  Stay active man  You are doing well overall  Make an appointment in 4 weeks just in case

## 2018-04-14 NOTE — Assessment & Plan Note (Signed)
Soft tissue, patella or patella tendon does not seem to be affected.  Discussed with patient's mother there is a concern for possible gout and laboratory work-up ordered.  Discussed icing regimen and home exercises.  Discussed which activities to do which wants to avoid.  Patient will avoid impact, we discussed different diet changes.  Follow-up again in 4 to 6 weeks

## 2018-04-14 NOTE — Assessment & Plan Note (Signed)
Decision today to treat with OMT was based on Physical Exam  After verbal consent patient was treated with HVLA, ME, FPR techniques in cervical, thoracic, rib, lumbar and sacral areas  Patient tolerated the procedure well with improvement in symptoms  Patient given exercises, stretches and lifestyle modifications  See medications in patient instructions if given  Patient will follow up in 4-6 weeks 

## 2018-04-14 NOTE — Assessment & Plan Note (Signed)
Increasing radicular symptoms more on the right side again.  Seems to be more of the piriformis.  Home exercises given.  Discussed icing regimen.  Discussed home monitoring for any type of weakness again.  Follow-up again with me in 4 to 6 weeks.  Responded well to manipulation

## 2018-04-15 LAB — PTH, INTACT AND CALCIUM
Calcium: 9.6 mg/dL (ref 8.6–10.3)
PTH: 32 pg/mL (ref 14–64)

## 2018-04-15 LAB — CALCIUM, IONIZED: Calcium, Ion: 5.14 mg/dL (ref 4.8–5.6)

## 2018-05-12 ENCOUNTER — Encounter: Payer: Self-pay | Admitting: Family Medicine

## 2018-05-12 DIAGNOSIS — K5000 Crohn's disease of small intestine without complications: Secondary | ICD-10-CM

## 2018-05-22 ENCOUNTER — Other Ambulatory Visit: Payer: 59

## 2018-05-22 DIAGNOSIS — K5000 Crohn's disease of small intestine without complications: Secondary | ICD-10-CM | POA: Diagnosis not present

## 2018-05-22 LAB — IRON,TIBC AND FERRITIN PANEL
%SAT: 20 % (calc) (ref 20–48)
FERRITIN: 22 ng/mL — AB (ref 38–380)
Iron: 76 ug/dL (ref 50–180)
TIBC: 384 mcg/dL (calc) (ref 250–425)

## 2018-05-29 ENCOUNTER — Ambulatory Visit (INDEPENDENT_AMBULATORY_CARE_PROVIDER_SITE_OTHER): Payer: 59 | Admitting: Family Medicine

## 2018-05-29 ENCOUNTER — Encounter: Payer: Self-pay | Admitting: Family Medicine

## 2018-05-29 VITALS — BP 126/84 | HR 67 | Temp 98.6°F | Ht 70.0 in | Wt 160.4 lb

## 2018-05-29 DIAGNOSIS — D899 Disorder involving the immune mechanism, unspecified: Secondary | ICD-10-CM | POA: Diagnosis not present

## 2018-05-29 DIAGNOSIS — Z0001 Encounter for general adult medical examination with abnormal findings: Secondary | ICD-10-CM

## 2018-05-29 DIAGNOSIS — Z23 Encounter for immunization: Secondary | ICD-10-CM | POA: Diagnosis not present

## 2018-05-29 DIAGNOSIS — K50918 Crohn's disease, unspecified, with other complication: Secondary | ICD-10-CM | POA: Diagnosis not present

## 2018-05-29 DIAGNOSIS — D849 Immunodeficiency, unspecified: Secondary | ICD-10-CM

## 2018-05-29 NOTE — Assessment & Plan Note (Signed)
Stable. Will follow up with GI soon.

## 2018-05-29 NOTE — Assessment & Plan Note (Signed)
UTD on vaccines. Discussed importance of hand hygiene.

## 2018-05-29 NOTE — Progress Notes (Signed)
Subjective:  Charles [name] is a 47 y.o. male who presents today for his annual comprehensive physical exam.    HPI:  He has no acute complaints today.   His stable, chronic conditions are outlined below:  1. Crohns. Had a few bouts of SBO that resolved spontaneously. Tolerating humira well.   Lifestyle Diet: Limited due to Crohn's disease.  Exercise: Runs regularly.   Depression screen PHQ 2/9 05/29/2018  Decreased Interest 0  Down, Depressed, Hopeless 0  PHQ - 2 Score 0    Health Maintenance Due  Topic Date Due  . HIV Screening  10/18/1987     ROS: Per HPI, otherwise a complete review of systems was negative.   PMH:  The following were reviewed and entered/updated in epic: Past Medical History:  Diagnosis Date  . Allergy   . Arthritis   . Arthropathy    and scroilitis  . Asthma   . Blood transfusion without reported diagnosis   . 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 Crohn's ileocolitis ([place]) 08/29/2008  . Dysplastic nevi   . GERD (gastroesophageal reflux disease)   . Herniated disc   . History of proctitis 2006  . IBD (inflammatory bowel disease)   . Incisional hernia   . Migraine headache   . Morton's neuroma of right foot   . Osteopenia   . Pouchitis ([place]) 2006  . Small bowel obstruction due to adhesions ([place]) 02/09/2015  . Thyroid disease   . Vitamin D deficiency    Patient Active Problem List   Diagnosis Date Noted  . Pain and swelling of left knee 04/14/2018  . Forearm joint pain 07/30/2017  . TFCC (triangular fibrocartilage complex) injury, right, initial encounter 04/17/2017  . Closed fracture of pisiform 04/17/2017  . Anxiety state 07/05/2016  . Carpal tunnel syndrome on right 05/30/2016  . Synovitis of finger 01/11/2016  . Herniated lumbar disc without myelopathy 10/13/2015  . Lumbar back pain with radiculopathy affecting right lower extremity 10/06/2015  . Tibialis posterior tendinitis 08/10/2015  . Os trigonum  syndrome 08/10/2015  . Chronic pain of right ankle 08/01/2015  . Neuroma of third interspace of left foot 04/10/2015  . Small bowel obstruction due to adhesions ([place]) 02/09/2015  . Thoracic back pain 12/06/2014  . Nonallopathic lesion of thoracic region 12/06/2014  . Nonallopathic lesion-rib cage 12/06/2014  . Nonallopathic lesion of lumbosacral region 12/06/2014  . Vitamin D deficiency 08/31/2014  . Loss of transverse plantar arch 07/26/2014  . Hip abductor tendinitis 07/26/2014  . Achilles tendinosis 07/05/2014  . De Quervain's disease (radial styloid tenosynovitis) 05/05/2014  . Morton's neuroma of right foot 01/18/2014  . Abdominal wall hernia 12/27/2013  . Crohn's disease ([place]) 12/27/2013  . Airway hyperreactivity 06/11/2013  . Dysplastic nevus of trunk 05/26/2012  . 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 EPIDERMOID CYST 02/15/2008  . Osteopenia 10/09/2007   Past Surgical History:  Procedure Laterality Date  . COLECTOMY    . COLONOSCOPY W/ BIOPSIES  04/16/2006   crohn's colitis  . FLEXIBLE SIGMOIDOSCOPY  2006 - 02/2014   ileocolitis, pouchitis.  with pouch ulcer.   . FUNCTIONAL ENDOSCOPIC SINUS SURGERY  12/2003   partial ethmoidectomy.  Dr Wilburn Cornelia  . ileoanal pull-through    . LUMBAR LAMINECTOMY/DECOMPRESSION MICRODISCECTOMY Right 10/13/2015   Procedure: Right Lumbar four- five Microdiskectomy;  Surgeon: Erline Levine, MD;  Location: [place];  Service: Neurosurgery;  Laterality: Right;  Right L4-5 Microdiskectomy  . sacroilitis    .  SIGMOIDOSCOPY  01/2005  . VENTRAL HERNIA REPAIR  06/2014    Family History  Problem Relation Age of Onset  . Ulcerative colitis Mother   . Colon cancer Paternal Grandmother   . Brain cancer Paternal Grandmother        mets from colon    Medications- reviewed and updated Current Outpatient Medications  Medication Sig Dispense Refill  . albuterol (PROVENTIL HFA;VENTOLIN HFA) 108 (90 BASE) MCG/ACT  inhaler Inhale 2 puffs into the lungs every 6 (six) hours as needed. 18 g 3  . allopurinol (ZYLOPRIM) 100 MG tablet Take 2 tablets (200 mg total) by mouth daily. 60 tablet 6  . AZASITE 1 % ophthalmic solution     . celecoxib (CELEBREX) 100 MG capsule TAKE 1 CAPSULE BY MOUTH 2 TIMES DAILY AS NEEDED 60 capsule 5  . cholecalciferol (VITAMIN D) 1000 units tablet Take 1,000 Units by mouth daily.    Marland Kitchen HUMIRA PEN 40 MG/0.8ML PNKT INJECT ONE PEN SUBCUTANEOUSLY EVERY OTHER WEEK. REFRIGERATE. 2 each 3  . lansoprazole (PREVACID) 30 MG capsule Take 1 capsule (30 mg total) by mouth 2 (two) times daily. 180 capsule 2  . Probiotic Product (VSL#3 DS) PACK DISSOLVE 1 PACKET IN FLUID AND TAKE EVERY DAY 30 each 2  . rifaximin (XIFAXAN) 550 MG TABS tablet Take 1 tablet (550 mg total) by mouth 3 (three) times daily. 42 tablet 0  . Vitamin D, Ergocalciferol, (DRISDOL) 50000 units CAPS capsule TAKE ONE CAPSULE BY MOUTH EVERY 7 DAYS FOR EIGHT doses 30 capsule 0  . zolmitriptan (ZOMIG-ZMT) 5 MG disintegrating tablet DISSOLVE 1 TABLET BY MOUTH AS NEEDED AS DIRECTED 20 tablet 0   No current facility-administered medications for this visit.     Allergies-reviewed and updated Allergies  Allergen Reactions  . Mercaptopurine Nausea And Vomiting    Felt ill within 2 days of initiating therapy.    Social History   Socioeconomic History  . Marital status: Divorced    Spouse name: Not on file  . Number of children: 2  . Years of education: 16 at least  . Highest education level: Not on file  Occupational History  . Occupation: Surveyor, quantity: [employer]  . Financial resource strain: Not on file  . Food insecurity:    Worry: Not on file    Inability: Not on file  . Transportation needs:    Medical: Not on file    Non-medical: Not on file  Tobacco Use  . Smoking status: Never Smoker  . Smokeless tobacco: Never Used  Substance and Sexual Activity  . Alcohol use: Yes     Alcohol/week: 14.0 standard drinks    Types: 14 Glasses of wine per week    Comment: couple of drinks daily  . Drug use: No  . Sexual activity: Yes  Lifestyle  . Physical activity:    Days per week: Not on file    Minutes per session: Not on file  . Stress: Not on file  Relationships  . Social connections:    Talks on phone: Not on file    Gets together: Not on file    Attends religious service: Not on file    Active member of club or organization: Not on file    Attends meetings of clubs or organizations: Not on file    Relationship status: Not on file  Other Topics Concern  . Not on file  Social History Narrative   Father a pulmonologist in  [place].   Married to Dr. Honor Junes daughter Gerald Stabs - separated 2017   Wife is CRNA   2 sons   + EtOH   No tobacco   No drugs    Objective:  Physical Exam: BP 126/84 (BP Location: Left Arm, Patient Position: Sitting, Cuff Size: Normal)   Pulse 67   Temp 98.6 F (37 C) (Oral)   Ht 5' 10"  (1.778 m)   Wt 160 lb 6.4 oz (72.8 kg)   SpO2 97%   BMI 23.02 kg/m   Body mass index is 23.02 kg/m. Wt Readings from Last 3 Encounters:  05/29/18 160 lb 6.4 oz (72.8 kg)  04/14/18 148 lb (67.1 kg)  08/15/17 162 lb 12.8 oz (73.8 kg)   Gen: NAD, resting comfortably HEENT: TMs normal bilaterally. OP clear. No thyromegaly noted.  CV: RRR with no murmurs appreciated Pulm: NWOB, CTAB with no crackles, wheezes, or rhonchi GI: Normal bowel sounds present. Soft, Nontender, Nondistended. MSK: no edema, cyanosis, or clubbing noted Skin: warm, dry Neuro: CN2-12 grossly intact. Strength 5/5 in upper and lower extremities. Reflexes symmetric and intact bilaterally.  Psych: Normal affect and thought content  Assessment/Plan:  Immunosuppression ([place]) UTD on vaccines. Discussed importance of hand hygiene.   Crohn's disease ([place]) Stable. Will follow up with GI soon.   Preventative Healthcare: Flu shot given today.   Patient Counseling(The following  topics were reviewed and/or handout was given):  -Nutrition: Stressed importance of moderation in sodium/caffeine intake, saturated fat and cholesterol, caloric balance, sufficient intake of fresh fruits, vegetables, and fiber.  -Stressed the importance of regular exercise.   -Substance Abuse: Discussed cessation/primary prevention of tobacco, alcohol, or other drug use; driving or other dangerous activities under the influence; availability of treatment for abuse.   -Injury prevention: Discussed safety belts, safety helmets, smoke detector, smoking near bedding or upholstery.   -Sexuality: Discussed sexually transmitted diseases, partner selection, use of condoms, avoidance of unintended pregnancy and contraceptive alternatives.   -Dental health: Discussed importance of regular tooth brushing, flossing, and dental visits.  -Health maintenance and immunizations reviewed. Please refer to Health maintenance section.  Return to care in 1 year for next preventative visit.   [name]. [name], MD 05/29/2018 11:00 AM

## 2018-05-29 NOTE — Patient Instructions (Signed)
It was very nice to see you today!  Keep up the good work!  No changes today.  Come back to see me in 1 year, or sooner as needed.   Take care, Dr Bret Vanessen   Preventive Care 40-64 Years, Male Preventive care refers to lifestyle choices and visits with your health care provider that can promote health and wellness. What does preventive care include?  A yearly physical exam. This is also called an annual well check.  Dental exams once or twice a year.  Routine eye exams. Ask your health care provider how often you should have your eyes checked.  Personal lifestyle choices, including: ? Daily care of your teeth and gums. ? Regular physical activity. ? Eating a healthy diet. ? Avoiding tobacco and drug use. ? Limiting alcohol use. ? Practicing safe sex. ? Taking low-dose aspirin every day starting at age 50. What happens during an annual well check? The services and screenings done by your health care provider during your annual well check will depend on your age, overall health, lifestyle risk factors, and family history of disease. Counseling Your health care provider may ask you questions about your:  Alcohol use.  Tobacco use.  Drug use.  Emotional well-being.  Home and relationship well-being.  Sexual activity.  Eating habits.  Work and work environment.  Screening You may have the following tests or measurements:  Height, weight, and BMI.  Blood pressure.  Lipid and cholesterol levels. These may be checked every 5 years, or more frequently if you are over 50 years old.  Skin check.  Lung cancer screening. You may have this screening every year starting at age 55 if you have a 30-pack-year history of smoking and currently smoke or have quit within the past 15 years.  Fecal occult blood test (FOBT) of the stool. You may have this test every year starting at age 50.  Flexible sigmoidoscopy or colonoscopy. You may have a sigmoidoscopy every 5 years or a  colonoscopy every 10 years starting at age 50.  Prostate cancer screening. Recommendations will vary depending on your family history and other risks.  Hepatitis C blood test.  Hepatitis B blood test.  Sexually transmitted disease (STD) testing.  Diabetes screening. This is done by checking your blood sugar (glucose) after you have not eaten for a while (fasting). You may have this done every 1-3 years.  Discuss your test results, treatment options, and if necessary, the need for more tests with your health care provider. Vaccines Your health care provider may recommend certain vaccines, such as:  Influenza vaccine. This is recommended every year.  Tetanus, diphtheria, and acellular pertussis (Tdap, Td) vaccine. You may need a Td booster every 10 years.  Varicella vaccine. You may need this if you have not been vaccinated.  Zoster vaccine. You may need this after age 60.  Measles, mumps, and rubella (MMR) vaccine. You may need at least one dose of MMR if you were born in 1957 or later. You may also need a second dose.  Pneumococcal 13-valent conjugate (PCV13) vaccine. You may need this if you have certain conditions and have not been vaccinated.  Pneumococcal polysaccharide (PPSV23) vaccine. You may need one or two doses if you smoke cigarettes or if you have certain conditions.  Meningococcal vaccine. You may need this if you have certain conditions.  Hepatitis A vaccine. You may need this if you have certain conditions or if you travel or work in places where you may be exposed   to hepatitis A.  Hepatitis B vaccine. You may need this if you have certain conditions or if you travel or work in places where you may be exposed to hepatitis B.  Haemophilus influenzae type b (Hib) vaccine. You may need this if you have certain risk factors.  Talk to your health care provider about which screenings and vaccines you need and how often you need them. This information is not intended to  replace advice given to you by your health care provider. Make sure you discuss any questions you have with your health care provider. Document Released: 09/01/2015 Document Revised: 04/24/2016 Document Reviewed: 06/06/2015 Elsevier Interactive Patient Education  [employer].

## 2018-05-31 ENCOUNTER — Encounter: Payer: Self-pay | Admitting: Family Medicine

## 2018-06-01 ENCOUNTER — Other Ambulatory Visit: Payer: Self-pay

## 2018-06-01 MED ORDER — ALBUTEROL SULFATE HFA 108 (90 BASE) MCG/ACT IN AERS: 2.0000 | INHALATION_SPRAY | Freq: Four times a day (QID) | RESPIRATORY_TRACT | 0 refills | Status: DC | PRN

## 2018-06-22 DIAGNOSIS — D225 Melanocytic nevi of trunk: Secondary | ICD-10-CM | POA: Diagnosis not present

## 2018-06-22 DIAGNOSIS — D2271 Melanocytic nevi of right lower limb, including hip: Secondary | ICD-10-CM | POA: Diagnosis not present

## 2018-06-22 DIAGNOSIS — D2261 Melanocytic nevi of right upper limb, including shoulder: Secondary | ICD-10-CM | POA: Diagnosis not present

## 2018-07-27 ENCOUNTER — Other Ambulatory Visit: Payer: Self-pay | Admitting: Internal Medicine

## 2018-08-03 ENCOUNTER — Encounter: Payer: Self-pay | Admitting: Family Medicine

## 2018-08-03 NOTE — Progress Notes (Signed)
[name] [address], [place] [address] Phone: [phone] Subjective:     CC: Left leg pain  LXB:[id number]  Charles [name] is a 47 y.o. male coming in with complaint of left leg pain. Feels like he hit a nerve with an injection that he gives himself in quad. Pain has subsided. Patient would like a repeat on his labs today and body fat assessment.  Patient is responding well to manipulation previously.  Patient's is having more back pain at the moment.  Less radicular symptoms.  Patient denies any weakness of the lower extremities.     Past Medical History:  Diagnosis Date  . Allergy   . Arthritis   . Arthropathy    and scroilitis  . Asthma   . Blood transfusion without reported diagnosis   . 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 Crohn's ileocolitis ([place]) 08/29/2008  . Dysplastic nevi   . GERD (gastroesophageal reflux disease)   . Herniated disc   . History of proctitis 2006  . IBD (inflammatory bowel disease)   . Incisional hernia   . Migraine headache   . Morton's neuroma of right foot   . Osteopenia   . 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 Small bowel obstruction due to adhesions ([place]) 02/09/2015  . Thyroid disease   . Vitamin D deficiency    Past Surgical History:  Procedure Laterality Date  . COLECTOMY    . COLONOSCOPY W/ BIOPSIES  04/16/2006   crohn's colitis  . FLEXIBLE SIGMOIDOSCOPY  2006 - 02/2014   ileocolitis, pouchitis.  with pouch ulcer.   . FUNCTIONAL ENDOSCOPIC SINUS SURGERY  12/2003   partial ethmoidectomy.  Dr Wilburn Cornelia  . ileoanal pull-through    . LUMBAR LAMINECTOMY/DECOMPRESSION MICRODISCECTOMY Right 10/13/2015   Procedure: Right Lumbar four- five Microdiskectomy;  Surgeon: Erline Levine, MD;  Location: [facility];  Service: Neurosurgery;  Laterality: Right;  Right L4-5 Microdiskectomy  . sacroilitis    . SIGMOIDOSCOPY  01/2005  . VENTRAL HERNIA REPAIR  06/2014   Social History   Socioeconomic  History  . Marital status: Divorced    Spouse name: Not on file  . Number of children: 2  . Years of education: 16 at least  . Highest education level: Not on file  Occupational History  . Occupation: Surveyor, quantity: [employer]  . Financial resource strain: Not on file  . Food insecurity:    Worry: Not on file    Inability: Not on file  . Transportation needs:    Medical: Not on file    Non-medical: Not on file  Tobacco Use  . Smoking status: Never Smoker  . Smokeless tobacco: Never Used  Substance and Sexual Activity  . Alcohol use: Yes    Alcohol/week: 14.0 standard drinks    Types: 14 Glasses of wine per week    Comment: couple of drinks daily  . Drug use: No  . Sexual activity: Yes  Lifestyle  . Physical activity:    Days per week: Not on file    Minutes per session: Not on file  . Stress: Not on file  Relationships  . Social connections:    Talks on phone: Not on file    Gets together: Not on file    Attends religious service: Not on file    Active member of club or organization: Not on file    Attends meetings of clubs or organizations:  Not on file    Relationship status: Not on file  Other Topics Concern  . Not on file  Social History Narrative   Father a pulmonologist in [place].   Married to Dr. Honor Junes daughter Gerald Stabs - separated 2017   Wife is CRNA   2 sons   + EtOH   No tobacco   No drugs   Allergies  Allergen Reactions  . Mercaptopurine Nausea And Vomiting    Felt ill within 2 days of initiating therapy.   Family History  Problem Relation Age of Onset  . Ulcerative colitis Mother   . Colon cancer Paternal Grandmother   . Brain cancer Paternal Grandmother        mets from colon      Current Outpatient Medications (Respiratory):  .  albuterol (PROVENTIL HFA;VENTOLIN HFA) 108 (90 Base) MCG/ACT inhaler, Inhale 2 puffs into the lungs every 6 (six) hours as needed.  Current Outpatient Medications  (Analgesics):  .  allopurinol (ZYLOPRIM) 100 MG tablet, Take 2 tablets (200 mg total) by mouth daily. .  celecoxib (CELEBREX) 100 MG capsule, TAKE 1 CAPSULE BY MOUTH 2 TIMES DAILY AS NEEDED .  HUMIRA PEN 40 MG/0.8ML PNKT, INJECT ONE PEN SUBCUTANEOUSLY EVERY OTHER WEEK. REFRIGERATE. Marland Kitchen  zolmitriptan (ZOMIG-ZMT) 5 MG disintegrating tablet, DISSOLVE 1 TABLET BY MOUTH AS NEEDED AS DIRECTED   Current Outpatient Medications (Other):  Marland Kitchen  AZASITE 1 % ophthalmic solution,  .  cholecalciferol (VITAMIN D) 1000 units tablet, Take 1,000 Units by mouth daily. .  lansoprazole (PREVACID) 30 MG capsule, Take 1 capsule (30 mg total) by mouth 2 (two) times daily. .  Probiotic Product (VSL#3 DS) PACK, DISSOLVE 1 PACKET IN FLUID AND TAKE EVERY DAY .  rifaximin (XIFAXAN) 550 MG TABS tablet, Take 1 tablet (550 mg total) by mouth 3 (three) times daily. .  Vitamin D, Ergocalciferol, (DRISDOL) 50000 units CAPS capsule, TAKE ONE CAPSULE BY MOUTH EVERY 7 DAYS FOR EIGHT doses    Past medical history, social, surgical and family history all reviewed in electronic medical record.  No pertanent information unless stated regarding to the chief complaint.   Review of Systems:  No headache, visual changes, nausea, vomiting, diarrhea, constipation, dizziness, abdominal pain, skin rash, fevers, chills, night sweats, weight loss, swollen lymph nodes, body aches, joint swelling, chest pain, shortness of breath, mood changes.  Positive muscle aches  Objective  There were no vitals taken for this visit. Systems examined below as of    General: No apparent distress alert and oriented x3 mood and affect normal, dressed appropriately.  HEENT: Pupils equal, extraocular movements intact  Respiratory: Patient's speak in full sentences and does not appear short of breath  Cardiovascular: No lower extremity edema, non tender, no erythema  Skin: Warm dry intact with no signs of infection or rash on extremities or on axial skeleton.    Abdomen: Soft nontender  Neuro: Cranial nerves II through XII are intact, neurovascularly intact in all extremities with 2+ DTRs and 2+ pulses.  Lymph: No lymphadenopathy of posterior or anterior cervical chain or axillae bilaterally.  Gait normal with good balance and coordination.  MSK:  Non tender with full range of motion and good stability and symmetric strength and tone of shoulders, elbows, wrist, hip, knee and ankles bilaterally.  Back Exam:  Inspection: Unremarkable  Motion: Flexion 35 deg, Extension 25 deg, Side Bending to 35 deg bilaterally,  Rotation to 35 deg bilaterally  SLR laying: Negative  XSLR laying: Negative  Palpable tenderness: Tender  to palpation the paraspinal musculature lumbar spine left greater than right. FABER: Tightness bilaterally. Sensory change: Gross sensation intact to all lumbar and sacral dermatomes.  Reflexes: 2+ at both patellar tendons, 2+ at achilles tendons, Babinski's downgoing.  Strength at foot  Plantar-flexion: 5/5 Dorsi-flexion: 5/5 Eversion: 5/5 Inversion: 5/5  Leg strength  Quad: 5/5 Hamstring: 5/5 Hip flexor: 5/5 Hip abductors: 4/5    Osteopathic findings T3 extended rotated and side bent right inhaled third rib T5 extended rotated and side bent left L3 flexed rotated and side bent right Sacrum right on right     Impression and Recommendations:     This case required medical decision making of moderate complexity. The above documentation has been reviewed and is accurate and complete Lyndal Pulley, DO       Note: This dictation was prepared with Dragon dictation along with smaller phrase technology. Any transcriptional errors that result from this process are unintentional.

## 2018-08-04 ENCOUNTER — Other Ambulatory Visit (INDEPENDENT_AMBULATORY_CARE_PROVIDER_SITE_OTHER): Payer: 59

## 2018-08-04 ENCOUNTER — Encounter: Payer: Self-pay | Admitting: Family Medicine

## 2018-08-04 ENCOUNTER — Ambulatory Visit (INDEPENDENT_AMBULATORY_CARE_PROVIDER_SITE_OTHER): Payer: 59 | Admitting: Family Medicine

## 2018-08-04 VITALS — BP 112/78 | HR 92 | Ht 70.0 in | Wt 156.0 lb

## 2018-08-04 DIAGNOSIS — M2550 Pain in unspecified joint: Secondary | ICD-10-CM | POA: Diagnosis not present

## 2018-08-04 DIAGNOSIS — M999 Biomechanical lesion, unspecified: Secondary | ICD-10-CM | POA: Diagnosis not present

## 2018-08-04 DIAGNOSIS — M5126 Other intervertebral disc displacement, lumbar region: Secondary | ICD-10-CM | POA: Diagnosis not present

## 2018-08-04 LAB — IBC PANEL
Iron: 45 ug/dL (ref 42–165)
Saturation Ratios: 10.4 % — ABNORMAL LOW (ref 20.0–50.0)
Transferrin: 309 mg/dL (ref 212.0–360.0)

## 2018-08-04 LAB — VITAMIN B12: Vitamin B-12: 333 pg/mL (ref 211–911)

## 2018-08-04 LAB — FERRITIN: Ferritin: 9.3 ng/mL — ABNORMAL LOW (ref 22.0–322.0)

## 2018-08-04 LAB — TESTOSTERONE: Testosterone: 668.46 ng/dL (ref 300.00–890.00)

## 2018-08-04 LAB — VITAMIN D 25 HYDROXY (VIT D DEFICIENCY, FRACTURES): VITD: 34.29 ng/mL (ref 30.00–100.00)

## 2018-08-04 NOTE — Assessment & Plan Note (Signed)
History of this previously.  Has been responding well with male at this time from conservative therapy.  Has been quite sometime since last seen patient.  Patient did respond well to manipulation again.  Discussed once again posture we discussed different ergonomic changes throughout the day.  Patient has been increasing his activity and has been running a little bit more.  Encouraged him to continue to do so as long as he feels good but make sure he does do some stretching afterwards.  Patient is in agreement with the plan.  Follow-up again in 4 to 8 weeks

## 2018-08-04 NOTE — Patient Instructions (Signed)
Good to see you  Overall not bad 8% fat- not too shabby  Labs downstairs Lets watch the leg but should do well  Consider taking the B complex more regularly.  See me again in 8 week sit you need me Happy holidays!

## 2018-08-04 NOTE — Assessment & Plan Note (Signed)
Decision today to treat with OMT was based on Physical Exam  After verbal consent patient was treated with HVLA, ME, FPR techniques in , thoracic, lumbar and sacral areas  Patient tolerated the procedure well with improvement in symptoms  Patient given exercises, stretches and lifestyle modifications  See medications in patient instructions if given  Patient will follow up in 4 weeks

## 2018-08-20 ENCOUNTER — Other Ambulatory Visit: Payer: Self-pay

## 2018-08-20 DIAGNOSIS — K50018 Crohn's disease of small intestine with other complication: Secondary | ICD-10-CM

## 2018-08-21 ENCOUNTER — Other Ambulatory Visit: Payer: 59

## 2018-08-21 DIAGNOSIS — K50018 Crohn's disease of small intestine with other complication: Secondary | ICD-10-CM

## 2018-08-23 LAB — QUANTIFERON-TB GOLD PLUS
Mitogen-NIL: >10 IU/mL
NIL: 0.04 IU/mL
QuantiFERON-TB Gold Plus: NEGATIVE
TB1-NIL: 0.01 IU/mL
TB2-NIL: 0.01 IU/mL

## 2018-08-23 NOTE — Progress Notes (Signed)
Quantiferon is negative He needs to set up a fd/u visit - not urgent

## 2018-09-25 ENCOUNTER — Encounter: Payer: Self-pay | Admitting: Internal Medicine

## 2018-09-25 ENCOUNTER — Ambulatory Visit (INDEPENDENT_AMBULATORY_CARE_PROVIDER_SITE_OTHER): Payer: 59 | Admitting: Internal Medicine

## 2018-09-25 VITALS — BP 108/70 | HR 88 | Ht 69.25 in | Wt 158.5 lb

## 2018-09-25 DIAGNOSIS — K219 Gastro-esophageal reflux disease without esophagitis: Secondary | ICD-10-CM

## 2018-09-25 DIAGNOSIS — K5010 Crohn's disease of large intestine without complications: Secondary | ICD-10-CM

## 2018-09-25 DIAGNOSIS — D508 Other iron deficiency anemias: Secondary | ICD-10-CM

## 2018-09-25 DIAGNOSIS — K91850 Pouchitis: Secondary | ICD-10-CM | POA: Diagnosis not present

## 2018-09-25 DIAGNOSIS — D649 Anemia, unspecified: Secondary | ICD-10-CM | POA: Insufficient documentation

## 2018-09-25 MED ORDER — ADALIMUMAB 40 MG/0.8ML ~~LOC~~ AJKT: 1.0000 "pen " | AUTO-INJECTOR | SUBCUTANEOUS | 5 refills | Status: DC

## 2018-09-25 NOTE — Assessment & Plan Note (Signed)
Doing well on lansoprazole, continue

## 2018-09-25 NOTE — Assessment & Plan Note (Addendum)
Doing well, continue Humira

## 2018-09-25 NOTE — Assessment & Plan Note (Signed)
Looking back he has been microcytic for some time and I wonder if he is not had a borderline anemia and iron deficiency for a while.  We discussed whether or not he should have an upper GI endoscopy.  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  I do not think that will be helpful.  I want to look at his labs from the integrative medicine practice, and once I review those I will contact him about whether or not we need additional work-up.

## 2018-09-25 NOTE — Progress Notes (Signed)
Charles [name] 48 y.o. 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  We discussed whether or not he should have an upper GI endoscopy.  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  I do not think that will be helpful.  I want to look at his labs from the integrative medicine practice, and once I review those I will contact him about whether or not we need additional work-up.     Subjective:   Chief Complaint: Follow-up of Crohn's, pouchitis and GERD  HPI Mat is here for follow-up having been seen a little over a year ago when I examined his J-pouch with the scope.  He had slight pouchitis at that time.  He remains on Humira without difficulty.  He has seen Dr. Charlann Boxer for musculoskeletal issues, his ferritin was found to be low, he had been going to [facility] in [place] and though he did not pursue many of their recommendations he was able to get iron infusions and has felt much better since getting those, last having had one in January 2020.  He is not sure which labs they drew on him in 2019.  I do not have those records at this time.  He denies any bleeding and he is not a blood donor.  He eats iron rich foods.  Ferritin was 22 in October of last year and 9.3 in December.  Hemoglobin 11.8 in June 2018.  Do not have a hemoglobin since then.  MCV was 74 and hemoglobin was 11 in 2016.  His GERD is doing well on lansoprazole 30 mg twice daily some days he does not need it twice a day.  Wt Readings from Last 3 Encounters:  09/25/18 158 lb 8 oz (71.9 kg)  08/04/18 156 lb (70.8 kg)    05/29/18 160 lb 6.4 oz (72.8 kg)   He has not tolerated oral iron in the past, though he is taking some liquid iron supplement that he got [facility] [employer], it probably has some B12 in it as well.  So far he is tolerating that. Allergies  Allergen Reactions  . Mercaptopurine Nausea And Vomiting    Felt ill within 2 days of initiating therapy.   Current Meds  Medication Sig  . Adalimumab (HUMIRA PEN) 40 MG/0.8ML PNKT Inject 1 pen into the skin every 14 (fourteen) days.  Marland Kitchen albuterol (PROVENTIL HFA;VENTOLIN HFA) 108 (90 Base) MCG/ACT inhaler Inhale 2 puffs into the lungs every 6 (six) hours as needed.  . AMBULATORY NON FORMULARY MEDICATION Inject into the vein. Iron Infusion  monthly  . celecoxib (CELEBREX) 100 MG capsule TAKE 1 CAPSULE BY MOUTH 2 TIMES DAILY AS NEEDED  . cholecalciferol (VITAMIN D) 1000 units tablet Take 1,000 Units by mouth daily.  . lansoprazole (PREVACID) 30 MG capsule Take 1 capsule (30 mg total) by mouth 2 (two) times daily.  Marland Kitchen zolmitriptan (ZOMIG-ZMT) 5 MG disintegrating tablet DISSOLVE 1 TABLET BY MOUTH AS NEEDED AS DIRECTED   Past Medical History:  Diagnosis Date  . Allergy   . Arthritis   . Arthropathy    and scroilitis  . Asthma   .  Blood transfusion without reported diagnosis   . 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 Crohn's ileocolitis ([place]) 08/29/2008  . Dysplastic nevi   . GERD (gastroesophageal reflux disease)   . Herniated disc   . History of proctitis 2006  . IBD (inflammatory bowel disease)   . Incisional hernia   . Migraine headache   . Morton's neuroma of right foot   . Osteopenia   . 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 Small bowel obstruction due to adhesions ([place]) 02/09/2015  . Thyroid disease   . Vitamin D deficiency    Past Surgical History:  Procedure Laterality Date  . COLECTOMY    . COLONOSCOPY W/ BIOPSIES  04/16/2006   crohn's colitis  . FLEXIBLE SIGMOIDOSCOPY  2006 - 02/2014   ileocolitis, pouchitis.  with  pouch ulcer.   . FUNCTIONAL ENDOSCOPIC SINUS SURGERY  12/2003   partial ethmoidectomy.  Dr Wilburn Cornelia  . ileoanal pull-through    . LUMBAR LAMINECTOMY/DECOMPRESSION MICRODISCECTOMY Right 10/13/2015   Procedure: Right Lumbar four- five Microdiskectomy;  Surgeon: Erline Levine, MD;  Location: [facility];  Service: Neurosurgery;  Laterality: Right;  Right L4-5 Microdiskectomy  . sacroilitis    . SIGMOIDOSCOPY  01/2005  . VENTRAL HERNIA REPAIR  06/2014   Social History   Social History Narrative   Father a Technical brewer in [place].   Married to Dr. Honor Junes daughter Gerald Stabs - separated 2017   Wife is CRNA   2 sons   + EtOH   No tobacco   No drugs   family history includes Brain cancer in his paternal grandmother; Colon cancer in his paternal grandmother; Ulcerative colitis in his mother.   Review of Systems See HPI  Objective:   Physical Exam BP 108/70 (BP Location: Left Arm, Patient Position: Sitting, Cuff Size: Normal)   Pulse 88   Ht 5' 9.25" (1.759 m)   Wt 158 lb 8 oz (71.9 kg)   BMI 23.24 kg/m  NAD Eyes anicteric Lungs CTA Cor NL Abd thin, midline scar NT, no HSM/mass

## 2018-09-25 NOTE — Patient Instructions (Signed)
We are going to get your records from [facility] for review.   We have refilled your Humira for 6 months.   We will be in touch after reviewing your records.    I appreciate the opportunity to care for you. Silvano Rusk, MD, [facility]

## 2018-11-09 ENCOUNTER — Encounter: Payer: Self-pay | Admitting: Family Medicine

## 2018-11-10 ENCOUNTER — Encounter: Payer: Self-pay | Admitting: Family Medicine

## 2018-11-10 ENCOUNTER — Ambulatory Visit (INDEPENDENT_AMBULATORY_CARE_PROVIDER_SITE_OTHER): Payer: 59 | Admitting: Family Medicine

## 2018-11-10 DIAGNOSIS — J309 Allergic rhinitis, unspecified: Secondary | ICD-10-CM

## 2018-11-10 DIAGNOSIS — J4520 Mild intermittent asthma, uncomplicated: Secondary | ICD-10-CM

## 2018-11-10 MED ORDER — AZELASTINE HCL 0.1 % NA SOLN: 2.0000 | Freq: Two times a day (BID) | NASAL | 12 refills | Status: DC

## 2018-11-10 MED ORDER — BUDESONIDE-FORMOTEROL FUMARATE 160-4.5 MCG/ACT IN AERO: 1.0000 | INHALATION_SPRAY | Freq: Two times a day (BID) | RESPIRATORY_TRACT | 12 refills | Status: DC

## 2018-11-10 NOTE — Assessment & Plan Note (Addendum)
New problem to provider.  No red flags.  Continue over-the-counter Allegra.  Start Astelin nasal spray 2 sprays twice daily.  Continue over-the-counter Flonase as needed.

## 2018-11-10 NOTE — Progress Notes (Signed)
    Chief Complaint:  Charles [name] is a 48 y.o. male who presents today for a virtual office visit with a chief complaint of rhinorrhea.   Assessment/Plan:  Allergic rhinitis New problem to provider.  No red flags.  Continue over-the-counter Allegra.  Start Astelin nasal spray 2 sprays twice daily.  Continue over-the-counter Flonase as needed.  Airway hyperreactivity No red flags.  Start Symbicort 1 puff twice daily.  Continue albuterol as needed.  Discussed reasons to return to care and seek emergent care.  Follow-up as needed.    Subjective:  HPI:  Rhinorrhea Started several days ago. Worsening over that time. Associated with chest congestion and post nasal drip. He has tried using claritin and allegra which seem to help. No fevers.  No known sick contacts.  No shortness of breath.  No chest pain.  He has had similar episodes in the past which has resolved with inhaled corticosteroids and beta agonists.  No other obvious alleviating or aggravating factors.  ROS: Per HPI  PMH: He reports that he has never smoked. He has never used smokeless tobacco. He reports current alcohol use of about 14.0 standard drinks of alcohol per week. He reports that he does not use drugs.      Objective/Observations  Physical Exam: Gen: NAD, resting comfortably Pulm: Normal work of breathing Neuro: Grossly normal, moves all extremities Psych: Normal affect and thought content  Virtual Visit via Video   I connected with Charles [name] on 11/10/18 at 11:20 AM EDT by a video enabled telemedicine application and verified that I am speaking with the correct person using two identifiers. I discussed the limitations of evaluation and management by telemedicine and the availability of in person appointments. The patient expressed understanding and agreed to proceed.   Patient location: Home Provider location: [facility] [facility] participating in the virtual visit: Myself and patient      [name]. [name], MD 11/10/2018 12:03 PM

## 2018-11-10 NOTE — Assessment & Plan Note (Signed)
No red flags.  Start Symbicort 1 puff twice daily.  Continue albuterol as needed.  Discussed reasons to return to care and seek emergent care.  Follow-up as needed.

## 2019-01-04 DIAGNOSIS — D2261 Melanocytic nevi of right upper limb, including shoulder: Secondary | ICD-10-CM | POA: Diagnosis not present

## 2019-01-04 DIAGNOSIS — D225 Melanocytic nevi of trunk: Secondary | ICD-10-CM | POA: Diagnosis not present

## 2019-01-04 DIAGNOSIS — D2239 Melanocytic nevi of other parts of face: Secondary | ICD-10-CM | POA: Diagnosis not present

## 2019-02-08 ENCOUNTER — Other Ambulatory Visit: Payer: Self-pay | Admitting: Internal Medicine

## 2019-02-12 ENCOUNTER — Encounter: Payer: Self-pay | Admitting: Family Medicine

## 2019-02-12 ENCOUNTER — Other Ambulatory Visit: Payer: Self-pay

## 2019-02-12 ENCOUNTER — Ambulatory Visit (INDEPENDENT_AMBULATORY_CARE_PROVIDER_SITE_OTHER): Payer: 59 | Admitting: Family Medicine

## 2019-02-12 VITALS — BP 122/72 | HR 93 | Ht 69.25 in

## 2019-02-12 DIAGNOSIS — M5416 Radiculopathy, lumbar region: Secondary | ICD-10-CM | POA: Diagnosis not present

## 2019-02-12 DIAGNOSIS — M999 Biomechanical lesion, unspecified: Secondary | ICD-10-CM | POA: Diagnosis not present

## 2019-02-12 MED ORDER — METHYLPREDNISOLONE ACETATE 80 MG/ML IJ SUSP
80.0000 mg | Freq: Once | INTRAMUSCULAR | Status: AC
  Administered 2019-02-12: 80 mg via INTRAMUSCULAR

## 2019-02-12 MED ORDER — TIZANIDINE HCL 4 MG PO TABS: 4.0000 mg | ORAL_TABLET | Freq: Three times a day (TID) | ORAL | 2 refills | Status: AC | PRN

## 2019-02-12 MED ORDER — KETOROLAC TROMETHAMINE 60 MG/2ML IM SOLN
60.0000 mg | Freq: Once | INTRAMUSCULAR | Status: AC
  Administered 2019-02-12: 60 mg via INTRAMUSCULAR

## 2019-02-12 NOTE — Patient Instructions (Addendum)
Good to see you  Sorry for the pain  2 injections today  zanaflex up to 3 times a day  Ice 20 minutes 2 times daily. Usually after activity and before bed. pennsaid pinkie amount topically 2 times daily as needed.  See me again in 2 weeks if not better

## 2019-02-12 NOTE — Progress Notes (Signed)
[name] [address] [place],  [place] [address] Phone: [phone] Subjective:   Charles [name], am serving as a scribe for Dr. Hulan Saas.  I'm seeing this patient by the request  of:    CC: Back pain follow-up  LDJ:[id number]  Charles [name] is a 48 y.o. male coming in with complaint of back pain. Patient states that last night he bent over to move a boxwood tree and felt a pull in right glute. Feels like patient is having an injury.  More severe likely muscle.  Mild radiation down the leg intermittently.  [name] weakness.  History of the herniated disc but does not feel that bad.    Past Medical History:  Diagnosis Date  . Allergy   . Arthritis   . Arthropathy    and scroilitis  . Asthma   . Blood transfusion without reported diagnosis   . Chronic drug-induced interstitial lung disorders ([place])   . Crohn's ileocolitis ([place]) 08/29/2008  . Dysplastic nevi   . GERD (gastroesophageal reflux disease)   . Herniated disc   . History of proctitis 2006  . IBD (inflammatory bowel disease)   . Incisional hernia   . Migraine headache   . Morton's neuroma of right foot   . Osteopenia   . Pouchitis ([place]) 2006  . Small bowel obstruction due to adhesions ([place]) 02/09/2015  . Thyroid disease   . Vitamin D deficiency    Past Surgical History:  Procedure Laterality Date  . COLECTOMY    . COLONOSCOPY W/ BIOPSIES  04/16/2006   crohn's colitis  . FLEXIBLE SIGMOIDOSCOPY  2006 - 02/2014   ileocolitis, pouchitis.  with pouch ulcer.   . FUNCTIONAL ENDOSCOPIC SINUS SURGERY  12/2003   partial ethmoidectomy.  Dr Wilburn Cornelia  . ileoanal pull-through    . LUMBAR LAMINECTOMY/DECOMPRESSION MICRODISCECTOMY Right 10/13/2015   Procedure: Right Lumbar four- five Microdiskectomy;  Surgeon: Erline Levine, MD;  Location: [facility];  Service: Neurosurgery;  Laterality: Right;  Right L4-5 Microdiskectomy  . sacroilitis    . SIGMOIDOSCOPY  01/2005  . VENTRAL HERNIA REPAIR  06/2014    Social History   Socioeconomic History  . Marital status: Divorced    Spouse name: Not on file  . Number of children: 2  . Years of education: 16 at least  . Highest education level: Not on file  Occupational History  . Occupation: Surveyor, quantity: [employer]  . Financial resource strain: Not on file  . Food insecurity    Worry: Not on file    Inability: Not on file  . Transportation needs    Medical: Not on file    Non-medical: Not on file  Tobacco Use  . Smoking status: Never Smoker  . Smokeless tobacco: Never Used  Substance and Sexual Activity  . Alcohol use: Yes    Alcohol/week: 14.0 standard drinks    Types: 14 Glasses of wine per week    Comment: couple of drinks daily  . Drug use: [name]  . Sexual activity: Yes  Lifestyle  . Physical activity    Days per week: Not on file    Minutes per session: Not on file  . Stress: Not on file  Relationships  . Social Herbalist on phone: Not on file    Gets together: Not on file    Attends religious service: Not on file    Active member of club or  organization: Not on file    Attends meetings of clubs or organizations: Not on file    Relationship status: Not on file  Other Topics Concern  . Not on file  Social History Narrative   Father a pulmonologist in [place].   Married to Dr. Honor Junes daughter Gerald Stabs - separated 2017   Wife is CRNA   2 sons   + EtOH   [name] tobacco   [name] drugs   Allergies  Allergen Reactions  . Mercaptopurine Nausea And Vomiting    Felt ill within 2 days of initiating therapy.   Family History  Problem Relation Age of Onset  . Ulcerative colitis Mother   . Colon cancer Paternal Grandmother   . Brain cancer Paternal Grandmother        mets from colon      Current Outpatient Medications (Respiratory):  .  albuterol (PROVENTIL HFA;VENTOLIN HFA) 108 (90 Base) MCG/ACT inhaler, Inhale 2 puffs into the lungs every 6 (six) hours as needed. Marland Kitchen   azelastine (ASTELIN) 0.1 % nasal spray, Place 2 sprays into both nostrils 2 (two) times daily. Use in each nostril as directed .  budesonide-formoterol (SYMBICORT) 160-4.5 MCG/ACT inhaler, Inhale 1 puff into the lungs 2 (two) times daily.  Current Outpatient Medications (Analgesics):  .  celecoxib (CELEBREX) 100 MG capsule, TAKE 1 CAPSULE BY MOUTH 2 TIMES DAILY AS NEEDED .  HUMIRA PEN 40 MG/0.8ML PNKT, INJECT ONE PEN SUBCUTANEOUSLY EVERY OTHER WEEK. REFRIGERATE. Marland Kitchen  zolmitriptan (ZOMIG-ZMT) 5 MG disintegrating tablet, DISSOLVE 1 TABLET BY MOUTH AS NEEDED AS DIRECTED   Current Outpatient Medications (Other):  Marland Kitchen  AMBULATORY NON FORMULARY MEDICATION, Inject into the vein. Iron Infusion  monthly .  cholecalciferol (VITAMIN D) 1000 units tablet, Take 1,000 Units by mouth daily. .  lansoprazole (PREVACID) 30 MG capsule, Take 1 capsule (30 mg total) by mouth 2 (two) times daily. .  Probiotic Product (VSL#3 DS) PACK, DISSOLVE 1 PACKET IN FLUID AND TAKE EVERY DAY .  tiZANidine (ZANAFLEX) 4 MG tablet, Take 1 tablet (4 mg total) by mouth every 8 (eight) hours as needed for up to 10 days for muscle spasms.    Past medical history, social, surgical and family history all reviewed in electronic medical record.  [name] pertanent information unless stated regarding to the chief complaint.   Review of Systems:  [name] headache, visual changes, nausea, vomiting, diarrhea, constipation, dizziness, abdominal pain, skin rash, fevers, chills, night sweats, weight loss, swollen lymph nodes, body aches, joint swelling,  chest pain, shortness of breath, mood changes.  Positive muscle aches  Objective  Blood pressure 122/72, pulse 93, height 5' 9.25" (1.759 m), SpO2 98 %.    General: [name] apparent distress alert and oriented x3 mood and affect normal, dressed appropriately.  HEENT: Pupils equal, extraocular movements intact  Respiratory: Patient's speak in full sentences and does not appear short of breath  Cardiovascular:  [name] lower extremity edema, non tender, [name] erythema  Skin: Warm dry intact with [name] signs of infection or rash on extremities or on axial skeleton.  Abdomen: Soft nontender  Neuro: Cranial nerves II through XII are intact, neurovascularly intact in all extremities with 2+ DTRs and 2+ pulses.  Lymph: [name] lymphadenopathy of posterior or anterior cervical chain or axillae bilaterally.  Gait normal with good balance and coordination.  MSK:  Non tender with full range of motion and good stability and symmetric strength and tone of shoulders, elbows, wrist, hip, knee and ankles bilaterally.  Patient's back does have some  tightness.  Paraspinal musculature in the thoracolumbar and lumbosacral area.  Right greater than left.  Negative straight leg test but does have tightness of the hamstrings bilaterally.  Mild positive Corky Sox on the right side as well.  Osteopathic findings  C6 flexed rotated and side bent left T5 extended rotated and side bent right inhaled third rib T7 extended rotated and side bent left L2 flexed rotated and side bent right Sacrum right on right    Impression and Recommendations:     This case required medical decision making of moderate complexity. The above documentation has been reviewed and is accurate and complete Lyndal Pulley, DO       Note: This dictation was prepared with Dragon dictation along with smaller phrase technology. Any transcriptional errors that result from this process are unintentional.        ;t

## 2019-02-12 NOTE — Assessment & Plan Note (Signed)
Decision today to treat with OMT was based on Physical Exam  After verbal consent patient was treated with HVLA, ME, FPR techniques in cervical, thoracic, rib lumbar and sacral areas  Patient tolerated the procedure well with improvement in symptoms  Patient given exercises, stretches and lifestyle modifications  See medications in patient instructions if given  Patient will follow up in 4-8 weeks 

## 2019-02-12 NOTE — Assessment & Plan Note (Signed)
Likely patient is not having any significant difficulty abdomen Just more tightness.  No radicular symptoms.  We discussed if any weakness occurs we may need to change this.  Discussed the gabapentin, Zanaflex as a muscle relaxer, 2 injections today that I think would be beneficial.  Attempted osteopathic manipulation.  Follow-up again in 2 weeks

## 2019-02-23 NOTE — Progress Notes (Signed)
[name] [address] [place], [place] [address] Phone: [phone] Subjective:   Fontaine No, am serving as a scribe for Dr. Hulan Saas.   CC: Low back pain follow-up  GEX:[id number]  Charles [name] is a 48 y.o. male coming in with complaint of back pain. Last seen on 02/12/2019 for OMT. Is better but still having some pain. Patient did trying swimming and felt some tenderness in the lower back. Did have massage on Monday morning which has alleviated some of his pain as well.  Patient would state about 85% better.  Taking a muscle relaxer at night.  Did take the Celebrex for short-term.    Past Medical History:  Diagnosis Date  . Allergy   . Arthritis   . Arthropathy    and scroilitis  . Asthma   . Blood transfusion without reported diagnosis   . 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 Crohn's ileocolitis ([place]) 08/29/2008  . Dysplastic nevi   . GERD (gastroesophageal reflux disease)   . Herniated disc   . History of proctitis 2006  . IBD (inflammatory bowel disease)   . Incisional hernia   . Migraine headache   . Morton's neuroma of right foot   . Osteopenia   . Pouchitis ([place]) 2006  . Small bowel obstruction due to adhesions ([place]) 02/09/2015  . Thyroid disease   . Vitamin D deficiency    Past Surgical History:  Procedure Laterality Date  . COLECTOMY    . COLONOSCOPY W/ BIOPSIES  04/16/2006   crohn's colitis  . FLEXIBLE SIGMOIDOSCOPY  2006 - 02/2014   ileocolitis, pouchitis.  with pouch ulcer.   . FUNCTIONAL ENDOSCOPIC SINUS SURGERY  12/2003   partial ethmoidectomy.  Dr Wilburn Cornelia  . ileoanal pull-through    . LUMBAR LAMINECTOMY/DECOMPRESSION MICRODISCECTOMY Right 10/13/2015   Procedure: Right Lumbar four- five Microdiskectomy;  Surgeon: Erline Levine, MD;  Location: [facility];  Service: Neurosurgery;  Laterality: Right;  Right L4-5 Microdiskectomy  . sacroilitis    . SIGMOIDOSCOPY  01/2005  . VENTRAL HERNIA REPAIR   06/2014   Social History   Socioeconomic History  . Marital status: Divorced    Spouse name: Not on file  . Number of children: 2  . Years of education: 16 at least  . Highest education level: Not on file  Occupational History  . Occupation: Surveyor, quantity: [employer]  . Financial resource strain: Not on file  . Food insecurity    Worry: Not on file    Inability: Not on file  . Transportation needs    Medical: Not on file    Non-medical: Not on file  Tobacco Use  . Smoking status: Never Smoker  . Smokeless tobacco: Never Used  Substance and Sexual Activity  . Alcohol use: Yes    Alcohol/week: 14.0 standard drinks    Types: 14 Glasses of wine per week    Comment: couple of drinks daily  . Drug use: No  . Sexual activity: Yes  Lifestyle  . Physical activity    Days per week: Not on file    Minutes per session: Not on file  . Stress: Not on file  Relationships  . Social Herbalist on phone: Not on file    Gets together: Not on file    Attends religious service: Not on file    Active member of club or organization: Not on  file    Attends meetings of clubs or organizations: Not on file    Relationship status: Not on file  Other Topics Concern  . Not on file  Social History Narrative   Father a pulmonologist in [place].   Married to Dr. Honor Junes daughter Gerald Stabs - separated 2017   Wife is CRNA   2 sons   + EtOH   No tobacco   No drugs   Allergies  Allergen Reactions  . Mercaptopurine Nausea And Vomiting    Felt ill within 2 days of initiating therapy.   Family History  Problem Relation Age of [place]  . Ulcerative colitis Mother   . Colon cancer Paternal Grandmother   . Brain cancer Paternal Grandmother        mets from colon      Current Outpatient Medications (Respiratory):  .  albuterol (PROVENTIL HFA;VENTOLIN HFA) 108 (90 Base) MCG/ACT inhaler, Inhale 2 puffs into the lungs every 6 (six) hours as needed.  Marland Kitchen  azelastine (ASTELIN) 0.1 % nasal spray, Place 2 sprays into both nostrils 2 (two) times daily. Use in each nostril as directed .  budesonide-formoterol (SYMBICORT) 160-4.5 MCG/ACT inhaler, Inhale 1 puff into the lungs 2 (two) times daily.  Current Outpatient Medications (Analgesics):  .  celecoxib (CELEBREX) 100 MG capsule, TAKE 1 CAPSULE BY MOUTH 2 TIMES DAILY AS NEEDED .  HUMIRA PEN 40 MG/0.8ML PNKT, INJECT ONE PEN SUBCUTANEOUSLY EVERY OTHER WEEK. REFRIGERATE. Marland Kitchen  zolmitriptan (ZOMIG-ZMT) 5 MG disintegrating tablet, DISSOLVE 1 TABLET BY MOUTH AS NEEDED AS DIRECTED   Current Outpatient Medications (Other):  Marland Kitchen  AMBULATORY NON FORMULARY MEDICATION, Inject into the vein. Iron Infusion  monthly .  cholecalciferol (VITAMIN D) 1000 units tablet, Take 1,000 Units by mouth daily. .  lansoprazole (PREVACID) 30 MG capsule, Take 1 capsule (30 mg total) by mouth 2 (two) times daily. .  Probiotic Product (VSL#3 DS) PACK, DISSOLVE 1 PACKET IN FLUID AND TAKE EVERY DAY    Past medical history, social, surgical and family history all reviewed in electronic medical record.  No pertanent information unless stated regarding to the chief complaint.   Review of Systems:  No headache, visual changes, nausea, vomiting, diarrhea, constipation, dizziness, abdominal pain, skin rash, fevers, chills, night sweats, weight loss, swollen lymph nodes, body aches, joint swelling, muscle aches, chest pain, shortness of breath, mood changes.   Objective  Blood pressure 132/86, pulse 75, height 5' 9"  (1.753 m), weight 146 lb (66.2 kg), SpO2 98 %.   General: No apparent distress alert and oriented x3 mood and affect normal, dressed appropriately.  HEENT: Pupils equal, extraocular movements intact  Respiratory: Patient's speak in full sentences and does not appear short of breath  Cardiovascular: No lower extremity edema, non tender, no erythema  Skin: Warm dry intact with no signs of infection or rash on extremities or  on axial skeleton.  Abdomen: Soft nontender  Neuro: Cranial nerves II through XII are intact, neurovascularly intact in all extremities with 2+ DTRs and 2+ pulses.  Lymph: No lymphadenopathy of posterior or anterior cervical chain or axillae bilaterally.  Gait normal with good balance and coordination.  MSK:  Non tender with full range of motion and good stability and symmetric strength and tone of shoulders, elbows, wrist, hip, knee and ankles bilaterally.  Neck exam is very mild loss lordosis.  Some tenderness to palpation in the L5 and S1 area bilaterally.  Negative straight leg test.  4+ out of 5 strength of the dorsiflexion of the  right foot compared to contralateral side but seems to be about stable for patient.  Osteopathic findings C2 flexed rotated and side bent right T5-6 extended rotated and side bent right L4 flexed rotated and side bent left Sacrum right on right    Impression and Recommendations:     This case required medical decision making of moderate complexity. The above documentation has been reviewed and is accurate and complete Lyndal Pulley, DO       Note: This dictation was prepared with Dragon dictation along with smaller phrase technology. Any transcriptional errors that result from this process are unintentional.

## 2019-02-24 ENCOUNTER — Other Ambulatory Visit: Payer: Self-pay

## 2019-02-24 ENCOUNTER — Ambulatory Visit (INDEPENDENT_AMBULATORY_CARE_PROVIDER_SITE_OTHER): Payer: 59 | Admitting: Family Medicine

## 2019-02-24 ENCOUNTER — Encounter: Payer: Self-pay | Admitting: Family Medicine

## 2019-02-24 VITALS — BP 132/86 | HR 75 | Ht 69.0 in | Wt 146.0 lb

## 2019-02-24 DIAGNOSIS — M999 Biomechanical lesion, unspecified: Secondary | ICD-10-CM | POA: Diagnosis not present

## 2019-02-24 DIAGNOSIS — M5126 Other intervertebral disc displacement, lumbar region: Secondary | ICD-10-CM | POA: Diagnosis not present

## 2019-02-24 NOTE — Assessment & Plan Note (Signed)
Questionable herniated disc but no radicular symptoms at this point.  No weakness of the lower extremity.  Responded well to osteopathic manipulation.  Discussed which activities of doing which wants to avoid.  Patient will increase activity overall.  Follow-up again in 4 to 8 weeks

## 2019-02-24 NOTE — Patient Instructions (Signed)
Celebrex 2x a day for 3 days Muscle relaxar as needed Increase activity as tolerated  See me again in 3 weeks

## 2019-02-24 NOTE — Assessment & Plan Note (Signed)
Decision today to treat with OMT was based on Physical Exam  After verbal consent patient was treated with HVLA, ME, FPR techniques in  Thoracic, rib lumbar and sacral areas  Patient tolerated the procedure well with improvement in symptoms  Patient given exercises, stretches and lifestyle modifications  See medications in patient instructions if given  Patient will follow up in 4-8 weeks

## 2019-03-18 ENCOUNTER — Ambulatory Visit (INDEPENDENT_AMBULATORY_CARE_PROVIDER_SITE_OTHER): Payer: 59 | Admitting: Family Medicine

## 2019-03-18 ENCOUNTER — Other Ambulatory Visit: Payer: Self-pay

## 2019-03-18 ENCOUNTER — Encounter: Payer: Self-pay | Admitting: Family Medicine

## 2019-03-18 VITALS — BP 116/84 | HR 105 | Ht 69.0 in | Wt 146.0 lb

## 2019-03-18 DIAGNOSIS — M5416 Radiculopathy, lumbar region: Secondary | ICD-10-CM

## 2019-03-18 DIAGNOSIS — M999 Biomechanical lesion, unspecified: Secondary | ICD-10-CM

## 2019-03-18 NOTE — Assessment & Plan Note (Signed)
G responding fairly well to osteopathic manipulation.  Patient is finally made some progress and can actually decrease the amount of frequency to more 6 to 8 weeks.  I believe patient will do well with conservative therapy at this point.

## 2019-03-18 NOTE — Assessment & Plan Note (Signed)
Decision today to treat with OMT was based on Physical Exam  After verbal consent patient was treated with HVLA, ME, FPR techniques in cervical, thoracic, rib, lumbar and sacral areas  Patient tolerated the procedure well with improvement in symptoms  Patient given exercises, stretches and lifestyle modifications  See medications in patient instructions if given  Patient will follow up in 6-8 weeks 

## 2019-03-18 NOTE — Progress Notes (Signed)
[facility] [name], [place] [address] Phone: [phone] Subjective:   Charles [name], am serving as a scribe for Dr. Hulan Saas.  I'm seeing this patient by the request  of:    CC: Back pain follow-up  QBH:[id number]  Charles [name] is a 48 y.o. male coming in with complaint of back pain. Has been doing well since last visit. [name] change in his back pain. Is swimming without pain.  Patient was even able to play 36 over golf this weekend with [name] significant discomfort.  Patient has been increasing activity overall.  Denies any numbness or tingling.     Past Medical History:  Diagnosis Date  . Allergy   . Arthritis   . Arthropathy    and scroilitis  . Asthma   . Blood transfusion without reported diagnosis   . Chronic drug-induced interstitial lung disorders ([place])   . Crohn's ileocolitis ([facility]) 08/29/2008  . Dysplastic nevi   . GERD (gastroesophageal reflux disease)   . Herniated disc   . History of proctitis 2006  . IBD (inflammatory bowel disease)   . Incisional hernia   . Migraine headache   . Morton's neuroma of right foot   . Osteopenia   . Pouchitis ([place]) 2006  . Small bowel obstruction due to adhesions ([facility]) 02/09/2015  . Thyroid disease   . Vitamin D deficiency    Past Surgical History:  Procedure Laterality Date  . COLECTOMY    . COLONOSCOPY W/ BIOPSIES  04/16/2006   crohn's colitis  . FLEXIBLE SIGMOIDOSCOPY  2006 - 02/2014   ileocolitis, pouchitis.  with pouch ulcer.   . FUNCTIONAL ENDOSCOPIC SINUS SURGERY  12/2003   partial ethmoidectomy.  Dr Wilburn Cornelia  . ileoanal pull-through    . LUMBAR LAMINECTOMY/DECOMPRESSION MICRODISCECTOMY Right 10/13/2015   Procedure: Right Lumbar four- five Microdiskectomy;  Surgeon: Erline Levine, MD;  Location: [facility];  Service: Neurosurgery;  Laterality: Right;  Right L4-5 Microdiskectomy  . sacroilitis    . SIGMOIDOSCOPY  01/2005  . VENTRAL HERNIA REPAIR  06/2014   Social History    Socioeconomic History  . Marital status: Divorced    Spouse name: Not on file  . Number of children: 2  . Years of education: 16 at least  . Highest education level: Not on file  Occupational History  . Occupation: Surveyor, quantity: [employer]  . Financial resource strain: Not on file  . Food insecurity    Worry: Not on file    Inability: Not on file  . Transportation needs    Medical: Not on file    Non-medical: Not on file  Tobacco Use  . Smoking status: Never Smoker  . Smokeless tobacco: Never Used  Substance and Sexual Activity  . Alcohol use: Yes    Alcohol/week: 14.0 standard drinks    Types: 14 Glasses of wine per week    Comment: couple of drinks daily  . Drug use: [name]  . Sexual activity: Yes  Lifestyle  . Physical activity    Days per week: Not on file    Minutes per session: Not on file  . Stress: Not on file  Relationships  . Social Herbalist on phone: Not on file    Gets together: Not on file    Attends religious service: Not on file    Active member of club or organization: Not on file  Attends meetings of clubs or organizations: Not on file    Relationship status: Not on file  Other Topics Concern  . Not on file  Social History Narrative   Father a pulmonologist in [place].   Married to Dr. Honor Junes daughter Gerald Stabs - separated 2017   Wife is CRNA   2 sons   + EtOH   [name] tobacco   [name] drugs   Allergies  Allergen Reactions  . Mercaptopurine Nausea And Vomiting    Felt ill within 2 days of initiating therapy.   Family History  Problem Relation Age of Onset  . Ulcerative colitis Mother   . Colon cancer Paternal Grandmother   . Brain cancer Paternal Grandmother        mets from colon      Current Outpatient Medications (Respiratory):  .  albuterol (PROVENTIL HFA;VENTOLIN HFA) 108 (90 Base) MCG/ACT inhaler, Inhale 2 puffs into the lungs every 6 (six) hours as needed. Marland Kitchen  azelastine (ASTELIN) 0.1 %  nasal spray, Place 2 sprays into both nostrils 2 (two) times daily. Use in each nostril as directed .  budesonide-formoterol (SYMBICORT) 160-4.5 MCG/ACT inhaler, Inhale 1 puff into the lungs 2 (two) times daily.  Current Outpatient Medications (Analgesics):  .  celecoxib (CELEBREX) 100 MG capsule, TAKE 1 CAPSULE BY MOUTH 2 TIMES DAILY AS NEEDED .  HUMIRA PEN 40 MG/0.8ML PNKT, INJECT ONE PEN SUBCUTANEOUSLY EVERY OTHER WEEK. REFRIGERATE. Marland Kitchen  zolmitriptan (ZOMIG-ZMT) 5 MG disintegrating tablet, DISSOLVE 1 TABLET BY MOUTH AS NEEDED AS DIRECTED   Current Outpatient Medications (Other):  Marland Kitchen  AMBULATORY NON FORMULARY MEDICATION, Inject into the vein. Iron Infusion  monthly .  cholecalciferol (VITAMIN D) 1000 units tablet, Take 1,000 Units by mouth daily. .  lansoprazole (PREVACID) 30 MG capsule, Take 1 capsule (30 mg total) by mouth 2 (two) times daily. .  Probiotic Product (VSL#3 DS) PACK, DISSOLVE 1 PACKET IN FLUID AND TAKE EVERY DAY    Past medical history, social, surgical and family history all reviewed in electronic medical record.  [name] pertanent information unless stated regarding to the chief complaint.   Review of Systems:  [name] headache, visual changes, nausea, vomiting, diarrhea, constipation, dizziness, abdominal pain, skin rash, fevers, chills, night sweats, weight loss, swollen lymph nodes, body aches, joint swelling, muscle aches, chest pain, shortness of breath, mood changes.   Objective  Blood pressure 116/84, pulse (!) 105, height 5' 9"  (1.753 m), weight 146 lb (66.2 kg), SpO2 98 %. Systems examined below as of    General: [name] apparent distress alert and oriented x3 mood and affect normal, dressed appropriately.  HEENT: Pupils equal, extraocular movements intact  Respiratory: Patient's speak in full sentences and does not appear short of breath  Cardiovascular: [name] lower extremity edema, non tender, [name] erythema  Skin: Warm dry intact with [name] signs of infection or rash on extremities  or on axial skeleton.  Abdomen: Soft nontender  Neuro: Cranial nerves II through XII are intact, neurovascularly intact in all extremities with 2+ DTRs and 2+ pulses.  Lymph: [name] lymphadenopathy of posterior or anterior cervical chain or axillae bilaterally.  Gait normal with good balance and coordination.  MSK:  Non tender with full range of motion and good stability and symmetric strength and tone of shoulders, elbows, wrist, hip, knee and ankles bilaterally.  Back exam shows some mild tightness in the paraspinal musculature of the lumbar spine right greater than left.  Mild positive pain with Corky Sox test.  Negative straight leg test.  Osteopathic  findings  C4 flexed rotated and side bent left C7 flexed rotated and side bent left T3 extended rotated and side bent right inhaled third rib T9 extended rotated and side bent left L4 flexed rotated and side bent right Sacrum right on right    Impression and Recommendations:     This case required medical decision making of moderate complexity. The above documentation has been reviewed and is accurate and complete Lyndal Pulley, DO       Note: This dictation was prepared with Dragon dictation along with smaller phrase technology. Any transcriptional errors that result from this process are unintentional.

## 2019-03-18 NOTE — Patient Instructions (Signed)
Exercises on wall.  Heel and butt touching.  Raise leg 6 inches and hold 2 seconds.  Down slow for count of 4 seconds.  1 set of 30 reps daily on both sides.   See me again in 7-8 weeks

## 2019-03-22 ENCOUNTER — Other Ambulatory Visit: Payer: Self-pay | Admitting: Internal Medicine

## 2019-03-22 NOTE — Telephone Encounter (Signed)
Please advise, he was seen in February.

## 2019-03-23 ENCOUNTER — Encounter: Payer: Self-pay | Admitting: Family Medicine

## 2019-03-30 ENCOUNTER — Encounter: Payer: Self-pay | Admitting: Family Medicine

## 2019-04-01 ENCOUNTER — Encounter: Payer: Self-pay | Admitting: Family Medicine

## 2019-04-01 ENCOUNTER — Ambulatory Visit (INDEPENDENT_AMBULATORY_CARE_PROVIDER_SITE_OTHER): Payer: 59 | Admitting: Family Medicine

## 2019-04-01 DIAGNOSIS — J0100 Acute maxillary sinusitis, unspecified: Secondary | ICD-10-CM | POA: Diagnosis not present

## 2019-04-01 DIAGNOSIS — K5010 Crohn's disease of large intestine without complications: Secondary | ICD-10-CM

## 2019-04-01 DIAGNOSIS — J309 Allergic rhinitis, unspecified: Secondary | ICD-10-CM | POA: Diagnosis not present

## 2019-04-01 MED ORDER — AMOXICILLIN-POT CLAVULANATE 875-125 MG PO TABS: 1.0000 | ORAL_TABLET | Freq: Two times a day (BID) | ORAL | 0 refills | Status: DC

## 2019-04-01 MED ORDER — PREDNISONE 20 MG PO TABS: ORAL_TABLET | ORAL | 0 refills | Status: DC

## 2019-04-01 NOTE — Progress Notes (Signed)
Virtual Visit via Video Note  Subjective  CC:  Chief Complaint  Patient presents with  . Sinusitis    Started 1 month ago on/off and slowly getting worse.Marland Kitchen HA, neck ache, teeth pain, and congestion.. He has tried Afrin, nasal rinses, and Tylenol   Same day acute visit; PCP not available. New pt to me. Chart reviewed.   I connected with [name] on 04/01/19 at  9:00 AM EDT by a video enabled telemedicine application and verified that I am speaking with the correct person using two identifiers. Location patient: Home Location provider: [facility], Office Persons participating in the virtual visit: Charles [name], Leamon Arnt, MD Lilli Light, [place] discussed the limitations of evaluation and management by telemedicine and the availability of in person appointments. The patient expressed understanding and agreed to proceed. HPI: Charles [name] is a 48 y.o. male who was contacted today to address the problems listed above in the chief complaint. . 48 yo male with crohn's disease on humira with chronic allergies and h/o recurrent sinusitis but no recent infections presents with classic sinus infection sxs ongoing x 3-4 weeks in spite of aggressive allergy care measures. No fevers but + dental pain and sinus pain. Feels under the weather. No cough, pnd or sob.   Assessment  1. Acute non-recurrent maxillary sinusitis   2. Chronic allergic rhinitis   3. Crohn's disease of large intestine without complication ([place])      Plan   sinusitis:  Abx, pred burst and supportive care. I discussed the assessment and treatment plan with the patient. The patient was provided an opportunity to ask questions and all were answered. The patient agreed with the plan and demonstrated an understanding of the instructions.   The patient was advised to call back or seek an in-person evaluation if the symptoms worsen or if the condition fails to improve as anticipated.  Follow up: No follow-ups on file.  Visit date not found  Meds ordered this encounter  Medications  . amoxicillin-clavulanate (AUGMENTIN) 875-125 MG tablet    Sig: Take 1 tablet by mouth 2 (two) times daily.    Dispense:  14 tablet    Refill:  0  . predniSONE (DELTASONE) 20 MG tablet    Sig: Take 2 tabs daily for 5 days    Dispense:  10 tablet    Refill:  0      I reviewed the patients updated PMH, FH, and SocHx.    Patient Active Problem List   Diagnosis Date Noted  . Nonallopathic lesion of cervical region 03/18/2019  . Nonallopathic lesion of sacral region 03/18/2019  . Allergic rhinitis 11/10/2018  . GERD (gastroesophageal reflux disease) 09/25/2018  . Absolute anemia 09/25/2018  . Other iron deficiency anemias 09/25/2018  . Pain and swelling of left knee 04/14/2018  . Forearm joint pain 07/30/2017  . TFCC (triangular fibrocartilage complex) injury, right, initial encounter 04/17/2017  . Closed fracture of pisiform 04/17/2017  . Anxiety state 07/05/2016  . Carpal tunnel syndrome on right 05/30/2016  . Synovitis of finger 01/11/2016  . Herniated lumbar disc without myelopathy 10/13/2015  . Lumbar back pain with radiculopathy affecting right lower extremity 10/06/2015  . Tibialis posterior tendinitis 08/10/2015  . 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 Chronic pain of right ankle 08/01/2015  . 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 Neuroma of third interspace of left foot 04/10/2015  . Thoracic back pain 12/06/2014  . Nonallopathic lesion  of thoracic region 12/06/2014  . Nonallopathic lesion-rib cage 12/06/2014  . Nonallopathic lesion of lumbosacral region 12/06/2014  . Vitamin D deficiency 08/31/2014  . Loss of transverse plantar arch 07/26/2014  . Hip abductor tendinitis 07/26/2014  . Achilles tendinosis 07/05/2014  . De Quervain's disease (radial styloid tenosynovitis) 05/05/2014  . Morton's neuroma of right foot 01/18/2014  . Crohn's disease ([place]) 12/27/2013  .  Airway hyperreactivity 06/11/2013  . Dysplastic nevus of trunk 05/26/2012  . 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 EPIDERMOID CYST 02/15/2008  . Osteopenia 10/09/2007   Current Meds  Medication Sig  . albuterol (PROVENTIL HFA;VENTOLIN HFA) 108 (90 Base) MCG/ACT inhaler Inhale 2 puffs into the lungs every 6 (six) hours as needed.  . AMBULATORY NON FORMULARY MEDICATION Inject into the vein. Iron Infusion  monthly  . azelastine (ASTELIN) 0.1 % nasal spray Place 2 sprays into both nostrils 2 (two) times daily. Use in each nostril as directed  . budesonide-formoterol (SYMBICORT) 160-4.5 MCG/ACT inhaler Inhale 1 puff into the lungs 2 (two) times daily.  . celecoxib (CELEBREX) 100 MG capsule TAKE 1 CAPSULE BY MOUTH 2 TIMES DAILY AS NEEDED  . cholecalciferol (VITAMIN D) 1000 units tablet Take 1,000 Units by mouth daily.  Marland Kitchen HUMIRA PEN 40 MG/0.8ML PNKT INJECT ONE PEN SUBCUTANEOUSLY EVERY OTHER WEEK. REFRIGERATE.  Marland Kitchen lansoprazole (PREVACID) 30 MG capsule Take 1 capsule (30 mg total) by mouth 2 (two) times daily.  . Probiotic Product (VSL#3 DS) PACK DISSOLVE 1 PACKET IN FLUID AND TAKE EVERY DAY  . zolmitriptan (ZOMIG-ZMT) 5 MG disintegrating tablet DISSOLVE 1 TABLET BY MOUTH AS NEEDED AS DIRECTED    Allergies: Patient is allergic to mercaptopurine. Family History: Patient family history includes Brain cancer in his paternal grandmother; Colon cancer in his paternal grandmother; Ulcerative colitis in his mother. Social History:  Patient  reports that he has never smoked. He has never used smokeless tobacco. He reports current alcohol use of about 14.0 standard drinks of alcohol per week. He reports that he does not use drugs.  Review of Systems: Constitutional: Negative for fever malaise or anorexia Cardiovascular: negative for chest pain Respiratory: negative for SOB or persistent cough Gastrointestinal: negative for abdominal pain  OBJECTIVE Vitals: There were  no vitals taken for this visit. General: no acute distress , A&Ox3  Leamon Arnt, MD

## 2019-04-07 ENCOUNTER — Encounter: Payer: Self-pay | Admitting: Family Medicine

## 2019-04-08 ENCOUNTER — Other Ambulatory Visit: Payer: Self-pay

## 2019-04-08 MED ORDER — DOXYCYCLINE HYCLATE 100 MG PO TABS: 100.0000 mg | ORAL_TABLET | Freq: Two times a day (BID) | ORAL | 0 refills | Status: AC

## 2019-04-08 MED ORDER — DOXYCYCLINE HYCLATE 100 MG PO TABS: 100.0000 mg | ORAL_TABLET | Freq: Two times a day (BID) | ORAL | 0 refills | Status: DC

## 2019-04-13 ENCOUNTER — Other Ambulatory Visit: Payer: Self-pay | Admitting: Gastroenterology

## 2019-05-24 NOTE — Progress Notes (Signed)
[name] [address], [place] [address] Phone: [phone] Subjective:   Charles [name], am serving as a scribe for Dr. Hulan Saas.     CC: Back pain follow-up  P[id number]    03/18/19: G responding fairly well to osteopathic manipulation.  Patient is finally made some progress and can actually decrease the amount of frequency to more 6 to 8 weeks.  I believe patient will do well with conservative therapy at this point.  Update- 05/25/19: Charles [name] is a 48 y.o. male coming in with complaint of back pain. Has not had any issues since last visit.  Patient has responded well to manipulation.  Continues to stay active.  Mild cramping in his calves.  Has had low iron previously secondary to his ulcerative colitis.  As needed infusions and has been sometime since he has had it.  Working on seeing a new primary care physician in the near future.    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[place])    Crohn's ileocolitis ([place]) 08/29/2008   Dysplastic nevi    GERD (gastroesophageal reflux disease)    Herniated disc    History of proctitis 2006   IBD (inflammatory bowel disease)    Incisional hernia    Migraine headache    Morton's neuroma of right foot    Osteopenia    Pouchitis ([place]) 2006   Small bowel obstruction due to adhesions ([place]) 02/09/2015   Thyroid disease    Vitamin D deficiency    Past Surgical History:  Procedure Laterality Date   COLECTOMY     COLONOSCOPY W/ BIOPSIES  04/16/2006   crohn's colitis   FLEXIBLE SIGMOIDOSCOPY  2006 - 02/2014   ileocolitis, pouchitis.  with pouch ulcer.    FUNCTIONAL ENDOSCOPIC SINUS SURGERY  12/2003   partial ethmoidectomy.  Dr Wilburn Cornelia   ileoanal pull-through     LUMBAR LAMINECTOMY/DECOMPRESSION  MICRODISCECTOMY Right 10/13/2015   Procedure: Right Lumbar four- five Microdiskectomy;  Surgeon: Erline Levine, MD;  Location: [facility];  Service: Neurosurgery;  Laterality: Right;  Right L4-5 Microdiskectomy   sacroilitis     SIGMOIDOSCOPY  01/2005   VENTRAL HERNIA REPAIR  06/2014   Social History   Socioeconomic History   Marital status: Divorced    Spouse name: Not on file   Number of children: 2   Years of education: 16 at least   Highest education level: Not on file  Occupational History   Occupation: Surveyor, quantity: [employer]   Financial resource strain: Not on file   Food insecurity    Worry: Not on file    Inability: Not on file   Transportation needs    Medical: Not on file    Non-medical: Not on file  Tobacco Use   Smoking status: Never Smoker   Smokeless tobacco: Never Used  Substance and Sexual Activity   Alcohol use: Yes    Alcohol/week: 14.0 standard drinks    Types: 14 Glasses of wine per week    Comment: couple of drinks daily   Drug use: [name]   Sexual activity: Yes  Lifestyle   Physical activity    Days per week: Not on file    Minutes per session: Not on  file   Stress: Not on file  Relationships   Social connections    Talks on phone: Not on file    Gets together: Not on file    Attends religious service: Not on file    Active member of club or organization: Not on file    Attends meetings of clubs or organizations: Not on file    Relationship status: Not on file  Other Topics Concern   Not on file  Social History Narrative   Father a pulmonologist in [employer].   Married to Dr. Honor Junes daughter Gerald Stabs - separated 2017   Wife is CRNA   2 sons   + EtOH   [name] tobacco   [name] drugs   Allergies  Allergen Reactions   Mercaptopurine Nausea And Vomiting    Felt ill within 2 days of initiating therapy.   Family History  Problem Relation Age of Onset   Ulcerative colitis Mother    Colon  cancer Paternal Grandmother    Brain cancer Paternal Grandmother        mets from colon    Current Outpatient Medications (Endocrine & Metabolic):    predniSONE (DELTASONE) 20 MG tablet, Take 2 tabs daily for 5 days   Current Outpatient Medications (Respiratory):    albuterol (PROVENTIL HFA;VENTOLIN HFA) 108 (90 Base) MCG/ACT inhaler, Inhale 2 puffs into the lungs every 6 (six) hours as needed.   azelastine (ASTELIN) 0.1 % nasal spray, Place 2 sprays into both nostrils 2 (two) times daily. Use in each nostril as directed   budesonide-formoterol (SYMBICORT) 160-4.5 MCG/ACT inhaler, Inhale 1 puff into the lungs 2 (two) times daily.  Current Outpatient Medications (Analgesics):    celecoxib (CELEBREX) 100 MG capsule, TAKE 1 CAPSULE BY MOUTH 2 TIMES DAILY AS NEEDED   HUMIRA PEN 40 MG/0.8ML PNKT, INJECT ONE PEN SUBCUTANEOUSLY EVERY OTHER WEEK. REFRIGERATE.   zolmitriptan (ZOMIG-ZMT) 5 MG disintegrating tablet, DISSOLVE 1 TABLET BY MOUTH AS NEEDED AS DIRECTED   Current Outpatient Medications (Other):    AMBULATORY NON FORMULARY MEDICATION, Inject into the vein. Iron Infusion  monthly   amoxicillin-clavulanate (AUGMENTIN) 875-125 MG tablet, Take 1 tablet by mouth 2 (two) times daily.   cholecalciferol (VITAMIN D) 1000 units tablet, Take 1,000 Units by mouth daily.   lansoprazole (PREVACID) 30 MG capsule, Take 1 capsule (30 mg total) by mouth 2 (two) times daily.   Probiotic Product (VSL#3 DS) PACK, DISSOLVE 1 PACKET IN FLUID AND TAKE EVERY DAY   Vitamin D, Ergocalciferol, (DRISDOL) 1.25 MG (50000 UT) CAPS capsule, Take 1 capsule (50,000 Units total) by mouth every 7 (seven) days.    Past medical history, social, surgical and family history all reviewed in electronic medical record.  [name] pertanent information unless stated regarding to the chief complaint.   Review of Systems:  [name] headache, visual changes, nausea, vomiting, diarrhea, constipation, dizziness, abdominal pain, skin  rash, fevers, chills, night sweats, weight loss, swollen lymph nodes, body aches, joint swelling, chest pain, shortness of breath, mood changes.  Positive muscle aches  Objective  Blood pressure 122/80, pulse (!) 113, height 5' 9"  (1.753 m), weight 157 lb (71.2 kg), SpO2 96 %.     General: [name] apparent distress alert and oriented x3 mood and affect normal, dressed appropriately.  HEENT: Pupils equal, extraocular movements intact  Respiratory: Patient's speak in full sentences and does not appear short of breath  Cardiovascular: [name] lower extremity edema, non tender, [name] erythema  Skin: Warm dry intact with [name] signs of infection or  rash on extremities or on axial skeleton.  Abdomen: Soft nontender  Neuro: Cranial nerves II through XII are intact, neurovascularly intact in all extremities with 2+ DTRs and 2+ pulses.  Lymph: [name] lymphadenopathy of posterior or anterior cervical chain or axillae bilaterally.  Gait mild antalgic MSK:  Non tender with full range of motion and good stability and symmetric strength and tone of shoulders, elbows, wrist, hip, knee and ankles bilaterally.  Back exam does have some tightness noted.  Mild positive Faber test on the right side.  Negative straight leg test.  5 out of 5 strength in lower extremities.  Deep tendon reflexes intact.  Osteopathic findings  C6 flexed rotated and side bent left T3 extended rotated and side bent right inhaled third rib T9 extended rotated and side bent left L2 flexed rotated and side bent right Sacrum right on right    Impression and Recommendations:     This case required medical decision making of moderate complexity. The above documentation has been reviewed and is accurate and complete Charles Pulley, DO       Note: This dictation was prepared with Dragon dictation along with smaller phrase technology. Any transcriptional errors that result from this process are unintentional.

## 2019-05-25 ENCOUNTER — Ambulatory Visit (INDEPENDENT_AMBULATORY_CARE_PROVIDER_SITE_OTHER): Payer: 59 | Admitting: Family Medicine

## 2019-05-25 ENCOUNTER — Encounter: Payer: Self-pay | Admitting: Family Medicine

## 2019-05-25 ENCOUNTER — Other Ambulatory Visit: Payer: Self-pay

## 2019-05-25 VITALS — BP 122/80 | HR 113 | Ht 69.0 in | Wt 157.0 lb

## 2019-05-25 DIAGNOSIS — M2550 Pain in unspecified joint: Secondary | ICD-10-CM

## 2019-05-25 DIAGNOSIS — M999 Biomechanical lesion, unspecified: Secondary | ICD-10-CM

## 2019-05-25 DIAGNOSIS — M5416 Radiculopathy, lumbar region: Secondary | ICD-10-CM

## 2019-05-25 MED ORDER — VITAMIN D (ERGOCALCIFEROL) 1.25 MG (50000 UNIT) PO CAPS: 50000.0000 [IU] | ORAL_CAPSULE | ORAL | 0 refills | Status: DC

## 2019-05-25 NOTE — Assessment & Plan Note (Signed)
No radicular symptoms at this time.  We discussed core strengthening and stability.  Patient has responded well to osteopathic manipulation.  History of vitamin D and iron deficiencies and encouraged him to consider the possibility of repeating these.  Patient is seeing a different primary care provider and will get labs at that time.  Follow-up in 4 to 8 weeks

## 2019-05-25 NOTE — Assessment & Plan Note (Signed)
Decision today to treat with OMT was based on Physical Exam  After verbal consent patient was treated with HVLA, ME, FPR techniques in cervical, thoracic, rib lumbar and sacral areas  Patient tolerated the procedure well with improvement in symptoms  Patient given exercises, stretches and lifestyle modifications  See medications in patient instructions if given  Patient will follow up in 4-8 weeks 

## 2019-05-25 NOTE — Patient Instructions (Signed)
See me again in 2-3 months  Try pinkie-size amt of Pen[place]aid 2x/day  Vit D once weekly x 12 weeks  Placed future order for labs

## 2019-06-15 ENCOUNTER — Other Ambulatory Visit: Payer: Self-pay

## 2019-06-15 ENCOUNTER — Ambulatory Visit (INDEPENDENT_AMBULATORY_CARE_PROVIDER_SITE_OTHER): Payer: 59 | Admitting: Family Medicine

## 2019-06-15 ENCOUNTER — Encounter: Payer: Self-pay | Admitting: Family Medicine

## 2019-06-15 VITALS — BP 110/78 | HR 67 | Temp 97.7°F | Ht 69.0 in | Wt 158.2 lb

## 2019-06-15 DIAGNOSIS — Z23 Encounter for immunization: Secondary | ICD-10-CM | POA: Diagnosis not present

## 2019-06-15 DIAGNOSIS — Z111 Encounter for screening for respiratory tuberculosis: Secondary | ICD-10-CM

## 2019-06-15 DIAGNOSIS — E559 Vitamin D deficiency, unspecified: Secondary | ICD-10-CM

## 2019-06-15 DIAGNOSIS — J4520 Mild intermittent asthma, uncomplicated: Secondary | ICD-10-CM | POA: Diagnosis not present

## 2019-06-15 DIAGNOSIS — D508 Other iron deficiency anemias: Secondary | ICD-10-CM | POA: Diagnosis not present

## 2019-06-15 DIAGNOSIS — Z0001 Encounter for general adult medical examination with abnormal findings: Secondary | ICD-10-CM

## 2019-06-15 DIAGNOSIS — R5383 Other fatigue: Secondary | ICD-10-CM | POA: Diagnosis not present

## 2019-06-15 DIAGNOSIS — Z0000 Encounter for general adult medical examination without abnormal findings: Secondary | ICD-10-CM

## 2019-06-15 DIAGNOSIS — Z13220 Encounter for screening for lipoid disorders: Secondary | ICD-10-CM | POA: Diagnosis not present

## 2019-06-15 DIAGNOSIS — K5010 Crohn's disease of large intestine without complications: Secondary | ICD-10-CM

## 2019-06-15 DIAGNOSIS — J309 Allergic rhinitis, unspecified: Secondary | ICD-10-CM

## 2019-06-15 DIAGNOSIS — K91850 Pouchitis: Secondary | ICD-10-CM

## 2019-06-15 LAB — COMPREHENSIVE METABOLIC PANEL
ALT: 17 U/L (ref 0–53)
AST: 22 U/L (ref 0–37)
Albumin: 4.3 g/dL (ref 3.5–5.2)
Alkaline Phosphatase: 58 U/L (ref 39–117)
BUN: 10 mg/dL (ref 6–23)
CO2: 32 mEq/L (ref 19–32)
Calcium: 9.6 mg/dL (ref 8.4–10.5)
Chloride: 100 mEq/L (ref 96–112)
Creatinine, Ser: 0.9 mg/dL (ref 0.40–1.50)
GFR: 90.58 mL/min (ref >60.00)
Glucose, Bld: 92 mg/dL (ref 70–99)
Potassium: 4.1 mEq/L (ref 3.5–5.1)
Sodium: 138 mEq/L (ref 135–145)
Total Bilirubin: 2.1 mg/dL — ABNORMAL HIGH (ref 0.2–1.2)
Total Protein: 7.5 g/dL (ref 6.0–8.3)

## 2019-06-15 LAB — CBC
HCT: 43.8 % (ref 39.0–52.0)
Hemoglobin: 14.6 g/dL (ref 13.0–17.0)
MCHC: 33.3 g/dL (ref 30.0–36.0)
MCV: 90 fl (ref 78.0–100.0)
Platelets: 254 10*3/uL (ref 150.0–400.0)
RBC: 4.86 Mil/uL (ref 4.22–5.81)
RDW: 13.8 % (ref 11.5–15.5)
WBC: 6.4 10*3/uL (ref 4.0–10.5)

## 2019-06-15 LAB — LIPID PANEL
Cholesterol: 176 mg/dL (ref 0–200)
HDL: 43.5 mg/dL (ref >39.00)
NonHDL: 132.47
Total CHOL/HDL Ratio: 4
Triglycerides: 208 mg/dL — ABNORMAL HIGH (ref 0.0–149.0)
VLDL: 41.6 mg/dL — ABNORMAL HIGH (ref 0.0–40.0)

## 2019-06-15 LAB — LDL CHOLESTEROL, DIRECT: Direct LDL: 85 mg/dL

## 2019-06-15 NOTE — Assessment & Plan Note (Signed)
Stable.  Continue Symbicort.  Continue albuterol as needed.

## 2019-06-15 NOTE — Patient Instructions (Addendum)
It was very nice to see you today!  We will check blood work today and give you your flu vaccine.  Please try taking 16m of benadryl at night to help you with sleeping.  We may need to get a sleep study if your blood work is normal.  Come back in 1 year for your next physical, or sooner if needed  Take care, Dr PJerline Pain Please try these tips to maintain a healthy lifestyle:   Eat at least 3 REAL meals and 1-2 snacks per day.  Aim for no more than 5 hours between eating.  If you eat breakfast, please do so within one hour of getting up.    Obtain twice as many fruits/vegetables as protein or carbohydrate foods for both lunch and dinner. (Half of each meal should be fruits/vegetables, one quarter protein, and one quarter starchy carbs)   Cut down on sweet beverages. This includes juice, soda, and sweet tea.    Exercise at least 150 minutes every week.    Preventive Care 48Years Old, Male Preventive care refers to lifestyle choices and visits with your health care provider that can promote health and wellness. This includes:  A yearly physical exam. This is also called an annual well check.  Regular dental and eye exams.  Immunizations.  Screening for certain conditions.  Healthy lifestyle choices, such as eating a healthy diet, getting regular exercise, not using drugs or products that contain nicotine and tobacco, and limiting alcohol use. What can I expect for my preventive care visit? Physical exam Your health care provider will check:  Height and weight. These may be used to calculate body mass index (BMI), which is a measurement that tells if you are at a healthy weight.  Heart rate and blood pressure.  Your skin for abnormal spots. Counseling Your health care provider may ask you questions about:  Alcohol, tobacco, and drug use.  Emotional well-being.  Home and relationship well-being.  Sexual activity.  Eating habits.  Work and work  eStatistician What immunizations do I need?  Influenza (flu) vaccine  This is recommended every year. Tetanus, diphtheria, and pertussis (Tdap) vaccine  You may need a Td booster every 10 years. Varicella (chickenpox) vaccine  You may need this vaccine if you have not already been vaccinated. Zoster (shingles) vaccine  You may need this after age 46 Measles, mumps, and rubella (MMR) vaccine  You may need at least one dose of MMR if you were born in 1957 or later. You may also need a second dose. Pneumococcal conjugate (PCV13) vaccine  You may need this if you have certain conditions and were not previously vaccinated. Pneumococcal polysaccharide (PPSV23) vaccine  You may need one or two doses if you smoke cigarettes or if you have certain conditions. Meningococcal conjugate (MenACWY) vaccine  You may need this if you have certain conditions. Hepatitis A vaccine  You may need this if you have certain conditions or if you travel or work in places where you may be exposed to hepatitis A. Hepatitis B vaccine  You may need this if you have certain conditions or if you travel or work in places where you may be exposed to hepatitis B. Haemophilus influenzae type b (Hib) vaccine  You may need this if you have certain risk factors. Human papillomavirus (HPV) vaccine  If recommended by your health care provider, you may need three doses over 6 months. You may receive vaccines as individual doses or as more than one vaccine  together in one shot (combination vaccines). Talk with your health care provider about the risks and benefits of combination vaccines. What tests do I need? Blood tests  Lipid and cholesterol levels. These may be checked every 5 years, or more frequently if you are over 46 years old.  Hepatitis C test.  Hepatitis B test. Screening  Lung cancer screening. You may have this screening every year starting at age 46 if you have a 30-pack-year history of smoking  and currently smoke or have quit within the past 15 years.  Prostate cancer screening. Recommendations will vary depending on your family history and other risks.  Colorectal cancer screening. All adults should have this screening starting at age 46 and continuing until age 46. Your health care provider may recommend screening at age 47 if you are at increased risk. You will have tests every 1-10 years, depending on your results and the type of screening test.  Diabetes screening. This is done by checking your blood sugar (glucose) after you have not eaten for a while (fasting). You may have this done every 1-3 years.  Sexually transmitted disease (STD) testing. Follow these instructions at home: Eating and drinking  Eat a diet that includes fresh fruits and vegetables, whole grains, lean protein, and low-fat dairy products.  Take vitamin and mineral supplements as recommended by your health care provider.  Do not drink alcohol if your health care provider tells you not to drink.  If you drink alcohol: ? Limit how much you have to 0-2 drinks a day. ? Be aware of how much alcohol is in your drink. In the [place] one drink equals one 12 oz bottle of beer (355 mL), one 5 oz glass of wine (148 mL), or one 1 oz glass of hard liquor (44 mL). Lifestyle  Take daily care of your teeth and gums.  Stay active. Exercise for at least 30 minutes on 5 or more days each week.  Do not use any products that contain nicotine or tobacco, such as cigarettes, e-cigarettes, and chewing tobacco. If you need help quitting, ask your health care provider.  If you are sexually active, practice safe sex. Use a condom or other form of protection to prevent STIs (sexually transmitted infections).  Talk with your health care provider about taking a low-dose aspirin every day starting at age 46. What's next?  Go to your health care provider once a year for a well check visit.  Ask your health care provider how  often you should have your eyes and teeth checked.  Stay up to date on all vaccines. This information is not intended to replace advice given to you by your health care provider. Make sure you discuss any questions you have with your health care provider. Document Released: 09/01/2015 Document Revised: 07/30/2018 Document Reviewed: 07/30/2018 Elsevier Patient Education  2020 [employer].

## 2019-06-15 NOTE — Assessment & Plan Note (Signed)
Stable.  Continue Humira per GI.  QuantiFERON today.

## 2019-06-15 NOTE — Assessment & Plan Note (Signed)
Continue over-the-counter medications.  Will consider referral to ENT if symptoms continue to be bothersome.

## 2019-06-15 NOTE — Assessment & Plan Note (Signed)
Stable.  Continue management per GI.

## 2019-06-15 NOTE — Assessment & Plan Note (Signed)
Check vitamin D level 

## 2019-06-15 NOTE — Progress Notes (Signed)
Chief Complaint:  Charles [name] is a 48 y.o. male who presents today for his annual comprehensive physical exam.    Assessment/Plan:  Airway hyperreactivity Stable.  Continue Symbicort.  Continue albuterol as needed.  Crohn's disease ([place]) Stable.  Continue Humira per GI.  QuantiFERON today.  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  Continue management per GI.  Vitamin D deficiency Check vitamin D level.  Allergic rhinitis Continue over-the-counter medications.  Will consider referral to ENT if symptoms continue to be bothersome.  Fatigue Unclear etiology.  Likely multifactorial.  Will check CBC, C met, TSH, B12, and vitamin D.  Would consider sleep study if work-up is unremarkable.  Is having some difficulty staying asleep.  Recommended 50 mg of Benadryl nightly.  Preventative Healthcare: Flu vaccine today. Check quantiferon.  Check CBC, C met, TSH, vitamin D, vitamin B12, and lipid panel.  Patient Counseling(The following topics were reviewed and/or handout was given):  -Nutrition: Stressed importance of moderation in sodium/caffeine intake, saturated fat and cholesterol, caloric balance, sufficient intake of fresh fruits, vegetables, and fiber.  -Stressed the importance of regular exercise.   -Substance Abuse: Discussed cessation/primary prevention of tobacco, alcohol, or other drug use; driving or other dangerous activities under the influence; availability of treatment for abuse.   -Injury prevention: Discussed safety belts, safety helmets, smoke detector, smoking near bedding or upholstery.   -Sexuality: Discussed sexually transmitted diseases, partner selection, use of condoms, avoidance of unintended pregnancy and contraceptive alternatives.   -Dental health: Discussed importance of regular tooth brushing, flossing, and dental visits.  -Health maintenance and immunizations reviewed. Please refer to Health maintenance section.  Return to care in 1 year for next preventative visit.      Subjective:  HPI:  He has no acute complaints today.   He has noticed increasing fatigue and daytime somnolence over the last few months.  Will frequently take an afternoon elevated.  He has some difficulty with sleeping.  States that he will usually wake up earlier than intended and have difficulty time going back to sleep.  Usually falls asleep between 1030 and 12 and typically wakes up around 7.  He does note some snoring when sleeping.  Also notes some raspy breathing.  No observed apneic episodes.  No specific treatments tried.  His stable, chronic medical conditions are outlined below:   # Hyperactive Airways / Allergic rhinitis  -On Symbicort 1 puff twice daily - Uses albuterol as needed -  No recent flares  % Chronic disease - Follows with GI - On Humira and tolerating well - On prevacid 39m twice daily   % Polyarthralgia - Follows with sports medicine  Lifestyle Diet: Limited due to Crohn's disease.  Exercise: Likes to go running.   Depression screen PHQ 2/9 05/29/2018  Decreased Interest 0  Down, Depressed, Hopeless 0  PHQ - 2 Score 0   Health Maintenance Due  Topic Date Due  . HIV Screening  10/18/1987  . INFLUENZA VACCINE  03/20/2019    ROS: Per HPI, otherwise a complete review of systems was negative.   PMH:  The following were reviewed and entered/updated in epic: Past Medical History:  Diagnosis Date  . Allergy   . Arthritis   . Arthropathy    and scroilitis  . Asthma   . Blood transfusion without reported diagnosis   . 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 [place]itis [place] 08/29/2008  . Dysplastic nevi   . GERD (gastroesophageal reflux disease)   . Herniated disc   .  History of proctitis 2006  . IBD (inflammatory bowel disease)   . Incisional hernia   . Migraine headache   . Morton's neuroma of right foot   . Osteopenia   . Pouchitis ([place]) 2006  . Small bowel obstruction due to adhesions ([place]) 02/09/2015   . Thyroid disease   . Vitamin D deficiency    Patient Active Problem List   Diagnosis Date Noted  . Allergic rhinitis 11/10/2018  . GERD (gastroesophageal reflux disease) 09/25/2018  . Anxiety state 07/05/2016  . Ileal pouchitis ([place]) 06/27/2015  . Vitamin D deficiency 08/31/2014  . Crohn's disease ([place]) 12/27/2013  . Airway hyperreactivity 06/11/2013  . 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 EPIDERMOID CYST 02/15/2008  . Osteopenia 10/09/2007   Past Surgical History:  Procedure Laterality Date  . COLECTOMY    . COLONOSCOPY W/ BIOPSIES  04/16/2006   crohn's colitis  . FLEXIBLE SIGMOIDOSCOPY  2006 - 02/2014   ileocolitis, pouchitis.  with pouch ulcer.   . FUNCTIONAL ENDOSCOPIC SINUS SURGERY  12/2003   partial ethmoidectomy.  Dr Wilburn Cornelia  . ileoanal pull-through    . LUMBAR LAMINECTOMY/DECOMPRESSION MICRODISCECTOMY Right 10/13/2015   Procedure: Right Lumbar four- five Microdiskectomy;  Surgeon: Erline Levine, MD;  Location: [facility];  Service: Neurosurgery;  Laterality: Right;  Right L4-5 Microdiskectomy  . sacroilitis    . SIGMOIDOSCOPY  01/2005  . VENTRAL HERNIA REPAIR  06/2014    Family History  Problem Relation Age of Onset  . Ulcerative colitis Mother   . Colon cancer Paternal Grandmother   . Brain cancer Paternal Grandmother        mets from colon    Medications- reviewed and updated Current Outpatient Medications  Medication Sig Dispense Refill  . AMBULATORY NON FORMULARY MEDICATION Inject into the vein. Iron Infusion  monthly    . budesonide-formoterol (SYMBICORT) 160-4.5 MCG/ACT inhaler Inhale 1 puff into the lungs 2 (two) times daily. 1 Inhaler 12  . celecoxib (CELEBREX) 100 MG capsule TAKE 1 CAPSULE BY MOUTH 2 TIMES DAILY AS NEEDED 60 capsule 5  . cholecalciferol (VITAMIN D) 1000 units tablet Take 1,000 Units by mouth daily.    Marland Kitchen HUMIRA PEN 40 MG/0.8ML PNKT INJECT ONE PEN SUBCUTANEOUSLY EVERY OTHER WEEK. REFRIGERATE. 2 each 6  . lansoprazole (PREVACID) 30 MG  capsule Take 1 capsule (30 mg total) by mouth 2 (two) times daily. 180 capsule 2  . Vitamin D, Ergocalciferol, (DRISDOL) 1.25 MG (50000 UT) CAPS capsule Take 1 capsule (50,000 Units total) by mouth every 7 (seven) days. 12 capsule 0  . zolmitriptan (ZOMIG-ZMT) 5 MG disintegrating tablet DISSOLVE 1 TABLET BY MOUTH AS NEEDED AS DIRECTED 20 tablet 0   No current facility-administered medications for this visit.     Allergies-reviewed and updated Allergies  Allergen Reactions  . Mercaptopurine Nausea And Vomiting    Felt ill within 2 days of initiating therapy.    Social History   Socioeconomic History  . Marital status: Divorced    Spouse name: Not on file  . Number of children: 2  . Years of education: 16 at least  . Highest education level: Not on file  Occupational History  . Occupation: Surveyor, quantity: [employer]  . Financial resource strain: Not on file  . Food insecurity    Worry: Not on file    Inability: Not on file  . Transportation needs    Medical: Not on file    Non-medical: Not on file  Tobacco  Use  . Smoking status: Never Smoker  . Smokeless tobacco: Never Used  Substance and Sexual Activity  . Alcohol use: Yes    Alcohol/week: 14.0 standard drinks    Types: 14 Glasses of wine per week    Comment: couple of drinks daily  . Drug use: No  . Sexual activity: Yes  Lifestyle  . Physical activity    Days per week: Not on file    Minutes per session: Not on file  . Stress: Not on file  Relationships  . Social Herbalist on phone: Not on file    Gets together: Not on file    Attends religious service: Not on file    Active member of club or organization: Not on file    Attends meetings of clubs or organizations: Not on file    Relationship status: Not on file  Other Topics Concern  . Not on file  Social History Narrative   Father a pulmonologist in [place].   Married to Dr. Honor Junes daughter Gerald Stabs - separated  2017   Wife is CRNA   2 sons   + EtOH   No tobacco   No drugs        Objective:  Physical Exam: BP 110/78   Pulse 67   Temp 97.7 F (36.5 C)   Ht _0  (1.753 m)   Wt 158 lb 4 oz (71.8 kg)   SpO2 99%   BMI 23.37 kg/m   Body mass index is 23.37 kg/m. Wt Readings from Last 3 Encounters:  06/15/19 158 lb 4 oz (71.8 kg)  05/25/19 157 lb (71.2 kg)  03/18/19 146 lb (66.2 kg)   Gen: NAD, resting comfortably HEENT: TMs normal bilaterally. OP clear. No thyromegaly noted.  CV: RRR with no murmurs appreciated Pulm: NWOB, CTAB with no crackles, wheezes, or rhonchi GI: Normal bowel sounds present. Soft, Nontender, Nondistended. MSK: no edema, cyanosis, or clubbing noted Skin: warm, dry Neuro: CN2-12 grossly intact. Strength 5/5 in upper and lower extremities. Reflexes symmetric and intact bilaterally.  Psych: Normal affect and thought content      M. [name], MD 06/15/2019 9:26 AM

## 2019-06-17 LAB — TSH: TSH: 1.99 u[IU]/mL (ref 0.35–4.50)

## 2019-06-17 LAB — VITAMIN B12: Vitamin B-12: 457 pg/mL (ref 211–911)

## 2019-06-17 LAB — IBC + FERRITIN
Ferritin: 10 ng/mL — ABNORMAL LOW (ref 22.0–322.0)
Iron: 76 ug/dL (ref 42–165)
Saturation Ratios: 17.3 % — ABNORMAL LOW (ref 20.0–50.0)
Transferrin: 314 mg/dL (ref 212.0–360.0)

## 2019-06-17 LAB — VITAMIN D 25 HYDROXY (VIT D DEFICIENCY, FRACTURES): VITD: 35.11 ng/mL (ref 30.00–100.00)

## 2019-06-18 LAB — QUANTIFERON-TB GOLD PLUS
Mitogen-NIL: >10 IU/mL
NIL: 0.06 IU/mL
QuantiFERON-TB Gold Plus: NEGATIVE
TB1-NIL: 0.01 IU/mL
TB2-NIL: 0.01 IU/mL

## 2019-06-21 ENCOUNTER — Other Ambulatory Visit: Payer: Self-pay

## 2019-06-21 DIAGNOSIS — R17 Unspecified jaundice: Secondary | ICD-10-CM

## 2019-06-21 NOTE — Progress Notes (Signed)
Please inform patient of the following:  Iron levels are a little low. Recommend iron supplement if he is not doing so already.  His biliruibin level is a little high. I think this is probably nothing to worry about, but would like for him to come back to repeat testing to make sure. Please place future order for fractionated bilirubin.  All of his other labs are NORMAL.  Charles [name]. [name], MD 06/21/2019 10:41 AM

## 2019-06-22 ENCOUNTER — Encounter: Payer: Self-pay | Admitting: Family Medicine

## 2019-07-06 ENCOUNTER — Ambulatory Visit (INDEPENDENT_AMBULATORY_CARE_PROVIDER_SITE_OTHER): Payer: 59 | Admitting: Internal Medicine

## 2019-07-06 ENCOUNTER — Encounter: Payer: Self-pay | Admitting: Family Medicine

## 2019-07-06 ENCOUNTER — Encounter: Payer: Self-pay | Admitting: Internal Medicine

## 2019-07-06 ENCOUNTER — Other Ambulatory Visit: Payer: Self-pay

## 2019-07-06 DIAGNOSIS — K91850 Pouchitis: Secondary | ICD-10-CM

## 2019-07-06 DIAGNOSIS — K219 Gastro-esophageal reflux disease without esophagitis: Secondary | ICD-10-CM | POA: Diagnosis not present

## 2019-07-06 DIAGNOSIS — K50118 Crohn's disease of large intestine with other complication: Secondary | ICD-10-CM | POA: Diagnosis not present

## 2019-07-06 NOTE — Assessment & Plan Note (Signed)
No complaints on PPI will continue

## 2019-07-06 NOTE — Assessment & Plan Note (Addendum)
Doing well on Humira RTC 9-12 mos

## 2019-07-06 NOTE — Assessment & Plan Note (Signed)
No need to recheck

## 2019-07-06 NOTE — Assessment & Plan Note (Addendum)
Doing well on Humira, continue.  I do not think we need to look at his pouch consider again next year.

## 2019-07-06 NOTE — Progress Notes (Signed)
Charles [name] 48 y.o. 1970/12/17 [id number]  Assessment & Plan:   Crohn's disease (Charles [name]) Doing well on Humira RTC 9-12 mos  Ileal pouchitis (Charles [name]) Doing well on Humira, continue.  I do not think we need to look at his pouch consider again next year.   Charles [name] syndrome No need to recheck  GERD (gastroesophageal reflux disease) No complaints on PPI will continue     Subjective:   Chief Complaint:  HPI Charles [name] is here for follow-up last visit was in February.  He has really been doing well.  As well he has not has in a long time.  Bowel movements are not a problem no significant diarrhea.  He is wondering about whether we should look into his pouch again as we have been doing about every 2 years.  Continues on his Humira QuantiFERON was negative in October when he saw Charles [name].  He did have an elevated bilirubin he has Gilbert's syndrome and the bilirubin was higher than it ever had been and Charles [name] wants to recheck that though it remains unfractionated.    Allergies  Allergen Reactions  . Mercaptopurine Nausea And Vomiting    Felt ill within 2 days of initiating therapy.   Current Meds  Medication Sig  . AMBULATORY NON FORMULARY MEDICATION Inject into the vein. Iron Infusion  monthly  . budesonide-formoterol (SYMBICORT) 160-4.5 MCG/ACT inhaler Inhale 1 puff into the lungs 2 (two) times daily.  . celecoxib (CELEBREX) 100 MG capsule TAKE 1 CAPSULE BY MOUTH 2 TIMES DAILY AS NEEDED  . cholecalciferol (VITAMIN D) 1000 units tablet Take 1,000 Units by mouth daily.  Marland Kitchen HUMIRA PEN 40 MG/0.8ML PNKT INJECT ONE PEN SUBCUTANEOUSLY EVERY OTHER WEEK. REFRIGERATE.  Marland Kitchen lansoprazole (PREVACID) 30 MG capsule Take 1 capsule (30 mg total) by mouth 2 (two) times daily.  . Vitamin D, Ergocalciferol, (DRISDOL) 1.25 MG (50000 UT) CAPS capsule Take 1 capsule (50,000 Units total) by mouth every 7 (seven) days.  Marland Kitchen zolmitriptan (ZOMIG-ZMT) 5 MG disintegrating tablet DISSOLVE 1 TABLET BY MOUTH AS  NEEDED AS DIRECTED   Past Medical History:  Diagnosis Date  . Allergy   . Arthritis   . Arthropathy    and scroilitis  . Asthma   . Blood transfusion without reported diagnosis   . Chronic drug-induced interstitial lung disorders ([place])   . Crohn's ileocolitis ([place]) 08/29/2008  . Dysplastic nevi   . GERD (gastroesophageal reflux disease)   . Herniated disc   . History of proctitis 2006  . IBD (inflammatory bowel disease)   . Incisional hernia   . Migraine headache   . Morton's neuroma of right foot   . Osteopenia   . Pouchitis ([place]) 2006  . Small bowel obstruction due to adhesions ([place]) 02/09/2015  . Thyroid disease   . Vitamin D deficiency    Past Surgical History:  Procedure Laterality Date  . COLECTOMY    . COLONOSCOPY W/ BIOPSIES  04/16/2006   crohn's colitis  . FLEXIBLE SIGMOIDOSCOPY  2006 - 02/2014   ileocolitis, pouchitis.  with pouch ulcer.   . FUNCTIONAL ENDOSCOPIC SINUS SURGERY  12/2003   partial ethmoidectomy.  Dr Wilburn Cornelia  . ileoanal pull-through    . LUMBAR LAMINECTOMY/DECOMPRESSION MICRODISCECTOMY Right 10/13/2015   Procedure: Right Lumbar four- five Microdiskectomy;  Surgeon: Erline Levine, MD;  Location: [facility];  Service: Neurosurgery;  Laterality: Right;  Right L4-5 Microdiskectomy  . sacroilitis    . SIGMOIDOSCOPY  01/2005  . VENTRAL HERNIA REPAIR  06/2014  Social History   Social History Narrative   Father a Technical brewer in [place].   Married to Charles [name] daughter Charles [name] - separated 2017   Wife is CRNA   2 sons   + EtOH   No tobacco   No drugs   family history includes Brain cancer in his paternal grandmother; Colon cancer in his paternal grandmother; Ulcerative colitis in his mother.   Review of Systems As above  Objective:   Physical Exam BP 110/64   Pulse 72   Temp 97.9 F (36.6 C) (Temporal)   Ht 5' 10"  (1.778 m)   Wt 157 lb (71.2 kg)   BMI 22.53 kg/m  No acute distress well-developed well-nourished   15 minutes time spent  with patient > half in counseling coordination of care

## 2019-07-06 NOTE — Patient Instructions (Signed)
I am glad things are going well.  We can think about doing an examination of the pouch next year but if you continue to do well I am not sure that is necessary.   We should see each other again in 9 to 12 months sooner if needed (hope not).   As far as rechecking the bilirubin I will message Dr. [name] I do not think that is necessary.   I appreciate the opportunity to care for you. Gatha Mayer, MD, [facility]

## 2019-07-07 ENCOUNTER — Other Ambulatory Visit: Payer: Self-pay

## 2019-07-08 ENCOUNTER — Other Ambulatory Visit (INDEPENDENT_AMBULATORY_CARE_PROVIDER_SITE_OTHER): Payer: 59

## 2019-07-08 ENCOUNTER — Other Ambulatory Visit: Payer: 59

## 2019-07-08 DIAGNOSIS — R17 Unspecified jaundice: Secondary | ICD-10-CM

## 2019-07-09 LAB — BILIRUBIN, FRACTIONATED(TOT/DIR/INDIR)
Bilirubin, Direct: 0.3 mg/dL — ABNORMAL HIGH (ref 0.0–0.2)
Indirect Bilirubin: 1.3 mg/dL (calc) — ABNORMAL HIGH (ref 0.2–1.2)
Total Bilirubin: 1.6 mg/dL — ABNORMAL HIGH (ref 0.2–1.2)

## 2019-07-09 NOTE — Progress Notes (Signed)
Please inform patient of the following:  Bilirubin levels are improved since last check. Do not need to do any other testing at this point.  [name]. [name], MD 07/09/2019 9:53 AM

## 2019-07-27 ENCOUNTER — Ambulatory Visit (INDEPENDENT_AMBULATORY_CARE_PROVIDER_SITE_OTHER): Payer: 59 | Admitting: Family Medicine

## 2019-07-27 ENCOUNTER — Encounter: Payer: Self-pay | Admitting: Family Medicine

## 2019-07-27 ENCOUNTER — Other Ambulatory Visit: Payer: Self-pay

## 2019-07-27 VITALS — BP 120/86 | HR 81 | Ht 70.0 in | Wt 155.0 lb

## 2019-07-27 DIAGNOSIS — M5416 Radiculopathy, lumbar region: Secondary | ICD-10-CM | POA: Diagnosis not present

## 2019-07-27 DIAGNOSIS — M999 Biomechanical lesion, unspecified: Secondary | ICD-10-CM | POA: Diagnosis not present

## 2019-07-27 NOTE — Assessment & Plan Note (Signed)
Decision today to treat with OMT was based on Physical Exam  After verbal consent patient was treated with HVLA, ME, FPR techniques in cervical, thoracic, rib,  lumbar and sacral areas  Patient tolerated the procedure well with improvement in symptoms  Patient given exercises, stretches and lifestyle modifications  See medications in patient instructions if given  Patient will follow up in 4-8 weeks 

## 2019-07-27 NOTE — Progress Notes (Signed)
[facility] [address] [name], [place] [address] Phone: [phone] Subjective:   Charles [name], am serving as a scribe for Dr. Hulan Saas.   This visit occurred during the SARS-CoV-2 public health emergency.  Safety protocols were in place, including screening questions prior to the visit, additional usage of staff PPE, and extensive cleaning of exam room while observing appropriate contact time as indicated for disinfecting solutions.    CC: Low back pain  IOE:[id number]  Charles [name] is a 48 y.o. male coming in with complaint of back pain. Last seen on 05/25/2019 for OMT. Patient states overall doing relatively well.  Has been running a more regular basis.  Patient since then seems to be feeling much better overall.  Patient denies any tingling or numbness.      Past Medical History:  Diagnosis Date  . Allergy   . Arthritis   . Arthropathy    and scroilitis  . Asthma   . Blood transfusion without reported diagnosis   . 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 Crohn's ileocolitis ([place]) 08/29/2008  . Dysplastic nevi   . GERD (gastroesophageal reflux disease)   . Herniated disc   . History of proctitis 2006  . IBD (inflammatory bowel disease)   . Incisional hernia   . Migraine headache   . Morton's neuroma of right foot   . Osteopenia   . Pouchitis ([place]) 2006  . Small bowel obstruction due to adhesions ([place]) 02/09/2015  . Thyroid disease   . Vitamin D deficiency    Past Surgical History:  Procedure Laterality Date  . COLECTOMY    . COLONOSCOPY W/ BIOPSIES  04/16/2006   crohn's colitis  . FLEXIBLE SIGMOIDOSCOPY  2006 - 02/2014   ileocolitis, pouchitis.  with pouch ulcer.   . FUNCTIONAL ENDOSCOPIC SINUS SURGERY  12/2003   partial ethmoidectomy.  Dr Wilburn Cornelia  . ileoanal pull-through    . LUMBAR LAMINECTOMY/DECOMPRESSION MICRODISCECTOMY Right 10/13/2015   Procedure: Right Lumbar four- five Microdiskectomy;  Surgeon:  Erline Levine, MD;  Location: [facility];  Service: Neurosurgery;  Laterality: Right;  Right L4-5 Microdiskectomy  . sacroilitis    . SIGMOIDOSCOPY  01/2005  . VENTRAL HERNIA REPAIR  06/2014   Social History   Socioeconomic History  . Marital status: Divorced    Spouse name: Not on file  . Number of children: 2  . Years of education: 16 at least  . Highest education level: Not on file  Occupational History  . Occupation: Surveyor, quantity: [employer]  . Financial resource strain: Not on file  . Food insecurity    Worry: Not on file    Inability: Not on file  . Transportation needs    Medical: Not on file    Non-medical: Not on file  Tobacco Use  . Smoking status: Never Smoker  . Smokeless tobacco: Never Used  Substance and Sexual Activity  . Alcohol use: Yes    Alcohol/week: 14.0 standard drinks    Types: 14 Glasses of wine per week    Comment: couple of drinks daily  . Drug use: [name]  . Sexual activity: Yes  Lifestyle  . Physical activity    Days per week: Not on file    Minutes per session: Not on file  . Stress: Not on file  Relationships  . Social Herbalist on phone: Not on file    Gets  together: Not on file    Attends religious service: Not on file    Active member of club or organization: Not on file    Attends meetings of clubs or organizations: Not on file    Relationship status: Not on file  Other Topics Concern  . Not on file  Social History Narrative   Father a pulmonologist in [place].   Married to Dr. Honor Junes daughter Gerald Stabs - separated 2017   Wife is CRNA   2 sons   + EtOH   [name] tobacco   [name] drugs   Allergies  Allergen Reactions  . Mercaptopurine Nausea And Vomiting    Felt ill within 2 days of initiating therapy.   Family History  Problem Relation Age of Onset  . Ulcerative colitis Mother   . Colon cancer Paternal Grandmother   . Brain cancer Paternal Grandmother        mets from colon       Current Outpatient Medications (Respiratory):  .  budesonide-formoterol (SYMBICORT) 160-4.5 MCG/ACT inhaler, Inhale 1 puff into the lungs 2 (two) times daily.  Current Outpatient Medications (Analgesics):  .  celecoxib (CELEBREX) 100 MG capsule, TAKE 1 CAPSULE BY MOUTH 2 TIMES DAILY AS NEEDED .  HUMIRA PEN 40 MG/0.8ML PNKT, INJECT ONE PEN SUBCUTANEOUSLY EVERY OTHER WEEK. REFRIGERATE. Marland Kitchen  zolmitriptan (ZOMIG-ZMT) 5 MG disintegrating tablet, DISSOLVE 1 TABLET BY MOUTH AS NEEDED AS DIRECTED   Current Outpatient Medications (Other):  Marland Kitchen  AMBULATORY NON FORMULARY MEDICATION, Inject into the vein. Iron Infusion  monthly .  cholecalciferol (VITAMIN D) 1000 units tablet, Take 1,000 Units by mouth daily. .  lansoprazole (PREVACID) 30 MG capsule, Take 1 capsule (30 mg total) by mouth 2 (two) times daily. .  Vitamin D, Ergocalciferol, (DRISDOL) 1.25 MG (50000 UT) CAPS capsule, Take 1 capsule (50,000 Units total) by mouth every 7 (seven) days.    Past medical history, social, surgical and family history all reviewed in electronic medical record.  [name] pertanent information unless stated regarding to the chief complaint.   Review of Systems:  [name] headache, visual changes, nausea, vomiting, diarrhea, constipation, dizziness, abdominal pain, skin rash, fevers, chills, night sweats, weight loss, swollen lymph nodes, body aches, joint swelling,, chest pain, shortness of breath, mood changes.  Mild positive muscle aches  Objective  Blood pressure 120/86, pulse 81, height 5' 10"  (1.778 m), weight 155 lb (70.3 kg), SpO2 98 %.    General: [name] apparent distress alert and oriented x3 mood and affect normal, dressed appropriately.  HEENT: Pupils equal, extraocular movements intact  Respiratory: Patient's speak in full sentences and does not appear short of breath  Cardiovascular: [name] lower extremity edema, non tender, [name] erythema  Skin: Warm dry intact with [name] signs of infection or rash on extremities or on axial  skeleton.  Abdomen: Soft nontender  Neuro: Cranial nerves II through XII are intact, neurovascularly intact in all extremities with 2+ DTRs and 2+ pulses.  Lymph: [name] lymphadenopathy of posterior or anterior cervical chain or axillae bilaterally.  Gait normal with good balance and coordination.  MSK:  Non tender with full range of motion and good stability and symmetric strength and tone of shoulders, elbows, wrist, hip, knee and ankles bilaterally.  Back Exam:  Inspection: Mild loss of lordosis Motion: Flexion 40 deg, Extension 25 deg, Side Bending to 45 deg bilaterally,  Rotation to 45 deg bilaterally  SLR laying: Negative  XSLR laying: Negative  Palpable tenderness: Tender to palpation in paraspinal musculature lumbar spine right  greater than left. FABER: Tightness bilaterally. Sensory change: Gross sensation intact to all lumbar and sacral dermatomes.  Reflexes: 2+ at both patellar tendons, 2+ at achilles tendons, Babinski's downgoing.  Strength at foot  Plantar-flexion: 5/5 Dorsi-flexion: 5/5 Eversion: 5/5 Inversion: 5/5  Leg strength  Quad: 5/5 Hamstring: 5/5 Hip flexor: 5/5 Hip abductors: 5/5   Osteopathic findings  C6 flexed rotated and side bent left T3 extended rotated and side bent right inhaled third rib T5 extended rotated and side bent left L2 flexed rotated and side bent right Sacrum right on right .   Impression and Recommendations:     This case required medical decision making of moderate complexity. The above documentation has been reviewed and is accurate and complete Lyndal Pulley, DO       Note: This dictation was prepared with Dragon dictation along with smaller phrase technology. Any transcriptional errors that result from this process are unintentional.

## 2019-07-27 NOTE — Assessment & Plan Note (Signed)
Continues to do relatively well at this point with no significant radicular symptoms.  Patient is responding fairly well to home exercises and icing regimen.  Discussed which activities to do which wants to avoid.  Follow-up again in 4 to 8 weeks

## 2019-07-27 NOTE — Patient Instructions (Signed)
  [address] 1st floor [place], [place] [address] Phone [phone]  See me in 2 months

## 2019-08-30 ENCOUNTER — Encounter: Payer: Self-pay | Admitting: Internal Medicine

## 2019-09-05 ENCOUNTER — Encounter: Payer: Self-pay | Admitting: Family Medicine

## 2019-09-06 ENCOUNTER — Other Ambulatory Visit: Payer: Self-pay

## 2019-09-06 MED ORDER — VITAMIN D (ERGOCALCIFEROL) 1.25 MG (50000 UNIT) PO CAPS: 50000.0000 [IU] | ORAL_CAPSULE | ORAL | 0 refills | Status: DC

## 2019-09-29 ENCOUNTER — Encounter: Payer: Self-pay | Admitting: Family Medicine

## 2019-09-30 ENCOUNTER — Encounter: Payer: Self-pay | Admitting: Family Medicine

## 2019-09-30 ENCOUNTER — Other Ambulatory Visit: Payer: Self-pay

## 2019-09-30 ENCOUNTER — Ambulatory Visit (INDEPENDENT_AMBULATORY_CARE_PROVIDER_SITE_OTHER): Payer: 59 | Admitting: Family Medicine

## 2019-09-30 VITALS — BP 100/70 | HR 115 | Ht 70.0 in | Wt 153.0 lb

## 2019-09-30 DIAGNOSIS — M5416 Radiculopathy, lumbar region: Secondary | ICD-10-CM | POA: Diagnosis not present

## 2019-09-30 DIAGNOSIS — M999 Biomechanical lesion, unspecified: Secondary | ICD-10-CM

## 2019-09-30 NOTE — Progress Notes (Signed)
[facility] [address] [place] Phone: [phone] Subjective:   I [name] am serving as a Education administrator for Dr. Hulan Saas.  This visit occurred during the SARS-CoV-2 public health emergency.  Safety protocols were in place, including screening questions prior to the visit, additional usage of staff PPE, and extensive cleaning of exam room while observing appropriate contact time as indicated for disinfecting solutions.   I'm seeing this patient by the request  of:  Vivi Barrack, MD  CC: Back pain follow-up  [id number]  Charles [name] is a 49 y.o. male coming in with complaint of back pain. Last seen on 07/27/2019 for OMT. Patient states he is feeling great.  Patient has been running.  Went skiing for the first time in the night last 7 years.  Seems to be doing relatively well overall.       Past Medical History:  Diagnosis Date  . Allergy   . Arthritis   . Arthropathy    and scroilitis  . Asthma   . Blood transfusion without reported diagnosis   . 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 Crohn's ileocolitis ([place]) 08/29/2008  . Dysplastic nevi   . GERD (gastroesophageal reflux disease)   . Herniated disc   . History of proctitis 2006  . IBD (inflammatory bowel disease)   . Incisional hernia   . Migraine headache   . Morton's neuroma of right foot   . Osteopenia   . Pouchitis ([place]) 2006  . Small bowel obstruction due to adhesions ([place]) 02/09/2015  . Thyroid disease   . Vitamin D deficiency    Past Surgical History:  Procedure Laterality Date  . COLECTOMY    . COLONOSCOPY W/ BIOPSIES  04/16/2006   crohn's colitis  . FLEXIBLE SIGMOIDOSCOPY  2006 - 02/2014   ileocolitis, pouchitis.  with pouch ulcer.   . FUNCTIONAL ENDOSCOPIC SINUS SURGERY  12/2003   partial ethmoidectomy.  Dr Wilburn Cornelia  . ileoanal pull-through    . LUMBAR LAMINECTOMY/DECOMPRESSION MICRODISCECTOMY Right 10/13/2015   Procedure:  Right Lumbar four- five Microdiskectomy;  Surgeon: Erline Levine, MD;  Location: [facility];  Service: Neurosurgery;  Laterality: Right;  Right L4-5 Microdiskectomy  . sacroilitis    . SIGMOIDOSCOPY  01/2005  . VENTRAL HERNIA REPAIR  06/2014   Social History   Socioeconomic History  . Marital status: Divorced    Spouse name: Not on file  . Number of children: 2  . Years of education: 16 at least  . Highest education level: Not on file  Occupational History  . Occupation: Surveyor, quantity: [employer]  Tobacco Use  . Smoking status: Never Smoker  . Smokeless tobacco: Never Used  Substance and Sexual Activity  . Alcohol use: Yes    Alcohol/week: 14.0 standard drinks    Types: 14 Glasses of wine per week    Comment: couple of drinks daily  . Drug use: No  . Sexual activity: Yes  Other Topics Concern  . Not on file  Social History Narrative   Father a pulmonologist in [place].   Married to Dr. Honor Junes daughter Gerald Stabs - separated 2017   Wife is CRNA   2 sons   + EtOH   No tobacco   No drugs   Social Determinants of Health   Financial Resource Strain:   . Difficulty of Paying Living Expenses: Not on file  Food Insecurity:   . Worried About Running  Out of Food in the Last Year: Not on file  . Ran Out of Food in the Last Year: Not on file  Transportation Needs:   . Lack of Transportation (Medical): Not on file  . Lack of Transportation (Non-Medical): Not on file  Physical Activity:   . Days of Exercise per Week: Not on file  . Minutes of Exercise per Session: Not on file  Stress:   . Feeling of Stress : Not on file  Social Connections:   . Frequency of Communication with Friends and Family: Not on file  . Frequency of Social Gatherings with Friends and Family: Not on file  . Attends Religious Services: Not on file  . Active Member of Clubs or Organizations: Not on file  . Attends Archivist Meetings: Not on file  . Marital Status: Not on  file   Allergies  Allergen Reactions  . Mercaptopurine Nausea And Vomiting    Felt ill within 2 days of initiating therapy.   Family History  Problem Relation Age of Onset  . Ulcerative colitis Mother   . Colon cancer Paternal Grandmother   . Brain cancer Paternal Grandmother        mets from colon      Current Outpatient Medications (Respiratory):  .  budesonide-formoterol (SYMBICORT) 160-4.5 MCG/ACT inhaler, Inhale 1 puff into the lungs 2 (two) times daily.  Current Outpatient Medications (Analgesics):  .  celecoxib (CELEBREX) 100 MG capsule, TAKE 1 CAPSULE BY MOUTH 2 TIMES DAILY AS NEEDED .  HUMIRA PEN 40 MG/0.8ML PNKT, INJECT ONE PEN SUBCUTANEOUSLY EVERY OTHER WEEK. REFRIGERATE. Marland Kitchen  zolmitriptan (ZOMIG-ZMT) 5 MG disintegrating tablet, DISSOLVE 1 TABLET BY MOUTH AS NEEDED AS DIRECTED   Current Outpatient Medications (Other):  Marland Kitchen  AMBULATORY NON FORMULARY MEDICATION, Inject into the vein. Iron Infusion  monthly .  cholecalciferol (VITAMIN D) 1000 units tablet, Take 1,000 Units by mouth daily. .  lansoprazole (PREVACID) 30 MG capsule, Take 1 capsule (30 mg total) by mouth 2 (two) times daily. .  Vitamin D, Ergocalciferol, (DRISDOL) 1.25 MG (50000 UNIT) CAPS capsule, Take 1 capsule (50,000 Units total) by mouth every 7 (seven) days.   Reviewed prior external information including notes and imaging from  primary care provider As well as notes that were available from care everywhere and other healthcare systems.  Past medical history, social, surgical and family history all reviewed in electronic medical record.  No pertanent information unless stated regarding to the chief complaint.   Review of Systems:  No headache, visual changes, nausea, vomiting, diarrhea, constipation, dizziness, abdominal pain, skin rash, fevers, chills, night sweats, weight loss, swollen lymph nodes, body aches, joint swelling, chest pain, shortness of breath, mood changes. POSITIVE muscle  aches  Objective  Blood pressure 100/70, pulse (!) 115, height 5' 10"  (1.778 m), weight 153 lb (69.4 kg), SpO2 98 %.   General: No apparent distress alert and oriented x3 mood and affect normal, dressed appropriately.  HEENT: Pupils equal, extraocular movements intact  Respiratory: Patient's speak in full sentences and does not appear short of breath  Cardiovascular: No lower extremity edema, non tender, no erythema  Skin: Warm dry intact with no signs of infection or rash on extremities or on axial skeleton.  Abdomen: Soft nontender  Neuro: Cranial nerves II through XII are intact, neurovascularly intact in all extremities with 2+ DTRs and 2+ pulses.  Lymph: No lymphadenopathy of posterior or anterior cervical chain or axillae bilaterally.  Gait normal with good balance and coordination.  MSK:  Non tender with full range of motion and good stability and symmetric strength and tone of shoulders, elbows, wrist, hip, knee and ankles bilaterally.  Neck: Inspection unremarkable. No palpable stepoffs. Negative Spurling's maneuver. Mild limited sidebending bilaterally Grip strength and sensation normal in bilateral hands Strength good C4 to T1 distribution No sensory change to C4 to T1 Negative Hoffman sign bilaterally Reflexes normal Mild tightness in the parascapular region bilaterally right greater than left  Back Exam:  Inspection: Palpation paraspinal musculature. Motion: Flexion 45 deg, Extension 30 deg, Side Bending to 35 deg bilaterally,  Rotation to 40 deg bilaterally  SLR laying: Negative  XSLR laying: Negative  Palpable tenderness: Tender to palpation of paraspinal musculature lumbar spine right greater than left. FABER: negative. Sensory change: Gross sensation intact to all lumbar and sacral dermatomes.  Reflexes: 2+ at both patellar tendons, 2+ at achilles tendons, Babinski's downgoing.  Strength at foot  Plantar-flexion: 5/5 Dorsi-flexion: 5/5 Eversion: 5/5 Inversion:  5/5  Leg strength  Quad: 5/5 Hamstring: 5/5 Hip flexor: 5/5 Hip abductors: 5/5    Osteopathic findings C6 flexed rotated and side bent left T6 extended rotated and side bent right inhaled rib L1 flexed rotated and side bent right Sacrum right on right    Impression and Recommendations:     This case required medical decision making of moderate complexity. The above documentation has been reviewed and is accurate and complete Lyndal Pulley, DO       Note: This dictation was prepared with Dragon dictation along with smaller phrase technology. Any transcriptional errors that result from this process are unintentional.

## 2019-09-30 NOTE — Assessment & Plan Note (Signed)
Decision today to treat with OMT was based on Physical Exam  After verbal consent patient was treated with HVLA, ME, FPR techniques in cervical, thoracic, rib,  lumbar and sacral areas  Patient tolerated the procedure well with improvement in symptoms  Patient given exercises, stretches and lifestyle modifications  See medications in patient instructions if given  Patient will follow up in 4-8 weeks 

## 2019-09-30 NOTE — Patient Instructions (Signed)
Good to see you Stay active Try asics See me again in 6-8 weeks

## 2019-09-30 NOTE — Assessment & Plan Note (Signed)
Patient has been doing relatively well.  Chronic problem status post surgery but doing well with core strength home exercises and icing regimen.  Responds well to manipulation.  Follow-up again in 4 to 8 weeks

## 2019-10-06 ENCOUNTER — Encounter: Payer: Self-pay | Admitting: Family Medicine

## 2019-10-06 NOTE — Telephone Encounter (Signed)
Please call pt and schedule appt with Dr. [name].

## 2019-10-11 ENCOUNTER — Ambulatory Visit (INDEPENDENT_AMBULATORY_CARE_PROVIDER_SITE_OTHER): Payer: 59 | Admitting: Family Medicine

## 2019-10-11 DIAGNOSIS — R21 Rash and other nonspecific skin eruption: Secondary | ICD-10-CM | POA: Diagnosis not present

## 2019-10-11 DIAGNOSIS — J4520 Mild intermittent asthma, uncomplicated: Secondary | ICD-10-CM

## 2019-10-11 DIAGNOSIS — J309 Allergic rhinitis, unspecified: Secondary | ICD-10-CM

## 2019-10-11 MED ORDER — DESONIDE 0.05 % EX OINT: 1.0000 "application " | TOPICAL_OINTMENT | Freq: Two times a day (BID) | CUTANEOUS | 0 refills | Status: DC

## 2019-10-11 MED ORDER — PREDNISONE 50 MG PO TABS: ORAL_TABLET | ORAL | 0 refills | Status: DC

## 2019-10-11 NOTE — Progress Notes (Signed)
   Charles [name] is a 49 y.o. male who presents today for a virtual office visit.  Assessment/Plan:  New/Acute Problems: Rash No red flags.  Unable to make full examination via virtual visit however consistent with prior episodes of erythema nodosum.  Will start topical desonide.  Advised him to follow-up with dermatology if not proving the next week or so.  Chronic Problems Addressed Today: Airway hyperreactivity Mild flare.  We will continue Symbicort.  Also send in prednisone burst.  Allergic rhinitis Slightly worsened.  We will send in prednisone which should help.  He will continue using over-the-counter nasal irrigation and nasal sprays.     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  He has recently noticed a increasing rash on his penis.  Start exercise.  Tried using Aquaphor which has been helping over the past week.  No pain to the area.  Has seen dermatology in the past.  Has also tried topical hydrocortisone cream.  In addition, he has noticed increasing allergic rhinitis issues with increasing cough as well.  He has been trying to use his Symbicort.  Is also been using nasal rinses and nasal sprays.  Symptoms feel like a flareup of his underlying allergies and airway hyperreactivity.       Objective/Observations  Physical Exam: Gen: NAD, resting comfortably Pulm: Normal work of breathing Neuro: Grossly normal, moves all extremities Psych: Normal affect and thought content  Virtual Visit via Video   I connected with Charles [name] on 10/11/19 at 11:00 AM EST by a video enabled telemedicine application and verified that I am speaking with the correct person using two identifiers. The limitations of evaluation and management by telemedicine and the availability of in person appointments were discussed. The patient expressed understanding and agreed to proceed.   Patient location: Home Provider location: [place] participating in the virtual visit: Myself and Patient     Charles [name]. [name], MD 10/11/2019 11:37 AM

## 2019-10-11 NOTE — Assessment & Plan Note (Signed)
Slightly worsened.  We will send in prednisone which should help.  He will continue using over-the-counter nasal irrigation and nasal sprays.

## 2019-10-11 NOTE — Assessment & Plan Note (Signed)
Mild flare.  We will continue Symbicort.  Also send in prednisone burst.

## 2019-11-23 ENCOUNTER — Ambulatory Visit (INDEPENDENT_AMBULATORY_CARE_PROVIDER_SITE_OTHER): Payer: 59 | Admitting: Family Medicine

## 2019-11-23 ENCOUNTER — Other Ambulatory Visit: Payer: Self-pay

## 2019-11-23 ENCOUNTER — Encounter: Payer: Self-pay | Admitting: Family Medicine

## 2019-11-23 VITALS — BP 124/82 | HR 80 | Ht 70.0 in | Wt 155.0 lb

## 2019-11-23 DIAGNOSIS — M461 Sacroiliitis, not elsewhere classified: Secondary | ICD-10-CM

## 2019-11-23 DIAGNOSIS — M2550 Pain in unspecified joint: Secondary | ICD-10-CM

## 2019-11-23 DIAGNOSIS — M999 Biomechanical lesion, unspecified: Secondary | ICD-10-CM

## 2019-11-23 NOTE — Patient Instructions (Addendum)
Labs today Work on Teacher, early years/pre See me again in 7-8 weeks

## 2019-11-23 NOTE — Assessment & Plan Note (Signed)

## 2019-11-23 NOTE — Assessment & Plan Note (Signed)
We will continue to monitor.  Patient does have sacroiliitis with a history of ulcerative colitis with HLA-B27.  Discussed with patient about sedimentation rate and other laboratory work-up which patient has had difficulty previously with.  Patient will have this as well done in the near future.  Follow-up with me again in 4 to 8 weeks

## 2019-11-23 NOTE — Progress Notes (Signed)
[facility] [address] [place] Phone: [phone] Subjective:   I Charles [name] am serving as a Education administrator for Dr. Hulan Saas.  This visit occurred during the SARS-CoV-2 public health emergency.  Safety protocols were in place, including screening questions prior to the visit, additional usage of staff PPE, and extensive cleaning of exam room while observing appropriate contact time as indicated for disinfecting solutions.   I'm seeing this patient by the request  of:  Charles Barrack, MD  CC:   [id number]  Charles [name] is a 50 y.o. male coming in with complaint of neck and back pain.  Last seen 09/30/2019 for OMT. States he is feeling well today.  Mild tightness overall.  Patient continues to try to stay active.  Patient has noticed when he does a significant mount of running without guarding recovery starts having more discomfort and pain.     Past Medical History:  Diagnosis Date  . Allergy   . Arthritis   . Arthropathy    and scroilitis  . Asthma   . Blood transfusion without reported diagnosis   . 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 Crohn's ileocolitis ([place]) 08/29/2008  . Dysplastic nevi   . GERD (gastroesophageal reflux disease)   . Herniated disc   . History of proctitis 2006  . IBD (inflammatory bowel disease)   . Incisional hernia   . Migraine headache   . Morton's neuroma of right foot   . Osteopenia   . Pouchitis ([place]) 2006  . Small bowel obstruction due to adhesions ([place]) 02/09/2015  . Thyroid disease   . Vitamin D deficiency    Past Surgical History:  Procedure Laterality Date  . COLECTOMY    . COLONOSCOPY W/ BIOPSIES  04/16/2006   crohn's colitis  . FLEXIBLE SIGMOIDOSCOPY  2006 - 02/2014   ileocolitis, pouchitis.  with pouch ulcer.   . FUNCTIONAL ENDOSCOPIC SINUS SURGERY  12/2003   partial ethmoidectomy.  Dr Wilburn Cornelia  . ileoanal pull-through    . LUMBAR LAMINECTOMY/DECOMPRESSION  MICRODISCECTOMY Right 10/13/2015   Procedure: Right Lumbar four- five Microdiskectomy;  Surgeon: Erline Levine, MD;  Location: [facility];  Service: Neurosurgery;  Laterality: Right;  Right L4-5 Microdiskectomy  . sacroilitis    . SIGMOIDOSCOPY  01/2005  . VENTRAL HERNIA REPAIR  06/2014   Social History   Socioeconomic History  . Marital status: Divorced    Spouse name: Not on file  . Number of children: 2  . Years of education: 16 at least  . Highest education level: Not on file  Occupational History  . Occupation: Surveyor, quantity: [employer]  Tobacco Use  . Smoking status: Never Smoker  . Smokeless tobacco: Never Used  Substance and Sexual Activity  . Alcohol use: Yes    Alcohol/week: 14.0 standard drinks    Types: 14 Glasses of wine per week    Comment: couple of drinks daily  . Drug use: No  . Sexual activity: Yes  Other Topics Concern  . Not on file  Social History Narrative   Father a pulmonologist in [place].   Married to Dr. Honor Junes daughter Gerald Stabs - separated 2017   Wife is CRNA   2 sons   + EtOH   No tobacco   No drugs   Social Determinants of Health   Financial Resource Strain:   . Difficulty of Paying Living Expenses:   Food Insecurity:   .  Worried About Charity fundraiser in the Last Year:   . Arboriculturist in the Last Year:   Transportation Needs:   . Film/video editor (Medical):   Marland Kitchen Lack of Transportation (Non-Medical):   Physical Activity:   . Days of Exercise per Week:   . Minutes of Exercise per Session:   Stress:   . Feeling of Stress :   Social Connections:   . Frequency of Communication with Friends and Family:   . Frequency of Social Gatherings with Friends and Family:   . Attends Religious Services:   . Active Member of Clubs or Organizations:   . Attends Archivist Meetings:   Marland Kitchen Marital Status:    Allergies  Allergen Reactions  . Mercaptopurine Nausea And Vomiting    Felt ill within 2 days of  initiating therapy.   Family History  Problem Relation Age of Onset  . Ulcerative colitis Mother   . Colon cancer Paternal Grandmother   . Brain cancer Paternal Grandmother        mets from colon    Current Outpatient Medications (Endocrine & Metabolic):  .  predniSONE (DELTASONE) 50 MG tablet, Take 1 tablet daily for 5 days.   Current Outpatient Medications (Respiratory):  .  budesonide-formoterol (SYMBICORT) 160-4.5 MCG/ACT inhaler, Inhale 1 puff into the lungs 2 (two) times daily.  Current Outpatient Medications (Analgesics):  .  celecoxib (CELEBREX) 100 MG capsule, TAKE 1 CAPSULE BY MOUTH 2 TIMES DAILY AS NEEDED .  HUMIRA PEN 40 MG/0.8ML PNKT, INJECT ONE PEN SUBCUTANEOUSLY EVERY OTHER WEEK. REFRIGERATE. Marland Kitchen  zolmitriptan (ZOMIG-ZMT) 5 MG disintegrating tablet, DISSOLVE 1 TABLET BY MOUTH AS NEEDED AS DIRECTED   Current Outpatient Medications (Other):  Marland Kitchen  AMBULATORY NON FORMULARY MEDICATION, Inject into the vein. Iron Infusion  monthly .  cholecalciferol (VITAMIN D) 1000 units tablet, Take 1,000 Units by mouth daily. Marland Kitchen  desonide (DESOWEN) 0.05 % ointment, Apply 1 application topically 2 (two) times daily. .  lansoprazole (PREVACID) 30 MG capsule, Take 1 capsule (30 mg total) by mouth 2 (two) times daily. .  Vitamin D, Ergocalciferol, (DRISDOL) 1.25 MG (50000 UNIT) CAPS capsule, Take 1 capsule (50,000 Units total) by mouth every 7 (seven) days.   Reviewed prior external information including notes and imaging from  primary care provider As well as notes that were available from care everywhere and other healthcare systems.  Past medical history, social, surgical and family history all reviewed in electronic medical record.  No pertanent information unless stated regarding to the chief complaint.   Review of Systems:  No headache, visual changes, nausea, vomiting, diarrhea, constipation, dizziness, abdominal pain, skin rash, fevers, chills, night sweats, weight loss, swollen lymph  nodes, \ joint swelling, chest pain, shortness of breath, mood changes. POSITIVE muscle aches, body aches  Objective  Blood pressure 124/82, pulse 80, height 5' 10"  (1.778 m), weight 155 lb (70.3 kg), SpO2 98 %.   General: No apparent distress alert and oriented x3 mood and affect normal, dressed appropriately.  HEENT: Pupils equal, extraocular movements intact  Respiratory: Patient's speak in full sentences and does not appear short of breath  Cardiovascular: No lower extremity edema, non tender, no erythema  Neuro: Cranial nerves II through XII are intact, neurovascularly intact in all extremities with 2+ DTRs and 2+ pulses.  Gait normal with good balance and coordination.  MSK:  tender with full range of motion and good stability and symmetric strength and tone of shoulders, elbows, wrist, hip, knee  and ankles bilaterally.  Low back exam does have some tenderness to palpation of the paraspinal musculature lumbar spine right greater than left.  Negative straight leg test.  Mild tightness with Corky Sox test.  Osteopathic findings C2 flexed rotated and side bent right C4 flexed rotated and side bent left C6 flexed rotated and side bent left T3 extended rotated and side bent right inhaled third rib T9 extended rotated and side bent left L2 flexed rotated and side bent right Sacrum right on right    Impression and Recommendations:     This case required medical decision making of moderate complexity. The above documentation has been reviewed and is accurate and complete Lyndal Pulley, DO       Note: This dictation was prepared with Dragon dictation along with smaller phrase technology. Any transcriptional errors that result from this process are unintentional.

## 2019-11-30 ENCOUNTER — Other Ambulatory Visit: Payer: Self-pay | Admitting: Internal Medicine

## 2019-12-16 ENCOUNTER — Other Ambulatory Visit: Payer: Self-pay

## 2019-12-16 ENCOUNTER — Other Ambulatory Visit (INDEPENDENT_AMBULATORY_CARE_PROVIDER_SITE_OTHER): Payer: 59

## 2019-12-16 DIAGNOSIS — M2550 Pain in unspecified joint: Secondary | ICD-10-CM | POA: Diagnosis not present

## 2019-12-16 LAB — CBC WITH DIFFERENTIAL/PLATELET
Basophils Absolute: 0.1 10*3/uL (ref 0.0–0.1)
Basophils Relative: 1 % (ref 0.0–3.0)
Eosinophils Absolute: 0.2 10*3/uL (ref 0.0–0.7)
Eosinophils Relative: 4 % (ref 0.0–5.0)
HCT: 43.1 % (ref 39.0–52.0)
Hemoglobin: 14.5 g/dL (ref 13.0–17.0)
Lymphocytes Relative: 30.4 % (ref 12.0–46.0)
Lymphs Abs: 1.6 10*3/uL (ref 0.7–4.0)
MCHC: 33.7 g/dL (ref 30.0–36.0)
MCV: 91.8 fl (ref 78.0–100.0)
Monocytes Absolute: 0.5 10*3/uL (ref 0.1–1.0)
Monocytes Relative: 9.9 % (ref 3.0–12.0)
Neutro Abs: 2.9 10*3/uL (ref 1.4–7.7)
Neutrophils Relative %: 54.7 % (ref 43.0–77.0)
Platelets: 236 10*3/uL (ref 150.0–400.0)
RBC: 4.7 Mil/uL (ref 4.22–5.81)
RDW: 14.3 % (ref 11.5–15.5)
WBC: 5.3 10*3/uL (ref 4.0–10.5)

## 2019-12-16 LAB — IBC PANEL
Iron: 116 ug/dL (ref 42–165)
Saturation Ratios: 28.1 % (ref 20.0–50.0)
Transferrin: 295 mg/dL (ref 212.0–360.0)

## 2019-12-16 LAB — FERRITIN: Ferritin: 18.7 ng/mL — ABNORMAL LOW (ref 22.0–322.0)

## 2019-12-16 LAB — VITAMIN D 25 HYDROXY (VIT D DEFICIENCY, FRACTURES): VITD: 46.88 ng/mL (ref 30.00–100.00)

## 2019-12-16 LAB — PSA: PSA: 0.65 ng/mL (ref 0.10–4.00)

## 2019-12-17 LAB — SARS-COV-2 ANTIBODY(IGG)SPIKE,SEMI-QUANTITATIVE: SARS COV1 AB(IGG)SPIKE,SEMI QN: >20 index — ABNORMAL HIGH (ref <1.00)

## 2019-12-19 LAB — TESTOSTERONE, FREE, TOTAL, SHBG
Sex Hormone Binding: 37.9 nmol/L (ref 16.5–55.9)
Testosterone, Free: 11.2 pg/mL (ref 6.8–21.5)
Testosterone: 516 ng/dL (ref 264–916)

## 2019-12-27 ENCOUNTER — Other Ambulatory Visit: Payer: Self-pay | Admitting: *Deleted

## 2019-12-27 ENCOUNTER — Encounter: Payer: Self-pay | Admitting: Family Medicine

## 2019-12-27 DIAGNOSIS — R21 Rash and other nonspecific skin eruption: Secondary | ICD-10-CM

## 2019-12-27 NOTE — Telephone Encounter (Signed)
Pt requested Dermatology referral, Referral placed

## 2019-12-27 NOTE — Telephone Encounter (Signed)
Please advise 

## 2020-01-17 ENCOUNTER — Other Ambulatory Visit: Payer: Self-pay | Admitting: Family Medicine

## 2020-01-17 NOTE — Progress Notes (Signed)
[place] Phone: [phone] Subjective:   Fontaine No, am serving as a scribe for Dr. Hulan Saas. This visit occurred during the SARS-CoV-2 public health emergency.  Safety protocols were in place, including screening questions prior to the visit, additional usage of staff PPE, and extensive cleaning of exam room while observing appropriate contact time as indicated for disinfecting solutions.   I'm seeing this patient by the request  of:  Vivi Barrack, MD  CC: back pain follow up   [id number]  Gleason Ardoin is a 49 y.o. male coming in with complaint of back and neck pain. Last seen on 11/23/2019 for OMT.  Patient states overall doing relatively well.  Patient did do significant hiking in the mountains recently.  Did not have any difficulty and on the same day was able to  Medications patient has been prescribed: Zanaflex  Taking: Zanaflex very intermittently.  Continuing Celebrex intermittently as well         Reviewed prior external information including notes and imaging from previsou exam, outside providers and external EMR if available.   As well as notes that were available from care everywhere and other healthcare systems.  Past medical history, social, surgical and family history all reviewed in electronic medical record.  No pertanent information unless stated regarding to the chief complaint.   Past Medical History:  Diagnosis Date  . Allergy   . Arthritis   . Arthropathy    and scroilitis  . Asthma   . Blood transfusion without reported diagnosis   . 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 Crohn's ileocolitis ([place]) 08/29/2008  . Dysplastic nevi   . GERD (gastroesophageal reflux disease)   . Herniated disc   . History of proctitis 2006  . IBD (inflammatory bowel disease)   . Incisional hernia   . Migraine headache   . Morton's neuroma of right foot   .  Osteopenia   . Pouchitis ([place]) 2006  . Small bowel obstruction due to adhesions ([place]) 02/09/2015  . Thyroid disease   . Vitamin D deficiency     Allergies  Allergen Reactions  . Mercaptopurine Nausea And Vomiting    Felt ill within 2 days of initiating therapy.     Review of Systems:  No headache, visual changes, nausea, vomiting, diarrhea, constipation, dizziness, abdominal pain, skin rash, fevers, chills, night sweats, weight loss, swollen lymph nodes, body aches, joint swelling, chest pain, shortness of breath, mood changes. POSITIVE muscle aches  Objective  Blood pressure 120/82, pulse 72, height 5' 10"  (1.778 m), weight 155 lb (70.3 kg), SpO2 98 %.   General: No apparent distress alert and oriented x3 mood and affect normal, dressed appropriately.  HEENT: Pupils equal, extraocular movements intact  Respiratory: Patient's speak in full sentences and does not appear short of breath  Cardiovascular: No lower extremity edema, non tender, no erythema  Neuro: Cranial nerves II through XII are intact, neurovascularly intact in all extremities with 2+ DTRs and 2+ pulses.  Gait normal with good balance and coordination.  MSK:  Non tender with full range of motion and good stability and symmetric strength and tone of shoulders, elbows, wrist, hip, knee and ankles bilaterally.  Back -very mild tightness in the left scapular region.  Also tightness in the paraspinal musculature of the thoracolumbar juncture right greater than left.  Tightness with right straight leg test but no radicular symptoms.  Patient does have some  mild atrophy of the right calf compared to the contralateral side  Osteopathic findings  C2 flexed rotated and side bent right C6 flexed rotated and side bent left T3 extended rotated and side bent left T9 extended rotated and side bent left L2 flexed rotated and side bent right Sacrum right on right       Assessment and Plan: Low back pain.   Chronic problems  with exacerbation.  Patient was doing a lot of hiking.  Likely contributing to some of the discomfort and pain.  Medications: celebrex discussed also refilled Zanaflex  nonallopathic problems: Decision today to treat with OMT was based on Physical Exam  After verbal consent patient was treated with HVLA, ME, FPR techniques in cervical, rib, thoracic, lumbar, and sacral  areas   Patient tolerated the procedure well with improvement in symptoms  Patient given exercises, stretches and lifestyle modifications  See medications in patient instructions if given  Patient will follow up in 4-8 weeks      The above documentation has been reviewed and is accurate and complete Lyndal Pulley, DO       Note: This dictation was prepared with Dragon dictation along with smaller phrase technology. Any transcriptional errors that result from this process are unintentional.

## 2020-01-18 ENCOUNTER — Other Ambulatory Visit: Payer: Self-pay

## 2020-01-18 ENCOUNTER — Encounter: Payer: Self-pay | Admitting: Family Medicine

## 2020-01-18 ENCOUNTER — Ambulatory Visit (INDEPENDENT_AMBULATORY_CARE_PROVIDER_SITE_OTHER): Payer: 59 | Admitting: Family Medicine

## 2020-01-18 VITALS — BP 120/82 | HR 72 | Ht 70.0 in | Wt 155.0 lb

## 2020-01-18 DIAGNOSIS — M5416 Radiculopathy, lumbar region: Secondary | ICD-10-CM | POA: Diagnosis not present

## 2020-01-18 DIAGNOSIS — M461 Sacroiliitis, not elsewhere classified: Secondary | ICD-10-CM

## 2020-01-18 DIAGNOSIS — M999 Biomechanical lesion, unspecified: Secondary | ICD-10-CM | POA: Diagnosis not present

## 2020-01-18 MED ORDER — TIZANIDINE HCL 4 MG PO TABS: 4.0000 mg | ORAL_TABLET | Freq: Every evening | ORAL | 2 refills | Status: AC

## 2020-01-18 NOTE — Patient Instructions (Signed)
Overall fantastic Look into yoga wheel See me again in 2 months

## 2020-01-19 ENCOUNTER — Encounter: Payer: Self-pay | Admitting: Family Medicine

## 2020-01-20 MED ORDER — BUDESONIDE-FORMOTEROL FUMARATE 160-4.5 MCG/ACT IN AERO: INHALATION_SPRAY | RESPIRATORY_TRACT | 1 refills | Status: DC

## 2020-02-23 ENCOUNTER — Encounter: Payer: Self-pay | Admitting: Family Medicine

## 2020-02-24 ENCOUNTER — Encounter: Payer: Self-pay | Admitting: Family Medicine

## 2020-02-24 ENCOUNTER — Telehealth (INDEPENDENT_AMBULATORY_CARE_PROVIDER_SITE_OTHER): Payer: 59 | Admitting: Family Medicine

## 2020-02-24 DIAGNOSIS — D863 Sarcoidosis of skin: Secondary | ICD-10-CM

## 2020-02-24 DIAGNOSIS — J329 Chronic sinusitis, unspecified: Secondary | ICD-10-CM

## 2020-02-24 DIAGNOSIS — K50118 Crohn's disease of large intestine with other complication: Secondary | ICD-10-CM | POA: Diagnosis not present

## 2020-02-24 DIAGNOSIS — D869 Sarcoidosis, unspecified: Secondary | ICD-10-CM | POA: Insufficient documentation

## 2020-02-24 HISTORY — DX: Sarcoidosis of skin: D86.3

## 2020-02-24 MED ORDER — AZITHROMYCIN 250 MG PO TABS
ORAL_TABLET | ORAL | 0 refills | Status: DC
Start: 2020-02-24 — End: 2020-07-21

## 2020-02-24 MED ORDER — AZELASTINE HCL 0.1 % NA SOLN
2.0000 | Freq: Two times a day (BID) | NASAL | 12 refills | Status: DC
Start: 2020-02-24 — End: 2020-07-21

## 2020-02-24 MED ORDER — PREDNISONE 50 MG PO TABS: ORAL_TABLET | ORAL | 0 refills | Status: DC

## 2020-02-24 NOTE — Assessment & Plan Note (Signed)
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  Had steroid injection which seems to be helping.

## 2020-02-24 NOTE — Progress Notes (Signed)
   Charles [name] is a 49 y.o. male who presents today for a virtual office visit.  Assessment/Plan:  New/Acute Problems: Sinusitis No red flags.  Will send in refill on Atrovent.  Also start prednisone burst.  Will send in "pocket prescription" for prednisone with strict instruction not start unless symptoms do not improve over the next few days.  Encourage good oral hydration.  Discussed reasons return to care.  Chronic Problems Addressed Today: Crohn's disease ([place]) Doing well.  Patient would like to avoid antibiotics to avoid flaring up any GI issues.  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  Had steroid injection which seems to be helping.     Subjective:  HPI:  Patient here with upper respiratory symptoms.  This started about a week ago.  Associated with malaise, fatigue, rhinorrhea, and cough.  He has had some mucus production.  He has tried ibuprofen and Atrovent with modest improvement.  Children have been sick as well.  No fevers or chills.  No shortness of breath.  Has had some sinus pressure.       Objective/Observations  Physical Exam: Gen: NAD, resting comfortably Pulm: Normal work of breathing Neuro: Grossly normal, moves all extremities Psych: Normal affect and thought content  Virtual Visit via Video   I connected with Charles [name] on 02/24/20 at  4:00 PM EDT by a video enabled telemedicine application and verified that I am speaking with the correct person using two identifiers. The limitations of evaluation and management by telemedicine and the availability of in person appointments were discussed. The patient expressed understanding and agreed to proceed.   Patient location: Home Provider location: [place] participating in the virtual visit: Myself and Patient     [name]. [name], MD 02/24/2020 12:09 PM

## 2020-02-24 NOTE — Assessment & Plan Note (Signed)
Doing well.  Patient would like to avoid antibiotics to avoid flaring up any GI issues.

## 2020-03-13 ENCOUNTER — Ambulatory Visit: Payer: 59 | Admitting: Family Medicine

## 2020-04-07 ENCOUNTER — Other Ambulatory Visit: Payer: Self-pay

## 2020-04-07 ENCOUNTER — Ambulatory Visit (INDEPENDENT_AMBULATORY_CARE_PROVIDER_SITE_OTHER): Payer: 59 | Admitting: Family Medicine

## 2020-04-07 ENCOUNTER — Encounter: Payer: Self-pay | Admitting: Family Medicine

## 2020-04-07 VITALS — BP 96/80 | HR 84 | Ht 70.0 in | Wt 151.0 lb

## 2020-04-07 DIAGNOSIS — M999 Biomechanical lesion, unspecified: Secondary | ICD-10-CM | POA: Diagnosis not present

## 2020-04-07 DIAGNOSIS — M461 Sacroiliitis, not elsewhere classified: Secondary | ICD-10-CM | POA: Diagnosis not present

## 2020-04-07 NOTE — Patient Instructions (Addendum)
Good to see you See me again in 6-8 weeks 

## 2020-04-07 NOTE — Progress Notes (Signed)
[place] [address] [place] Phone: [phone] Subjective:   I Charles [name] am serving as a Education administrator for Dr. Hulan Saas.  This visit occurred during the SARS-CoV-2 public health emergency.  Safety protocols were in place, including screening questions prior to the visit, additional usage of staff PPE, and extensive cleaning of exam room while observing appropriate contact time as indicated for disinfecting solutions.   I'm seeing this patient by the request  of:  Vivi Barrack, MD  CC: Back pain follow-up  [id number]  Charles [name] is a 49 y.o. male coming in with complaint of back and neck pain. OMT 01/18/2020. Patient states he believes he may need to stop sleeping on his side. Overall doing well.  Mild tightness here there, working out on a regular basis and doing more running at the moment.  Was swimming initially but has not had as much time for that recently.  Medications patient has been prescribed: Intermittently ibuprofen but usually tries to avoid it         Reviewed prior external information including notes and imaging from previsou exam, outside providers and external EMR if available.   As well as notes that were available from care everywhere and other healthcare systems.  Past medical history, social, surgical and family history all reviewed in electronic medical record.  No pertanent information unless stated regarding to the chief complaint.   Past Medical History:  Diagnosis Date  . Allergy   . Arthritis   . Arthropathy    and scroilitis  . Asthma   . Blood transfusion without reported diagnosis   . 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 Crohn's ileocolitis ([place]) 08/29/2008  . Dysplastic nevi   . GERD (gastroesophageal reflux disease)   . Herniated disc   . History of proctitis 2006  . IBD (inflammatory bowel disease)   . Incisional hernia   . Migraine headache   . Morton's  neuroma of right foot   . Osteopenia   . Pouchitis ([place]) 2006  . Small bowel obstruction due to adhesions ([place]) 02/09/2015  . Thyroid disease   . Vitamin D deficiency     Allergies  Allergen Reactions  . Mercaptopurine Nausea And Vomiting    Felt ill within 2 days of initiating therapy.     Review of Systems:  No headache, visual changes, nausea, vomiting, diarrhea, constipation, dizziness, abdominal pain, skin rash, fevers, chills, night sweats, weight loss, swollen lymph nodes, , joint swelling, chest pain, shortness of breath, mood changes. POSITIVE muscle aches, body aches  Objective  Blood pressure 96/80, pulse 84, height 5' 10"  (1.778 m), weight 151 lb (68.5 kg), SpO2 98 %.   General: No apparent distress alert and oriented x3 mood and affect normal, dressed appropriately.  HEENT: Pupils equal, extraocular movements intact  Respiratory: Patient's speak in full sentences and does not appear short of breath  Cardiovascular: No lower extremity edema, non tender, no erythema  Neuro: Cranial nerves II through XII are intact, neurovascularly intact in all extremities with 2+ DTRs and 2+ pulses.  Gait normal with good balance and coordination.  MSK:  Non tender with full range of motion and good stability and symmetric strength and tone of shoulders, elbows, wrist, hip, knee and ankles bilaterally.  Back -back exam does have some mild loss of lordosis, tightness around the sacroiliac joint right greater than left.  Mild tightness of the back as well  Osteopathic findings  C2 flexed rotated and side bent right C7 flexed rotated and side bent left  T9 extended rotated and side bent left L2 flexed rotated and side bent right Sacrum right on right       Assessment and Plan:    Nonallopathic problems  Decision today to treat with OMT was based on Physical Exam  After verbal consent patient was treated with HVLA, ME, FPR techniques in cervical, rib, thoracic, lumbar, and  sacral  areas  Patient tolerated the procedure well with improvement in symptoms  Patient given exercises, stretches and lifestyle modifications  See medications in patient instructions if given  Patient will follow up in 4-8 weeks      The above documentation has been reviewed and is accurate and complete Lyndal Pulley, DO       Note: This dictation was prepared with Dragon dictation along with smaller phrase technology. Any transcriptional errors that result from this process are unintentional.

## 2020-04-07 NOTE — Assessment & Plan Note (Signed)
Has had chronic problems with this previously but seems to be stable at this moment.  We discussed that with patient having struggled with absorption we need to consider the possibility of vitamin D supplementation as well as iron supplementation the patient has had trouble with.  Patient will follow up with me again in 6 to 8 weeks and likely will check labs as well.  Responding well to manipulation.

## 2020-04-12 ENCOUNTER — Other Ambulatory Visit: Payer: Self-pay | Admitting: Internal Medicine

## 2020-04-12 NOTE — Telephone Encounter (Signed)
Please advise Sir? Thank you. 

## 2020-04-24 ENCOUNTER — Encounter: Payer: Self-pay | Admitting: Family Medicine

## 2020-04-26 ENCOUNTER — Ambulatory Visit (INDEPENDENT_AMBULATORY_CARE_PROVIDER_SITE_OTHER): Payer: 59 | Admitting: Family Medicine

## 2020-04-26 ENCOUNTER — Other Ambulatory Visit: Payer: Self-pay

## 2020-04-26 ENCOUNTER — Encounter: Payer: Self-pay | Admitting: Family Medicine

## 2020-04-26 ENCOUNTER — Ambulatory Visit: Payer: Self-pay

## 2020-04-26 VITALS — BP 104/80 | HR 72 | Ht 70.0 in | Wt 151.0 lb

## 2020-04-26 DIAGNOSIS — G8929 Other chronic pain: Secondary | ICD-10-CM

## 2020-04-26 DIAGNOSIS — M25561 Pain in right knee: Secondary | ICD-10-CM | POA: Diagnosis not present

## 2020-04-26 DIAGNOSIS — M659 Synovitis and tenosynovitis, unspecified: Secondary | ICD-10-CM | POA: Diagnosis not present

## 2020-04-26 NOTE — Assessment & Plan Note (Signed)
Synovitis noted, discussed icing regimen and home exercise, discussed which activities to do which wants to avoid.  Increase activity slowly.  Patient does have the underlying Crohn's disease and this could be potentially part of a flare.  Will avoid on prednisone and given injection locally, triple light given with patient continuing to do hiking of the [place], follow-up again in 6 weeks

## 2020-04-26 NOTE — Progress Notes (Signed)
[place] [address] [place] Phone: [phone] Subjective:   I Charles [name] am serving as a Education administrator for Dr. Hulan Saas.  This visit occurred during the SARS-CoV-2 public health emergency.  Safety protocols were in place, including screening questions prior to the visit, additional usage of staff PPE, and extensive cleaning of exam room while observing appropriate contact time as indicated for disinfecting solutions.   I'm seeing this patient by the request  of:  Charles Barrack, MD  CC: Right knee pain   [id number]  Charles [name] is a 49 y.o. male coming in with complaint right knee pain. Last seen 04/07/2020 for OMT. States the knee is swollen and has lost some ROM. States he went hiking and most of the hike was down hill.    Onset- 1 week  Location - Patellar  Duration-  Character- Aggravating factors- Standing, squatting  Reliving factors-  Therapies tried- ice, heat, topical, oral, compression sleeve  Severity- 5/10 at its worse      Past Medical History:  Diagnosis Date  . Allergy   . Arthritis   . Arthropathy    and scroilitis  . Asthma   . Blood transfusion without reported diagnosis   . 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 Crohn's ileocolitis ([place]) 08/29/2008  . Dysplastic nevi   . GERD (gastroesophageal reflux disease)   . Herniated disc   . History of proctitis 2006  . IBD (inflammatory bowel disease)   . Incisional hernia   . Migraine headache   . Morton's neuroma of right foot   . Osteopenia   . Pouchitis ([place]) 2006  . Small bowel obstruction due to adhesions ([place]) 02/09/2015  . Thyroid disease   . Vitamin D deficiency    Past Surgical History:  Procedure Laterality Date  . COLECTOMY    . COLONOSCOPY W/ BIOPSIES  04/16/2006   crohn's colitis  . FLEXIBLE SIGMOIDOSCOPY  2006 - 02/2014   ileocolitis, pouchitis.  with pouch ulcer.   . FUNCTIONAL ENDOSCOPIC SINUS SURGERY   12/2003   partial ethmoidectomy.  Dr Wilburn Cornelia  . ileoanal pull-through    . LUMBAR LAMINECTOMY/DECOMPRESSION MICRODISCECTOMY Right 10/13/2015   Procedure: Right Lumbar four- five Microdiskectomy;  Surgeon: Erline Levine, MD;  Location: [facility];  Service: Neurosurgery;  Laterality: Right;  Right L4-5 Microdiskectomy  . sacroilitis    . SIGMOIDOSCOPY  01/2005  . VENTRAL HERNIA REPAIR  06/2014   Social History   Socioeconomic History  . Marital status: Divorced    Spouse name: Not on file  . Number of children: 2  . Years of education: 16 at least  . Highest education level: Not on file  Occupational History  . Occupation: Surveyor, quantity: [employer]  Tobacco Use  . Smoking status: Never Smoker  . Smokeless tobacco: Never Used  Vaping Use  . Vaping Use: Never used  Substance and Sexual Activity  . Alcohol use: Yes    Alcohol/week: 14.0 standard drinks    Types: 14 Glasses of wine per week    Comment: couple of drinks daily  . Drug use: No  . Sexual activity: Yes  Other Topics Concern  . Not on file  Social History Narrative   Father a pulmonologist in [place].   Married to Dr. Honor Junes daughter Gerald Stabs - separated 2017   Wife is CRNA   2 sons   + EtOH   No tobacco  No drugs   Social Determinants of Health   Financial Resource Strain:   . Difficulty of Paying Living Expenses: Not on file  Food Insecurity:   . Worried About Charity fundraiser in the Last Year: Not on file  . Ran Out of Food in the Last Year: Not on file  Transportation Needs:   . Lack of Transportation (Medical): Not on file  . Lack of Transportation (Non-Medical): Not on file  Physical Activity:   . Days of Exercise per Week: Not on file  . Minutes of Exercise per Session: Not on file  Stress:   . Feeling of Stress : Not on file  Social Connections:   . Frequency of Communication with Friends and Family: Not on file  . Frequency of Social Gatherings with Friends and  Family: Not on file  . Attends Religious Services: Not on file  . Active Member of Clubs or Organizations: Not on file  . Attends Archivist Meetings: Not on file  . Marital Status: Not on file   Allergies  Allergen Reactions  . Mercaptopurine Nausea And Vomiting    Felt ill within 2 days of initiating therapy.   Family History  Problem Relation Age of Onset  . Ulcerative colitis Mother   . Colon cancer Paternal Grandmother   . Brain cancer Paternal Grandmother        mets from colon    Current Outpatient Medications (Endocrine & Metabolic):  .  predniSONE (DELTASONE) 50 MG tablet, Take 1 tablet daily for 5 days.   Current Outpatient Medications (Respiratory):  .  azelastine (ASTELIN) 0.1 % nasal spray, Place 2 sprays into both nostrils 2 (two) times daily. .  budesonide-formoterol (SYMBICORT) 160-4.5 MCG/ACT inhaler, INHALE 1 PUFF INTO THE LUNGS TWICE DAILY  Current Outpatient Medications (Analgesics):  .  celecoxib (CELEBREX) 100 MG capsule, TAKE 1 CAPSULE BY MOUTH 2 TIMES DAILY AS NEEDED .  HUMIRA PEN 40 MG/0.8ML PNKT, INJECT ONE PEN SUBCUTANEOUSLY EVERY OTHER WEEK. REFRIGERATE.   Current Outpatient Medications (Other):  Marland Kitchen  AMBULATORY NON FORMULARY MEDICATION, Inject into the vein. Iron Infusion  monthly .  azithromycin (ZITHROMAX) 250 MG tablet, Take 2 tabs day 1, then 1 tab daily .  cholecalciferol (VITAMIN D) 1000 units tablet, Take 1,000 Units by mouth daily. Marland Kitchen  desonide (DESOWEN) 0.05 % ointment, Apply 1 application topically 2 (two) times daily. .  lansoprazole (PREVACID) 30 MG capsule, Take 1 capsule (30 mg total) by mouth 2 (two) times daily.   Reviewed prior external information including notes and imaging from  primary care provider As well as notes that were available from care everywhere and other healthcare systems.  Past medical history, social, surgical and family history all reviewed in electronic medical record.  No pertanent information  unless stated regarding to the chief complaint.   Review of Systems:  No headache, visual changes, nausea, vomiting, diarrhea, constipation, dizziness, abdominal pain, skin rash, fevers, chills, night sweats, weight loss, swollen lymph nodes, body aches, joint swelling, chest pain, shortness of breath, mood changes. POSITIVE muscle aches  Objective  Blood pressure 104/80, pulse 72, height 5' 10"  (1.778 m), weight 151 lb (68.5 kg), SpO2 97 %.   General: No apparent distress alert and oriented x3 mood and affect normal, dressed appropriately.  HEENT: Pupils equal, extraocular movements intact  Respiratory: Patient's speak in full sentences and does not appear short of breath  Cardiovascular: No lower extremity edema, non tender, no erythema  Neuro: Cranial nerves II  through XII are intact, neurovascularly intact in all extremities with 2+ DTRs and 2+ pulses.  Gait normal with good balance and coordination.  MSK:  \ Right knee exam shows the patient does have an effusion noted.  The patient does have some mild patellofemoral lateral tracking noted.  Patient does like the last 10 degrees of flexion.  Limited musculoskeletal ultrasound was performed and interpreted by Lyndal Pulley  Limited ultrasound of patient's right knee shows a fairly large effusion of the patellofemoral space as well as what appears to be synovitis noted with inflammation.  Patient does not have much but some mild arthritic changes and narrowing of the patellofemoral joint.  Medial and lateral meniscus appear to be intact with no significant findings.  After informed written and verbal consent, patient was seated on exam table. Right knee was prepped with alcohol swab and utilizing anterolateral approach, patient's right knee space was injected with 4:1  marcaine 0.5%: Kenalog 39m/dL. Patient tolerated the procedure well without immediate complications.    Impression and Recommendations:     The above documentation has  been reviewed and is accurate and complete ZLyndal Pulley DO       Note: This dictation was prepared with Dragon dictation along with smaller phrase technology. Any transcriptional errors that result from this process are unintentional.

## 2020-04-26 NOTE — Patient Instructions (Addendum)
Good to see you PF exercises See me again in 6 weeks

## 2020-05-08 ENCOUNTER — Encounter: Payer: Self-pay | Admitting: Family Medicine

## 2020-05-15 ENCOUNTER — Encounter: Payer: Self-pay | Admitting: Family Medicine

## 2020-05-15 NOTE — Progress Notes (Signed)
[facility] [address] [place] Phone: [phone] Subjective:   I Charles [name] am serving as a Education administrator for Dr. Hulan Saas.  This visit occurred during the SARS-CoV-2 public health emergency.  Safety protocols were in place, including screening questions prior to the visit, additional usage of staff PPE, and extensive cleaning of exam room while observing appropriate contact time as indicated for disinfecting solutions.   I'm seeing this patient by the request  of:  Vivi Barrack, MD  CC: Knee pain follow-up  [id number]   04/26/2020 Synovitis noted, discussed icing regimen and home exercise, discussed which activities to do which wants to avoid.  Increase activity slowly.  Patient does have the underlying Crohn's disease and this could be potentially part of a flare.  Will avoid on prednisone and given injection locally, triple light given with patient continuing to do hiking of the [place], follow-up again in 6 weeks  Update 05/15/2020 Charles [name] is a 49 y.o. male coming in with complaint of right knee pain. Patient states that his knee swelling is increasing. States he believes the knee is worse. States that it was good after the injection but started swelling shortly after that.  Patient states that he is not having any pain just some decreased range of motion.  Patient has been able to walk as well as even running a little bit with no significant pain      Past Medical History:  Diagnosis Date  . Allergy   . Arthritis   . Arthropathy    and scroilitis  . Asthma   . Blood transfusion without reported diagnosis   . 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 Crohn's ileocolitis ([place]) 08/29/2008  . Dysplastic nevi   . GERD (gastroesophageal reflux disease)   . Herniated disc   . History of proctitis 2006  . IBD (inflammatory bowel disease)   . Incisional hernia   . Migraine headache   . Morton's  neuroma of right foot   . Osteopenia   . Pouchitis ([place]) 2006  . Small bowel obstruction due to adhesions ([place]) 02/09/2015  . Thyroid disease   . Vitamin D deficiency    Past Surgical History:  Procedure Laterality Date  . COLECTOMY    . COLONOSCOPY W/ BIOPSIES  04/16/2006   crohn's colitis  . FLEXIBLE SIGMOIDOSCOPY  2006 - 02/2014   ileocolitis, pouchitis.  with pouch ulcer.   . FUNCTIONAL ENDOSCOPIC SINUS SURGERY  12/2003   partial ethmoidectomy.  Dr Wilburn Cornelia  . ileoanal pull-through    . LUMBAR LAMINECTOMY/DECOMPRESSION MICRODISCECTOMY Right 10/13/2015   Procedure: Right Lumbar four- five Microdiskectomy;  Surgeon: Erline Levine, MD;  Location: [facility];  Service: Neurosurgery;  Laterality: Right;  Right L4-5 Microdiskectomy  . sacroilitis    . SIGMOIDOSCOPY  01/2005  . VENTRAL HERNIA REPAIR  06/2014   Social History   Socioeconomic History  . Marital status: Divorced    Spouse name: Not on file  . Number of children: 2  . Years of education: 16 at least  . Highest education level: Not on file  Occupational History  . Occupation: Surveyor, quantity: [employer]  Tobacco Use  . Smoking status: Never Smoker  . Smokeless tobacco: Never Used  Vaping Use  . Vaping Use: Never used  Substance and Sexual Activity  . Alcohol use: Yes    Alcohol/week: 14.0 standard drinks    Types: 14 Glasses  of wine per week    Comment: couple of drinks daily  . Drug use: No  . Sexual activity: Yes  Other Topics Concern  . Not on file  Social History Narrative   Father a pulmonologist in [place].   Married to Dr. Honor Junes daughter Gerald Stabs - separated 2017   Wife is CRNA   2 sons   + EtOH   No tobacco   No drugs   Social Determinants of Health   Financial Resource Strain:   . Difficulty of Paying Living Expenses: Not on file  Food Insecurity:   . Worried About Charity fundraiser in the Last Year: Not on file  . Ran Out of Food in the Last Year: Not on file    Transportation Needs:   . Lack of Transportation (Medical): Not on file  . Lack of Transportation (Non-Medical): Not on file  Physical Activity:   . Days of Exercise per Week: Not on file  . Minutes of Exercise per Session: Not on file  Stress:   . Feeling of Stress : Not on file  Social Connections:   . Frequency of Communication with Friends and Family: Not on file  . Frequency of Social Gatherings with Friends and Family: Not on file  . Attends Religious Services: Not on file  . Active Member of Clubs or Organizations: Not on file  . Attends Archivist Meetings: Not on file  . Marital Status: Not on file   Allergies  Allergen Reactions  . Mercaptopurine Nausea And Vomiting    Felt ill within 2 days of initiating therapy.   Family History  Problem Relation Age of Onset  . Ulcerative colitis Mother   . Colon cancer Paternal Grandmother   . Brain cancer Paternal Grandmother        mets from colon    Current Outpatient Medications (Endocrine & Metabolic):  .  predniSONE (DELTASONE) 50 MG tablet, Take 1 tablet daily for 5 days. .  predniSONE (DELTASONE) 20 MG tablet, Take 1 tablet (20 mg total) by mouth 2 (two) times daily.   Current Outpatient Medications (Respiratory):  .  azelastine (ASTELIN) 0.1 % nasal spray, Place 2 sprays into both nostrils 2 (two) times daily. .  budesonide-formoterol (SYMBICORT) 160-4.5 MCG/ACT inhaler, INHALE 1 PUFF INTO THE LUNGS TWICE DAILY  Current Outpatient Medications (Analgesics):  .  celecoxib (CELEBREX) 100 MG capsule, TAKE 1 CAPSULE BY MOUTH 2 TIMES DAILY AS NEEDED .  HUMIRA PEN 40 MG/0.8ML PNKT, INJECT ONE PEN SUBCUTANEOUSLY EVERY OTHER WEEK. REFRIGERATE.   Current Outpatient Medications (Other):  Marland Kitchen  AMBULATORY NON FORMULARY MEDICATION, Inject into the vein. Iron Infusion  monthly .  azithromycin (ZITHROMAX) 250 MG tablet, Take 2 tabs day 1, then 1 tab daily .  cholecalciferol (VITAMIN D) 1000 units tablet, Take 1,000 Units  by mouth daily. Marland Kitchen  desonide (DESOWEN) 0.05 % ointment, Apply 1 application topically 2 (two) times daily. .  lansoprazole (PREVACID) 30 MG capsule, Take 1 capsule (30 mg total) by mouth 2 (two) times daily.   Reviewed prior external information including notes and imaging from  primary care provider As well as notes that were available from care everywhere and other healthcare systems.  Past medical history, social, surgical and family history all reviewed in electronic medical record.  No pertanent information unless stated regarding to the chief complaint.   Review of Systems:  No headache, visual changes, nausea, vomiting, diarrhea, constipation, dizziness, abdominal pain, skin rash, fevers, chills, night sweats,  weight loss, swollen lymph nodes, body aches, joint swelling, chest pain, shortness of breath, mood changes. POSITIVE muscle aches  Objective  Blood pressure 120/84, pulse 87, height 5' 10"  (1.778 m), weight 152 lb (68.9 kg), SpO2 99 %.   General: No apparent distress alert and oriented x3 mood and affect normal, dressed appropriately.  HEENT: Pupils equal, extraocular movements intact  Respiratory: Patient's speak in full sentences and does not appear short of breath  Cardiovascular: No lower extremity edema, non tender, no erythema  Neuro: Cranial nerves II through XII are intact, neurovascularly intact in all extremities with 2+ DTRs and 2+ pulses.  Gait normal with good balance and coordination.  MSK: Right knee does have swelling compared to the left knee.  Lacks last 5 degrees of extension of the knee in the last 10 degrees of flexion compared to the contralateral side.  With no significant instability noted.    Impression and Recommendations:     The above documentation has been reviewed and is accurate and complete Lyndal Pulley, DO       Note: This dictation was prepared with Dragon dictation along with smaller phrase technology. Any transcriptional errors  that result from this process are unintentional.

## 2020-05-16 ENCOUNTER — Ambulatory Visit (INDEPENDENT_AMBULATORY_CARE_PROVIDER_SITE_OTHER): Payer: 59 | Admitting: Family Medicine

## 2020-05-16 ENCOUNTER — Encounter: Payer: Self-pay | Admitting: Family Medicine

## 2020-05-16 ENCOUNTER — Other Ambulatory Visit: Payer: Self-pay

## 2020-05-16 DIAGNOSIS — M659 Synovitis and tenosynovitis, unspecified: Secondary | ICD-10-CM

## 2020-05-16 MED ORDER — PREDNISONE 20 MG PO TABS
20.0000 mg | ORAL_TABLET | Freq: Two times a day (BID) | ORAL | 0 refills | Status: DC
Start: 2020-05-16 — End: 2020-07-21

## 2020-05-16 NOTE — Assessment & Plan Note (Signed)
Patient responded well to the injection previously.  We discussed with patient that I do think that this is more of a flare of the Crohn's disease.  We discussed with patient about the Celebrex, topical anti-inflammatories, too soon to do another steroid injection.  Patient has had more the cutaneous sarcoidosis as well it could be also contributing.  Discussed potential MRI but patient is not having pain and only the lack of range of motion.  Follow-up with me again in 3 to 4 weeks and can repeat injection at that time.

## 2020-05-16 NOTE — Patient Instructions (Addendum)
Good to see you Wait at least 4 weeks for injection Pennsaid for the knee If not better by Friday prednisone 20 mg daily for 5 days See me again in 4 weeks

## 2020-05-30 ENCOUNTER — Ambulatory Visit: Payer: 59 | Admitting: Family Medicine

## 2020-06-12 NOTE — Progress Notes (Signed)
[facility] [place] Phone: [phone] Subjective:   Charles [name], am serving as a scribe for Dr. Hulan Saas. This visit occurred during the SARS-CoV-2 public health emergency.  Safety protocols were in place, including screening questions prior to the visit, additional usage of staff PPE, and extensive cleaning of exam room while observing appropriate contact time as indicated for disinfecting solutions.   I'm seeing this patient by the request  of:  Vivi Barrack, MD  CC: Knee pain follow-up, back pain  [id number]   05/16/2020 Patient responded well to the injection previously.  We discussed with patient that I do think that this is more of a flare of the Crohn's disease.  We discussed with patient about the Celebrex, topical anti-inflammatories, too soon to do another steroid injection.  Patient has had more the cutaneous sarcoidosis as well it could be also contributing.  Discussed potential MRI but patient is not having pain and only the lack of range of motion.  Follow-up with me again in 3 to 4 weeks and can repeat injection at that time.  Update 06/13/2020 Charles [name] is a 49 y.o. male coming in with complaint of right knee pain. Patient states that he has been running. Did a 10 mile run this weekend and 5 miles today without pain. States that knee does still feel "different" than contralateral side but not painful. Is going to do a half marathon in 2 weeks and would like to be sure that knees looks like it has healed.      Past Medical History:  Diagnosis Date  . Allergy   . Arthritis   . Arthropathy    and scroilitis  . Asthma   . Blood transfusion without reported diagnosis   . 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 Crohn's ileocolitis ([place]) 08/29/2008  . Dysplastic nevi   . GERD (gastroesophageal reflux disease)   . Herniated disc   . History of proctitis 2006  . IBD  (inflammatory bowel disease)   . Incisional hernia   . Migraine headache   . Morton's neuroma of right foot   . Osteopenia   . Pouchitis ([place]) 2006  . Small bowel obstruction due to adhesions ([place]) 02/09/2015  . Thyroid disease   . Vitamin D deficiency    Past Surgical History:  Procedure Laterality Date  . COLECTOMY    . COLONOSCOPY W/ BIOPSIES  04/16/2006   crohn's colitis  . FLEXIBLE SIGMOIDOSCOPY  2006 - 02/2014   ileocolitis, pouchitis.  with pouch ulcer.   . FUNCTIONAL ENDOSCOPIC SINUS SURGERY  12/2003   partial ethmoidectomy.  Dr Wilburn Cornelia  . ileoanal pull-through    . LUMBAR LAMINECTOMY/DECOMPRESSION MICRODISCECTOMY Right 10/13/2015   Procedure: Right Lumbar four- five Microdiskectomy;  Surgeon: Erline Levine, MD;  Location: [facility];  Service: Neurosurgery;  Laterality: Right;  Right L4-5 Microdiskectomy  . sacroilitis    . SIGMOIDOSCOPY  01/2005  . VENTRAL HERNIA REPAIR  06/2014   Social History   Socioeconomic History  . Marital status: Divorced    Spouse name: Not on file  . Number of children: 2  . Years of education: 16 at least  . Highest education level: Not on file  Occupational History  . Occupation: Surveyor, quantity: [employer]  Tobacco Use  . Smoking status: Never Smoker  . Smokeless tobacco: Never Used  Vaping Use  . Vaping Use: Never used  Substance and Sexual Activity  . Alcohol use: Yes    Alcohol/week: 14.0 standard drinks    Types: 14 Glasses of wine per week    Comment: couple of drinks daily  . Drug use: [name]  . Sexual activity: Yes  Other Topics Concern  . Not on file  Social History Narrative   Father a pulmonologist in [place].   Married to Dr. Honor Junes daughter Gerald Stabs - separated 2017   Wife is CRNA   2 sons   + EtOH   [name] tobacco   [name] drugs   Social Determinants of Health   Financial Resource Strain:   . Difficulty of Paying Living Expenses: Not on file  Food Insecurity:   . Worried About Sales executive in the Last Year: Not on file  . Ran Out of Food in the Last Year: Not on file  Transportation Needs:   . Lack of Transportation (Medical): Not on file  . Lack of Transportation (Non-Medical): Not on file  Physical Activity:   . Days of Exercise per Week: Not on file  . Minutes of Exercise per Session: Not on file  Stress:   . Feeling of Stress : Not on file  Social Connections:   . Frequency of Communication with Friends and Family: Not on file  . Frequency of Social Gatherings with Friends and Family: Not on file  . Attends Religious Services: Not on file  . Active Member of Clubs or Organizations: Not on file  . Attends Archivist Meetings: Not on file  . Marital Status: Not on file   Allergies  Allergen Reactions  . Mercaptopurine Nausea And Vomiting    Felt ill within 2 days of initiating therapy.   Family History  Problem Relation Age of Onset  . Ulcerative colitis Mother   . Colon cancer Paternal Grandmother   . Brain cancer Paternal Grandmother        mets from colon    Current Outpatient Medications (Endocrine & Metabolic):  .  predniSONE (DELTASONE) 20 MG tablet, Take 1 tablet (20 mg total) by mouth 2 (two) times daily. .  predniSONE (DELTASONE) 50 MG tablet, Take 1 tablet daily for 5 days.   Current Outpatient Medications (Respiratory):  .  azelastine (ASTELIN) 0.1 % nasal spray, Place 2 sprays into both nostrils 2 (two) times daily. .  budesonide-formoterol (SYMBICORT) 160-4.5 MCG/ACT inhaler, INHALE 1 PUFF INTO THE LUNGS TWICE DAILY .  montelukast (SINGULAIR) 10 MG tablet, Take 1 tablet (10 mg total) by mouth at bedtime.  Current Outpatient Medications (Analgesics):  .  celecoxib (CELEBREX) 100 MG capsule, TAKE 1 CAPSULE BY MOUTH 2 TIMES DAILY AS NEEDED .  HUMIRA PEN 40 MG/0.8ML PNKT, INJECT ONE PEN SUBCUTANEOUSLY EVERY OTHER WEEK. REFRIGERATE.   Current Outpatient Medications (Other):  Marland Kitchen  AMBULATORY NON FORMULARY MEDICATION, Inject into the  vein. Iron Infusion  monthly .  azithromycin (ZITHROMAX) 250 MG tablet, Take 2 tabs day 1, then 1 tab daily .  cholecalciferol (VITAMIN D) 1000 units tablet, Take 1,000 Units by mouth daily. Marland Kitchen  desonide (DESOWEN) 0.05 % ointment, Apply 1 application topically 2 (two) times daily. .  lansoprazole (PREVACID) 30 MG capsule, Take 1 capsule (30 mg total) by mouth 2 (two) times daily.   Reviewed prior external information including notes and imaging from  primary care provider As well as notes that were available from care everywhere and other healthcare systems.  Past medical history, social, surgical and family history all reviewed  in electronic medical record.  [name] pertanent information unless stated regarding to the chief complaint.   Review of Systems:  [name]  visual changes, nausea, vomiting, diarrhea, constipation, dizziness, abdominal pain, skin rash, fevers, chills, night sweats, weight loss, swollen lymph nodes, body aches, joint swelling, chest pain, shortness of breath, mood changes. POSITIVE muscle aches having some headaches secondary to his allergies he states.  Objective  Blood pressure 124/82, pulse 68, height 5' 10"  (1.778 m), weight 153 lb (69.4 kg), SpO2 98 %.   General: [name] apparent distress alert and oriented x3 mood and affect normal, dressed appropriately.  HEENT: Pupils equal, extraocular movements intact  Respiratory: Patient's speak in full sentences and does not appear short of breath  Cardiovascular: [name] lower extremity edema, non tender, [name] erythema  Neuro: Cranial nerves II through XII are intact, neurovascularly intact in all extremities with 2+ DTRs and 2+ pulses.  Gait normal with good balance and coordination.  MSK: Right knee still has a trace effusion noted but full range of motion.  Significant improvement.  [name] significant instability noted.  Contralateral knee also has a trace effusion noted.  Low back pain noted in the sacroiliac joints bilaterally.  Mild  tightness noted.  5 out of 5 strength of the lower extremities  Limited musculoskeletal ultrasound was performed and interpreted by Lyndal Pulley  Limited ultrasound shows the patient does still have a very mild effusion of the knee noted but significantly improved from previous exam.  [name] significant synovitis noted at this time.  Mild degenerative changes noted of the meniscus [name] displacement. Impression: Improvement of synovitis of the knee   Osteopathic findings  C4 flexed rotated and side bent leftt T3 extended rotated and side bent right inhaled third rib T8 extended rotated and side bent left L2 flexed rotated and side bent right Sacrum right on right    Impression and Recommendations:     The above documentation has been reviewed and is accurate and complete Lyndal Pulley, DO

## 2020-06-13 ENCOUNTER — Ambulatory Visit: Payer: Self-pay

## 2020-06-13 ENCOUNTER — Other Ambulatory Visit: Payer: Self-pay

## 2020-06-13 ENCOUNTER — Ambulatory Visit (INDEPENDENT_AMBULATORY_CARE_PROVIDER_SITE_OTHER): Payer: 59 | Admitting: Family Medicine

## 2020-06-13 ENCOUNTER — Encounter: Payer: Self-pay | Admitting: Family Medicine

## 2020-06-13 VITALS — BP 124/82 | HR 68 | Ht 70.0 in | Wt 153.0 lb

## 2020-06-13 DIAGNOSIS — J309 Allergic rhinitis, unspecified: Secondary | ICD-10-CM

## 2020-06-13 DIAGNOSIS — M659 Synovitis and tenosynovitis, unspecified: Secondary | ICD-10-CM

## 2020-06-13 DIAGNOSIS — M25561 Pain in right knee: Secondary | ICD-10-CM | POA: Diagnosis not present

## 2020-06-13 DIAGNOSIS — M5136 Other intervertebral disc degeneration, lumbar region: Secondary | ICD-10-CM

## 2020-06-13 DIAGNOSIS — M999 Biomechanical lesion, unspecified: Secondary | ICD-10-CM | POA: Diagnosis not present

## 2020-06-13 DIAGNOSIS — G8929 Other chronic pain: Secondary | ICD-10-CM | POA: Diagnosis not present

## 2020-06-13 MED ORDER — MONTELUKAST SODIUM 10 MG PO TABS: 10.0000 mg | ORAL_TABLET | Freq: Every day | ORAL | 3 refills | Status: DC

## 2020-06-13 MED ORDER — MONTELUKAST SODIUM 10 MG PO TABS
10.0000 mg | ORAL_TABLET | Freq: Every day | ORAL | 3 refills | Status: DC
Start: 2020-06-13 — End: 2020-06-13

## 2020-06-13 NOTE — Assessment & Plan Note (Signed)
Near completely resolved at this time.  Discussed icing regimen and home exercise, which activities to do which wants to avoid.  I do believe that the Crohn's disease could be contributing for this as well.  Increase activity slowly.  Has not Celebrex.  Encouraged icing.  Able to run and follow-up with me again in 6 weeks

## 2020-06-13 NOTE — Assessment & Plan Note (Signed)
History of back surgery.  Has attempted osteopathic manipulation.  Has responded well in the past.  Increase activity slowly.  Follow-up again 4 to 8 weeks

## 2020-06-13 NOTE — Assessment & Plan Note (Signed)

## 2020-06-13 NOTE — Assessment & Plan Note (Signed)
Started Singulair for a trial.  Warned of potential side effects including black box warning.  Patient will talk to primary care provider about continuation.

## 2020-06-13 NOTE — Patient Instructions (Signed)
Singulair  Good luck with the race!

## 2020-06-20 DIAGNOSIS — H9193 Unspecified hearing loss, bilateral: Secondary | ICD-10-CM | POA: Insufficient documentation

## 2020-06-20 DIAGNOSIS — J324 Chronic pansinusitis: Secondary | ICD-10-CM | POA: Insufficient documentation

## 2020-06-20 DIAGNOSIS — H9313 Tinnitus, bilateral: Secondary | ICD-10-CM | POA: Insufficient documentation

## 2020-06-26 ENCOUNTER — Other Ambulatory Visit: Payer: Self-pay | Admitting: Internal Medicine

## 2020-06-27 ENCOUNTER — Encounter: Payer: 59 | Admitting: Family Medicine

## 2020-07-10 ENCOUNTER — Encounter: Payer: Self-pay | Admitting: Family Medicine

## 2020-07-19 ENCOUNTER — Encounter: Payer: Self-pay | Admitting: Family Medicine

## 2020-07-19 NOTE — Telephone Encounter (Signed)
Please advise 

## 2020-07-19 NOTE — Telephone Encounter (Signed)
Spoke with patient. Stated will get Flu vaccine on PHY day and possible TDap due on 04/2021

## 2020-07-21 ENCOUNTER — Other Ambulatory Visit: Payer: Self-pay

## 2020-07-21 ENCOUNTER — Ambulatory Visit (INDEPENDENT_AMBULATORY_CARE_PROVIDER_SITE_OTHER): Payer: 59 | Admitting: Family Medicine

## 2020-07-21 ENCOUNTER — Encounter: Payer: Self-pay | Admitting: Family Medicine

## 2020-07-21 VITALS — BP 104/74 | HR 91 | Temp 98.1°F | Ht 70.0 in | Wt 149.8 lb

## 2020-07-21 DIAGNOSIS — Z0001 Encounter for general adult medical examination with abnormal findings: Secondary | ICD-10-CM

## 2020-07-21 DIAGNOSIS — Z23 Encounter for immunization: Secondary | ICD-10-CM | POA: Diagnosis not present

## 2020-07-21 DIAGNOSIS — E559 Vitamin D deficiency, unspecified: Secondary | ICD-10-CM | POA: Diagnosis not present

## 2020-07-21 DIAGNOSIS — K91850 Pouchitis: Secondary | ICD-10-CM | POA: Diagnosis not present

## 2020-07-21 DIAGNOSIS — M8580 Other specified disorders of bone density and structure, unspecified site: Secondary | ICD-10-CM

## 2020-07-21 DIAGNOSIS — K50118 Crohn's disease of large intestine with other complication: Secondary | ICD-10-CM | POA: Diagnosis not present

## 2020-07-21 DIAGNOSIS — Z111 Encounter for screening for respiratory tuberculosis: Secondary | ICD-10-CM

## 2020-07-21 NOTE — Assessment & Plan Note (Signed)
Overall doing well.  Managed by GI.

## 2020-07-21 NOTE — Progress Notes (Signed)
Chief Complaint:  Charles [name] is a 49 y.o. male who presents today for his annual comprehensive physical exam.    Assessment/Plan:  Chronic Problems Addressed Today: Crohn's disease ([place]) Overall doing well.  Managed by GI.   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   Vitamin D deficiency Check vitamin D.  Osteopenia Check bone density scan.   Preventative Healthcare: Flu and Tdap given today.  Will check labs including CBC, CMET, TSH  Patient Counseling(The following topics were reviewed and/or handout was given):  -Nutrition: Stressed importance of moderation in sodium/caffeine intake, saturated fat and cholesterol, caloric balance, sufficient intake of fresh fruits, vegetables, and fiber.  -Stressed the importance of regular exercise.   -Substance Abuse: Discussed cessation/primary prevention of tobacco, alcohol, or other drug use; driving or other dangerous activities under the influence; availability of treatment for abuse.   -Injury prevention: Discussed safety belts, safety helmets, smoke detector, smoking near bedding or upholstery.   -Sexuality: Discussed sexually transmitted diseases, partner selection, use of condoms, avoidance of unintended pregnancy and contraceptive alternatives.   -Dental health: Discussed importance of regular tooth brushing, flossing, and dental visits.  -Health maintenance and immunizations reviewed. Please refer to Health maintenance section.  Return to care in 1 year for next preventative visit.     Subjective:  HPI:  He has no acute complaints today. See A/p for statu  Lifestyle Diet: Limited due to crohns.  Exercise: likes running.   Depression screen PHQ 2/9 07/21/2020  Decreased Interest 0  Down, Depressed, Hopeless 0  PHQ - 2 Score 0  Some recent data might be hidden    Health Maintenance Due  Topic Date Due  . Hepatitis C Screening  Never done  . HIV Screening  Never done  . INFLUENZA VACCINE  03/19/2020      ROS: Per HPI, otherwise a complete review of systems was negative.   PMH:  The following were reviewed and entered/updated in epic: Past Medical History:  Diagnosis Date  . Allergy   . Arthritis   . Arthropathy    and scroilitis  . Asthma   . Blood transfusion without reported diagnosis   . 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 Crohn's ileocolitis ([place]) 08/29/2008  . Dysplastic nevi   . GERD (gastroesophageal reflux disease)   . Herniated disc   . History of proctitis 2006  . IBD (inflammatory bowel disease)   . Incisional hernia   . Migraine headache   . Morton's neuroma of right foot   . Osteopenia   . Pouchitis ([place]) 2006  . Small bowel obstruction due to adhesions ([place]) 02/09/2015  . Thyroid disease   . Vitamin D deficiency    Patient Active Problem List   Diagnosis Date Noted  . Degenerative disc disease, lumbar 06/13/2020  . Synovitis of right knee 04/26/2020  . 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 Allergic rhinitis 11/10/2018  . GERD (gastroesophageal reflux disease) 09/25/2018  . Anxiety state 07/05/2016  . 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 Vitamin D deficiency 08/31/2014  . Crohn's disease ([place]) 12/27/2013  . Airway hyperreactivity 06/11/2013  . Gilbert syndrome 06/19/2010  . EPIDERMOID CYST 02/15/2008  . Osteopenia 10/09/2007   Past Surgical History:  Procedure Laterality Date  . COLECTOMY    . COLONOSCOPY W/ BIOPSIES  04/16/2006   crohn's colitis  . FLEXIBLE SIGMOIDOSCOPY  2006 - 02/2014   ileocolitis, pouchitis.  with pouch ulcer.   . FUNCTIONAL ENDOSCOPIC SINUS SURGERY  12/2003   partial  ethmoidectomy.  Dr Wilburn Cornelia  . ileoanal pull-through    . LUMBAR LAMINECTOMY/DECOMPRESSION MICRODISCECTOMY Right 10/13/2015   Procedure: Right Lumbar four- five Microdiskectomy;  Surgeon: Erline Levine, MD;  Location: [facility];  Service: Neurosurgery;  Laterality: Right;  Right L4-5 Microdiskectomy  . sacroilitis    . SIGMOIDOSCOPY  01/2005   . VENTRAL HERNIA REPAIR  06/2014    Family History  Problem Relation Age of Onset  . Ulcerative colitis Mother   . Colon cancer Paternal Grandmother   . Brain cancer Paternal Grandmother        mets from colon    Medications- reviewed and updated Current Outpatient Medications  Medication Sig Dispense Refill  . AMBULATORY NON FORMULARY MEDICATION Inject into the vein. Iron Infusion  monthly    . budesonide-formoterol (SYMBICORT) 160-4.5 MCG/ACT inhaler INHALE 1 PUFF INTO THE LUNGS TWICE DAILY 3 Inhaler 1  . celecoxib (CELEBREX) 100 MG capsule TAKE 1 CAPSULE BY MOUTH 2 TIMES DAILY AS NEEDED 60 capsule 5  . cholecalciferol (VITAMIN D) 1000 units tablet Take 1,000 Units by mouth daily.    Marland Kitchen desonide (DESOWEN) 0.05 % ointment Apply 1 application topically 2 (two) times daily. 60 g 0  . HUMIRA PEN 40 MG/0.8ML PNKT INJECT ONE PEN SUBCUTANEOUSLY EVERY OTHER WEEK. REFRIGERATE. 1 each 6  . lansoprazole (PREVACID) 30 MG capsule Take 1 capsule (30 mg total) by mouth 2 (two) times daily. 180 capsule 2  . montelukast (SINGULAIR) 10 MG tablet Take 1 tablet (10 mg total) by mouth at bedtime. 30 tablet 3   No current facility-administered medications for this visit.    Allergies-reviewed and updated Allergies  Allergen Reactions  . Mercaptopurine Nausea And Vomiting    Felt ill within 2 days of initiating therapy.    Social History   Socioeconomic History  . Marital status: Divorced    Spouse name: Not on file  . Number of children: 2  . Years of education: 16 at least  . Highest education level: Not on file  Occupational History  . Occupation: Surveyor, quantity: [employer]  Tobacco Use  . Smoking status: Never Smoker  . Smokeless tobacco: Never Used  Vaping Use  . Vaping Use: Never used  Substance and Sexual Activity  . Alcohol use: Yes    Alcohol/week: 14.0 standard drinks    Types: 14 Glasses of wine per week    Comment: couple of drinks daily  . Drug  use: No  . Sexual activity: Yes  Other Topics Concern  . Not on file  Social History Narrative   Father a pulmonologist in [place].   Married to Dr. Honor Junes daughter Gerald Stabs - separated 2017   Wife is CRNA   2 sons   + EtOH   No tobacco   No drugs   Social Determinants of Health   Financial Resource Strain:   . Difficulty of Paying Living Expenses: Not on file  Food Insecurity:   . Worried About Charity fundraiser in the Last Year: Not on file  . Ran Out of Food in the Last Year: Not on file  Transportation Needs:   . Lack of Transportation (Medical): Not on file  . Lack of Transportation (Non-Medical): Not on file  Physical Activity:   . Days of Exercise per Week: Not on file  . Minutes of Exercise per Session: Not on file  Stress:   . Feeling of Stress : Not on file  Social Connections:   .  Frequency of Communication with Friends and Family: Not on file  . Frequency of Social Gatherings with Friends and Family: Not on file  . Attends Religious Services: Not on file  . Active Member of Clubs or Organizations: Not on file  . Attends Archivist Meetings: Not on file  . Marital Status: Not on file        Objective:  Physical Exam: BP 104/74   Pulse 91   Temp 98.1 F (36.7 C) (Temporal)   Ht 5' 10"  (1.778 m)   Wt 149 lb 12.8 oz (67.9 kg)   SpO2 99%   BMI 21.49 kg/m   Body mass index is 21.49 kg/m. Wt Readings from Last 3 Encounters:  07/21/20 149 lb 12.8 oz (67.9 kg)  06/13/20 153 lb (69.4 kg)  05/16/20 152 lb (68.9 kg)   Gen: NAD, resting comfortably HEENT: TMs normal bilaterally. OP clear. No thyromegaly noted.  CV: RRR with no murmurs appreciated Pulm: NWOB, CTAB with no crackles, wheezes, or rhonchi GI: Normal bowel sounds present. Soft, Nontender, Nondistended. MSK: no edema, cyanosis, or clubbing noted Skin: warm, dry Neuro: CN2-12 grossly intact. Strength 5/5 in upper and lower extremities. Reflexes symmetric and intact bilaterally.  Psych:  Normal affect and thought content     Nethan Caudillo M. [name], MD 07/21/2020 4:00 PM

## 2020-07-21 NOTE — Assessment & Plan Note (Signed)
Check bone density scan.

## 2020-07-21 NOTE — Assessment & Plan Note (Signed)
Check vitamin D. 

## 2020-07-21 NOTE — Patient Instructions (Signed)
It was very nice to see you today!  We will check blood work today.  I am glad that you are feeling well.  We give your flu vaccine and Tdap today.  I will see you back in year for your next annual checkup.  Please come back to see me sooner if needed.  Take care, Dr [name]  Please try these tips to maintain a healthy lifestyle:   Eat at least 3 REAL meals and 1-2 snacks per day.  Aim for no more than 5 hours between eating.  If you eat breakfast, please do so within one hour of getting up.    Each meal should contain half fruits/vegetables, one quarter protein, and one quarter carbs (no bigger than a computer mouse)   Cut down on sweet beverages. This includes juice, soda, and sweet tea.     Drink at least 1 glass of water with each meal and aim for at least 8 glasses per day   Exercise at least 150 minutes every week.     Preventive Care 82-54 Years Old, Male Preventive care refers to lifestyle choices and visits with your health care provider that can promote health and wellness. This includes:  A yearly physical exam. This is also called an annual well check.  Regular dental and eye exams.  Immunizations.  Screening for certain conditions.  Healthy lifestyle choices, such as eating a healthy diet, getting regular exercise, not using drugs or products that contain nicotine and tobacco, and limiting alcohol use. What can I expect for my preventive care visit? Physical exam Your health care provider will check:  Height and weight. These may be used to calculate body mass index (BMI), which is a measurement that tells if you are at a healthy weight.  Heart rate and blood pressure.  Your skin for abnormal spots. Counseling Your health care provider may ask you questions about:  Alcohol, tobacco, and drug use.  Emotional well-being.  Home and relationship well-being.  Sexual activity.  Eating habits.  Work and work Statistician. What immunizations do I  need?  Influenza (flu) vaccine  This is recommended every year. Tetanus, diphtheria, and pertussis (Tdap) vaccine  You may need a Td booster every 10 years. Varicella (chickenpox) vaccine  You may need this vaccine if you have not already been vaccinated. Zoster (shingles) vaccine  You may need this after age 25. Measles, mumps, and rubella (MMR) vaccine  You may need at least one dose of MMR if you were born in 1957 or later. You may also need a second dose. Pneumococcal conjugate (PCV13) vaccine  You may need this if you have certain conditions and were not previously vaccinated. Pneumococcal polysaccharide (PPSV23) vaccine  You may need one or two doses if you smoke cigarettes or if you have certain conditions. Meningococcal conjugate (MenACWY) vaccine  You may need this if you have certain conditions. Hepatitis A vaccine  You may need this if you have certain conditions or if you travel or work in places where you may be exposed to hepatitis A. Hepatitis B vaccine  You may need this if you have certain conditions or if you travel or work in places where you may be exposed to hepatitis B. Haemophilus influenzae type b (Hib) vaccine  You may need this if you have certain risk factors. Human papillomavirus (HPV) vaccine  If recommended by your health care provider, you may need three doses over 6 months. You may receive vaccines as individual doses or as  more than one vaccine together in one shot (combination vaccines). Talk with your health care provider about the risks and benefits of combination vaccines. What tests do I need? Blood tests  Lipid and cholesterol levels. These may be checked every 5 years, or more frequently if you are over 61 years old.  Hepatitis C test.  Hepatitis B test. Screening  Lung cancer screening. You may have this screening every year starting at age 67 if you have a 30-pack-year history of smoking and currently smoke or have quit within  the past 15 years.  Prostate cancer screening. Recommendations will vary depending on your family history and other risks.  Colorectal cancer screening. All adults should have this screening starting at age 43 and continuing until age 55. Your health care provider may recommend screening at age 80 if you are at increased risk. You will have tests every 1-10 years, depending on your results and the type of screening test.  Diabetes screening. This is done by checking your blood sugar (glucose) after you have not eaten for a while (fasting). You may have this done every 1-3 years.  Sexually transmitted disease (STD) testing. Follow these instructions at home: Eating and drinking  Eat a diet that includes fresh fruits and vegetables, whole grains, lean protein, and low-fat dairy products.  Take vitamin and mineral supplements as recommended by your health care provider.  Do not drink alcohol if your health care provider tells you not to drink.  If you drink alcohol: ? Limit how much you have to 0-2 drinks a day. ? Be aware of how much alcohol is in your drink. In the [place] one drink equals one 12 oz bottle of beer (355 mL), one 5 oz glass of wine (148 mL), or one 1 oz glass of hard liquor (44 mL). Lifestyle  Take daily care of your teeth and gums.  Stay active. Exercise for at least 30 minutes on 5 or more days each week.  Do not use any products that contain nicotine or tobacco, such as cigarettes, e-cigarettes, and chewing tobacco. If you need help quitting, ask your health care provider.  If you are sexually active, practice safe sex. Use a condom or other form of protection to prevent STIs (sexually transmitted infections).  Talk with your health care provider about taking a low-dose aspirin every day starting at age 54. What's next?  Go to your health care provider once a year for a well check visit.  Ask your health care provider how often you should have your eyes and teeth  checked.  Stay up to date on all vaccines. This information is not intended to replace advice given to you by your health care provider. Make sure you discuss any questions you have with your health care provider. Document Revised: 07/30/2018 Document Reviewed: 07/30/2018 Elsevier Patient Education  2020 [employer].

## 2020-07-21 NOTE — Assessment & Plan Note (Signed)
Doing well on humira.

## 2020-07-22 ENCOUNTER — Encounter: Payer: Self-pay | Admitting: Family Medicine

## 2020-07-23 LAB — TSH: TSH: 1.4 mIU/L (ref 0.40–4.50)

## 2020-07-23 LAB — COMPREHENSIVE METABOLIC PANEL
AG Ratio: 1.4 (calc) (ref 1.0–2.5)
ALT: 16 U/L (ref 9–46)
AST: 18 U/L (ref 10–40)
Albumin: 4.3 g/dL (ref 3.6–5.1)
Alkaline phosphatase (APISO): 50 U/L (ref 36–130)
BUN: 15 mg/dL (ref 7–25)
CO2: 28 mmol/L (ref 20–32)
Calcium: 9.9 mg/dL (ref 8.6–10.3)
Chloride: 101 mmol/L (ref 98–110)
Creat: 1.21 mg/dL (ref 0.60–1.35)
Globulin: 3 g/dL (calc) (ref 1.9–3.7)
Glucose, Bld: 83 mg/dL (ref 65–99)
Potassium: 4.2 mmol/L (ref 3.5–5.3)
Sodium: 139 mmol/L (ref 135–146)
Total Bilirubin: 2.1 mg/dL — ABNORMAL HIGH (ref 0.2–1.2)
Total Protein: 7.3 g/dL (ref 6.1–8.1)

## 2020-07-23 LAB — CBC
HCT: 42.9 % (ref 38.5–50.0)
Hemoglobin: 14.9 g/dL (ref 13.2–17.1)
MCH: 32.4 pg (ref 27.0–33.0)
MCHC: 34.7 g/dL (ref 32.0–36.0)
MCV: 93.3 fL (ref 80.0–100.0)
MPV: 9.2 fL (ref 7.5–12.5)
Platelets: 232 10*3/uL (ref 140–400)
RBC: 4.6 10*6/uL (ref 4.20–5.80)
RDW: 12.6 % (ref 11.0–15.0)
WBC: 7.9 10*3/uL (ref 3.8–10.8)

## 2020-07-23 LAB — QUANTIFERON-TB GOLD PLUS
Mitogen-NIL: 8.32 IU/mL
NIL: 0.03 IU/mL
QuantiFERON-TB Gold Plus: NEGATIVE
TB1-NIL: 0.01 IU/mL
TB2-NIL: 0 IU/mL

## 2020-07-23 LAB — IRON, TOTAL/TOTAL IRON BINDING CAP
%SAT: 33 % (calc) (ref 20–48)
Iron: 113 ug/dL (ref 50–180)
TIBC: 338 mcg/dL (calc) (ref 250–425)

## 2020-07-23 LAB — VITAMIN D 25 HYDROXY (VIT D DEFICIENCY, FRACTURES): Vit D, 25-Hydroxy: 46 ng/mL (ref 30–100)

## 2020-07-23 LAB — VITAMIN B12: Vitamin B-12: 367 pg/mL (ref 200–1100)

## 2020-07-23 LAB — FERRITIN: Ferritin: 35 ng/mL — ABNORMAL LOW (ref 38–380)

## 2020-07-24 NOTE — Progress Notes (Signed)
Please inform patient of the following:  Labs are all stable.  [name]. [name], MD 07/24/2020 11:10 AM

## 2020-09-21 ENCOUNTER — Telehealth: Payer: Self-pay | Admitting: Internal Medicine

## 2020-09-21 NOTE — Telephone Encounter (Signed)
[facility] rep called to advise the refill for Humira requires prior auth

## 2020-09-22 NOTE — Telephone Encounter (Signed)
Phone number for prior auth is [phone]. I spoke with rep  auth started.  Case number [id number].  They require office notes and labs faxed to [phone].  Notes faxed.

## 2020-09-26 NOTE — Telephone Encounter (Signed)
Humira approved until 09/22/2021

## 2020-10-29 ENCOUNTER — Encounter: Payer: Self-pay | Admitting: Family Medicine

## 2020-11-03 ENCOUNTER — Encounter: Payer: Self-pay | Admitting: Family Medicine

## 2020-11-03 ENCOUNTER — Other Ambulatory Visit (HOSPITAL_COMMUNITY)
Admission: RE | Admit: 2020-11-03 | Discharge: 2020-11-03 | Disposition: A | Discharge status: Home / Self Care | Payer: 59 | Source: Ambulatory Visit | Attending: Family Medicine | Admitting: Family Medicine

## 2020-11-03 ENCOUNTER — Ambulatory Visit (INDEPENDENT_AMBULATORY_CARE_PROVIDER_SITE_OTHER): Payer: 59 | Admitting: Family Medicine

## 2020-11-03 ENCOUNTER — Other Ambulatory Visit: Payer: Self-pay

## 2020-11-03 VITALS — BP 107/54 | HR 76 | Temp 98.2°F | Ht 70.0 in | Wt 152.4 lb

## 2020-11-03 DIAGNOSIS — Z1159 Encounter for screening for other viral diseases: Secondary | ICD-10-CM | POA: Diagnosis not present

## 2020-11-03 DIAGNOSIS — Z114 Encounter for screening for human immunodeficiency virus [HIV]: Secondary | ICD-10-CM | POA: Diagnosis not present

## 2020-11-03 DIAGNOSIS — E559 Vitamin D deficiency, unspecified: Secondary | ICD-10-CM

## 2020-11-03 DIAGNOSIS — Z113 Encounter for screening for infections with a predominantly sexual mode of transmission: Secondary | ICD-10-CM | POA: Insufficient documentation

## 2020-11-03 NOTE — Patient Instructions (Signed)
It was very nice to see you today!  We will check blood work and a urine sample today.  Take care, Dr [name]  PLEASE NOTE:  If you had any lab tests please let us know if you have not heard back within a few days. You may see your results on mychart before we have a chance to review them but we will give you a call once they are reviewed by [place]. If we ordered any referrals today, please let us know if you have not heard from their office within the next week.   Please try these tips to maintain a healthy lifestyle:   Eat at least 3 REAL meals and 1-2 snacks per day.  Aim for no more than 5 hours between eating.  If you eat breakfast, please do so within one hour of getting up.    Each meal should contain half fruits/vegetables, one quarter protein, and one quarter carbs (no bigger than a computer mouse)   Cut down on sweet beverages. This includes juice, soda, and sweet tea.     Drink at least 1 glass of water with each meal and aim for at least 8 glasses per day   Exercise at least 150 minutes every week.

## 2020-11-03 NOTE — Progress Notes (Signed)
   Charles [name] is a 50 y.o. male who presents today for an office visit.  Assessment/Plan:  New/Acute Problems: Screening for STDs No symptoms. Will check labs today.   Chronic Problems Addressed Today: Vitamin D deficiency Vitamin D level at goal. Continue supplementation 1000IU daily.      Subjective:  HPI:  Patient would like to be checked for STDs today.  No symptoms though has had unprotected intercourse within the last couple of months.       Objective:  Physical Exam: BP (!) 107/54   Pulse 76   Temp 98.2 F (36.8 C) (Temporal)   Ht 5' 10"  (1.778 m)   Wt 152 lb 6.4 oz (69.1 kg)   SpO2 100%   BMI 21.87 kg/m   Gen: No acute distress, resting comfortably Neuro: Grossly normal, moves all extremities Psych: Normal affect and thought content      Charles [name] M. [name], MD 11/03/2020 12:23 PM

## 2020-11-03 NOTE — Assessment & Plan Note (Signed)
Vitamin D level at goal. Continue supplementation 1000IU daily.

## 2020-11-06 LAB — HSV(HERPES SIMPLEX VRS) I + II AB-IGG
HAV 1 IGG,TYPE SPECIFIC AB: <0.9 index
HSV 2 IGG,TYPE SPECIFIC AB: <0.9 index

## 2020-11-06 LAB — URINE CYTOLOGY ANCILLARY ONLY
Chlamydia: NEGATIVE
Comment: NEGATIVE
Comment: NEGATIVE
Comment: NORMAL
Neisseria Gonorrhea: NEGATIVE
Trichomonas: NEGATIVE

## 2020-11-06 LAB — HIV ANTIBODY (ROUTINE TESTING W REFLEX): HIV 1&2 Ab, 4th Generation: NONREACTIVE

## 2020-11-06 LAB — HEPATITIS C ANTIBODY
Hepatitis C Ab: NONREACTIVE
SIGNAL TO CUT-OFF: 0.01 (ref <1.00)

## 2020-11-06 LAB — RPR: RPR Ser Ql: NONREACTIVE

## 2020-11-06 NOTE — Progress Notes (Signed)
Dr Marigene Ehlers interpretation of your lab work:  Good news! Your STD testing is all NORMAl.    If you have any additional questions, please give [place] a call or send [place] a message through [place].  Take care, Dr [name]

## 2020-11-07 ENCOUNTER — Ambulatory Visit (INDEPENDENT_AMBULATORY_CARE_PROVIDER_SITE_OTHER): Payer: 59 | Admitting: Internal Medicine

## 2020-11-07 ENCOUNTER — Encounter: Payer: Self-pay | Admitting: Family Medicine

## 2020-11-07 ENCOUNTER — Encounter: Payer: Self-pay | Admitting: Internal Medicine

## 2020-11-07 ENCOUNTER — Other Ambulatory Visit: Payer: Self-pay

## 2020-11-07 VITALS — BP 104/72 | HR 92 | Ht 69.25 in | Wt 155.2 lb

## 2020-11-07 DIAGNOSIS — K5080 Crohn's disease of both small and large intestine without complications: Secondary | ICD-10-CM

## 2020-11-07 DIAGNOSIS — K219 Gastro-esophageal reflux disease without esophagitis: Secondary | ICD-10-CM | POA: Diagnosis not present

## 2020-11-07 DIAGNOSIS — K91850 Pouchitis: Secondary | ICD-10-CM

## 2020-11-07 DIAGNOSIS — Z79899 Other long term (current) drug therapy: Secondary | ICD-10-CM | POA: Diagnosis not present

## 2020-11-07 DIAGNOSIS — Z7960 Long term (current) use of unspecified immunomodulators and immunosuppressants: Secondary | ICD-10-CM

## 2020-11-07 NOTE — Assessment & Plan Note (Signed)
Not symptomatic.  Continue Humira.  We are going to check adalimumab levels and antibodies.  Return to clinic in 1 year or sooner as needed.

## 2020-11-07 NOTE — Patient Instructions (Signed)
Please come and get lab work done 11/16/2020, no appointment needed.  Due to recent changes in healthcare laws, you may see the results of your imaging and laboratory studies on MyChart before your provider has had a chance to review them.  We understand that in some cases there may be results that are confusing or concerning to you. Not all laboratory results come back in the same time frame and the provider may be waiting for multiple results in order to interpret others.  Please give [place] 48 hours in order for your provider to thoroughly review all the results before contacting the office for clarification of your results.   I appreciate the opportunity to care for you. Silvano Rusk, MD, [facility]

## 2020-11-07 NOTE — Assessment & Plan Note (Signed)
Doing well without problems at this time.

## 2020-11-07 NOTE — Assessment & Plan Note (Signed)
No problems noted.  Will recommend Shingrix if he has not had it.  I do not think so when I looked at his immunizations.  I will send him a message.

## 2020-11-07 NOTE — Progress Notes (Signed)
Charles [name] 50 y.o. [birth year] [id number]  Assessment & Plan:  Crohn's disease [facility]) Doing well without problems at this time.  CROHN'S DISEASE, LARGE AND SMALL INTESTINES Status post colectomy without active disease on Humira.  Does have pouchitis.  Long-term use of immunosuppressant medication-Humira No problems noted.  Will recommend Shingrix if he has not had it.  I do not think so when I looked at his immunizations.  I will send him a message.  Ileal pouchitis ([facility]) Not symptomatic.  Continue Humira.  We are going to check adalimumab levels and antibodies.  Return to clinic in 1 year or sooner as needed.   I appreciate the opportunity to care for this patient. CC: Vivi Barrack, MD    Subjective:   Chief Complaint: Follow-up of Crohn's disease and pouchitis  HPI Charles [name] is a 50 year old white man with a long history of Crohn's disease status post colectomy he has pouchitis and has a history of pulmonary complications of Crohn's disease, maintained on Humira reporting that he is feeling well.  His pouchitis does not seem to be acting up, no significant diarrhea multiple soft bowel movements a day as typical.  Occasional heartburn symptoms if he has a few too many drinks of bourbon treated with Tums but no longer on PPI for history of GERD.  He feels as well as he has had in a long time.  The emotional turmoil of a divorce has abated and he has a good relationship with his ex-wife and their coparenting well at this time.   Last pouch exam 2018 with stable mild inflammation Wt Readings from Last 3 Encounters:  11/07/20 155 lb 4 oz (70.4 kg)  11/03/20 152 lb 6.4 oz (69.1 kg)  07/21/20 149 lb 12.8 oz (67.9 kg)    Allergies  Allergen Reactions  . Mercaptopurine Nausea And Vomiting    Felt ill within 2 days of initiating therapy.   Current Meds  Medication Sig  . AMBULATORY NON FORMULARY MEDICATION Inject into the vein. Iron Infusion  monthly  . budesonide-formoterol  (SYMBICORT) 160-4.5 MCG/ACT inhaler INHALE 1 PUFF INTO THE LUNGS TWICE DAILY  . celecoxib (CELEBREX) 100 MG capsule TAKE 1 CAPSULE BY MOUTH 2 TIMES DAILY AS NEEDED  . cholecalciferol (VITAMIN D) 1000 units tablet Take 1,000 Units by mouth daily.  Marland Kitchen HUMIRA PEN 40 MG/0.8ML PNKT INJECT ONE PEN SUBCUTANEOUSLY EVERY OTHER WEEK. REFRIGERATE.  . montelukast (SINGULAIR) 10 MG tablet Take 1 tablet (10 mg total) by mouth at bedtime.   Past Medical History:  Diagnosis Date  . Allergy   . Arthritis   . Arthropathy    and scroilitis  . Asthma   . Blood transfusion without reported diagnosis   . 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 Crohn's ileocolitis ([place]) 08/29/2008  . Dysplastic nevi   . GERD (gastroesophageal reflux disease)   . Herniated disc   . History of proctitis 2006  . IBD (inflammatory bowel disease)   . Incisional hernia   . Migraine headache   . Morton's neuroma of right foot   . Osteopenia   . Pouchitis ([place]) 2006  . Small bowel obstruction due to adhesions ([place]) 02/09/2015  . Thyroid disease   . Vitamin D deficiency    Past Surgical History:  Procedure Laterality Date  . COLECTOMY    . COLONOSCOPY W/ BIOPSIES  04/16/2006   crohn's colitis  . FLEXIBLE SIGMOIDOSCOPY  2006 - 02/2014   ileocolitis, pouchitis.  with pouch ulcer.   Marland Kitchen  FUNCTIONAL ENDOSCOPIC SINUS SURGERY  12/2003   partial ethmoidectomy.  Dr Wilburn Cornelia  . ileoanal pull-through    . LUMBAR LAMINECTOMY/DECOMPRESSION MICRODISCECTOMY Right 10/13/2015   Procedure: Right Lumbar four- five Microdiskectomy;  Surgeon: Erline Levine, MD;  Location: [facility];  Service: Neurosurgery;  Laterality: Right;  Right L4-5 Microdiskectomy  . sacroilitis    . SIGMOIDOSCOPY  01/2005  . VENTRAL HERNIA REPAIR  06/2014   Social History   Social History Narrative   Father a Technical brewer in [place].   Married to Dr. 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   Review of Systems As per HPI  Objective:   Physical Exam BP 104/72 (BP Location: Left Arm, Patient Position: Sitting, Cuff Size: Normal)   Pulse 92   Ht 5' 9.25" (1.759 m) Comment: height measured without shoes  Wt 155 lb 4 oz (70.4 kg)   BMI 22.76 kg/m  Thin but well-developed well-nourished white man in no acute distress Eyes anicteric Lungs are clear Heart sounds are normal S1-S2 no rubs murmurs or gallops The abdomen has well-healed surgical scars a scaphoid soft and nontender

## 2020-11-07 NOTE — Assessment & Plan Note (Signed)
Status post colectomy without active disease on Humira.  Does have pouchitis.

## 2020-11-16 ENCOUNTER — Other Ambulatory Visit: Payer: 59

## 2020-11-16 DIAGNOSIS — K91850 Pouchitis: Secondary | ICD-10-CM

## 2020-11-26 LAB — SERIAL MONITORING

## 2020-11-27 LAB — ADALIMUMAB+AB (SERIAL MONITOR)
Adalimumab Drug Level: 4.5 ug/mL
Anti-Adalimumab Antibody: 57 ng/mL

## 2020-12-25 ENCOUNTER — Encounter: Payer: Self-pay | Admitting: Family Medicine

## 2020-12-26 ENCOUNTER — Other Ambulatory Visit: Payer: Self-pay | Admitting: *Deleted

## 2020-12-26 MED ORDER — RIZATRIPTAN BENZOATE 5 MG PO TABS: 5.0000 mg | ORAL_TABLET | ORAL | 0 refills | Status: DC | PRN

## 2020-12-26 NOTE — Telephone Encounter (Signed)
See note

## 2021-01-31 ENCOUNTER — Telehealth: Payer: Self-pay | Admitting: Internal Medicine

## 2021-01-31 DIAGNOSIS — Z7960 Long term (current) use of unspecified immunomodulators and immunosuppressants: Secondary | ICD-10-CM

## 2021-01-31 DIAGNOSIS — K5080 Crohn's disease of both small and large intestine without complications: Secondary | ICD-10-CM

## 2021-01-31 DIAGNOSIS — Z79899 Other long term (current) drug therapy: Secondary | ICD-10-CM

## 2021-01-31 DIAGNOSIS — K91850 Pouchitis: Secondary | ICD-10-CM

## 2021-01-31 NOTE — Telephone Encounter (Signed)
Follow-up conversation regarding medication difficulties  Drug discount card came through so Humira is now affordable again  Reviewed previous issue with 6-MP in that it was not really clear what happened we do not know that he had an idiosyncratic reaction or true allergy.  He is inclined to try azathioprine and so am I.  He has been apprised that azathioprine is metabolized to 6-MP so he could have problems.  We will do the usual serologic testing to understand metabolism etc. before trying this.   Keep Entyvio and/or Stelara in reserve.

## 2021-02-01 ENCOUNTER — Other Ambulatory Visit (INDEPENDENT_AMBULATORY_CARE_PROVIDER_SITE_OTHER): Payer: 59

## 2021-02-01 DIAGNOSIS — K91850 Pouchitis: Secondary | ICD-10-CM | POA: Diagnosis not present

## 2021-02-01 DIAGNOSIS — Z79899 Other long term (current) drug therapy: Secondary | ICD-10-CM | POA: Diagnosis not present

## 2021-02-01 DIAGNOSIS — K5080 Crohn's disease of both small and large intestine without complications: Secondary | ICD-10-CM | POA: Diagnosis not present

## 2021-02-01 DIAGNOSIS — Z7960 Long term (current) use of unspecified immunomodulators and immunosuppressants: Secondary | ICD-10-CM

## 2021-02-01 LAB — CBC WITH DIFFERENTIAL/PLATELET
Basophils Absolute: 0 10*3/uL (ref 0.0–0.1)
Basophils Relative: 0.4 % (ref 0.0–3.0)
Eosinophils Absolute: 0.2 10*3/uL (ref 0.0–0.7)
Eosinophils Relative: 2 % (ref 0.0–5.0)
HCT: 44.6 % (ref 39.0–52.0)
Hemoglobin: 15.9 g/dL (ref 13.0–17.0)
Lymphocytes Relative: 20 % (ref 12.0–46.0)
Lymphs Abs: 1.7 10*3/uL (ref 0.7–4.0)
MCHC: 35.6 g/dL (ref 30.0–36.0)
MCV: 92 fl (ref 78.0–100.0)
Monocytes Absolute: 0.6 10*3/uL (ref 0.1–1.0)
Monocytes Relative: 7 % (ref 3.0–12.0)
Neutro Abs: 6 10*3/uL (ref 1.4–7.7)
Neutrophils Relative %: 70.6 % (ref 43.0–77.0)
Platelets: 275 10*3/uL (ref 150.0–400.0)
RBC: 4.85 Mil/uL (ref 4.22–5.81)
RDW: 12.3 % (ref 11.5–15.5)
WBC: 8.5 10*3/uL (ref 4.0–10.5)

## 2021-02-01 LAB — COMPREHENSIVE METABOLIC PANEL
ALT: 16 U/L (ref 0–53)
AST: 20 U/L (ref 0–37)
Albumin: 4.4 g/dL (ref 3.5–5.2)
Alkaline Phosphatase: 66 U/L (ref 39–117)
BUN: 12 mg/dL (ref 6–23)
CO2: 32 mEq/L (ref 19–32)
Calcium: 9.9 mg/dL (ref 8.4–10.5)
Chloride: 99 mEq/L (ref 96–112)
Creatinine, Ser: 1.17 mg/dL (ref 0.40–1.50)
GFR: 73.83 mL/min (ref >60.00)
Glucose, Bld: 65 mg/dL — ABNORMAL LOW (ref 70–99)
Potassium: 4 mEq/L (ref 3.5–5.1)
Sodium: 139 mEq/L (ref 135–145)
Total Bilirubin: 2.1 mg/dL — ABNORMAL HIGH (ref 0.2–1.2)
Total Protein: 8 g/dL (ref 6.0–8.3)

## 2021-02-01 LAB — SEDIMENTATION RATE: Sed Rate: 9 mm/hr (ref 0–15)

## 2021-02-01 LAB — C-REACTIVE PROTEIN: CRP: <1 mg/dL (ref 0.5–20.0)

## 2021-02-08 LAB — THIOPURINE METHYLTRANSFERASE (TPMT), RBC: Thiopurine Methyltransferase, RBC: 15 nmol/hr/mL RBC

## 2021-02-20 ENCOUNTER — Other Ambulatory Visit: Payer: Self-pay | Admitting: Internal Medicine

## 2021-02-20 DIAGNOSIS — Z79899 Other long term (current) drug therapy: Secondary | ICD-10-CM

## 2021-02-20 DIAGNOSIS — K91850 Pouchitis: Secondary | ICD-10-CM

## 2021-02-20 DIAGNOSIS — Z7960 Long term (current) use of unspecified immunomodulators and immunosuppressants: Secondary | ICD-10-CM

## 2021-02-20 MED ORDER — AZATHIOPRINE 50 MG PO TABS: 100.0000 mg | ORAL_TABLET | Freq: Every day | ORAL | 0 refills | Status: DC

## 2021-02-27 ENCOUNTER — Observation Stay (HOSPITAL_BASED_OUTPATIENT_CLINIC_OR_DEPARTMENT_OTHER)
Admission: EM | Admit: 2021-02-27 | Discharge: 2021-03-01 | Disposition: A | Discharge status: Home / Self Care | Payer: 59 | Attending: Internal Medicine | Admitting: Internal Medicine

## 2021-02-27 ENCOUNTER — Emergency Department (HOSPITAL_BASED_OUTPATIENT_CLINIC_OR_DEPARTMENT_OTHER): Payer: 59

## 2021-02-27 ENCOUNTER — Encounter (HOSPITAL_BASED_OUTPATIENT_CLINIC_OR_DEPARTMENT_OTHER): Payer: Self-pay | Admitting: *Deleted

## 2021-02-27 ENCOUNTER — Other Ambulatory Visit: Payer: Self-pay

## 2021-02-27 DIAGNOSIS — T8069XA Other serum reaction due to other serum, initial encounter: Secondary | ICD-10-CM | POA: Diagnosis not present

## 2021-02-27 DIAGNOSIS — Z79899 Other long term (current) drug therapy: Secondary | ICD-10-CM | POA: Insufficient documentation

## 2021-02-27 DIAGNOSIS — R509 Fever, unspecified: Secondary | ICD-10-CM | POA: Diagnosis present

## 2021-02-27 DIAGNOSIS — T451X5A Adverse effect of antineoplastic and immunosuppressive drugs, initial encounter: Secondary | ICD-10-CM | POA: Diagnosis present

## 2021-02-27 DIAGNOSIS — Z20822 Contact with and (suspected) exposure to covid-19: Secondary | ICD-10-CM | POA: Insufficient documentation

## 2021-02-27 DIAGNOSIS — K853 Drug induced acute pancreatitis without necrosis or infection: Secondary | ICD-10-CM | POA: Diagnosis present

## 2021-02-27 DIAGNOSIS — K859 Acute pancreatitis without necrosis or infection, unspecified: Secondary | ICD-10-CM | POA: Diagnosis not present

## 2021-02-27 DIAGNOSIS — K5090 Crohn's disease, unspecified, without complications: Secondary | ICD-10-CM | POA: Diagnosis present

## 2021-02-27 DIAGNOSIS — J45909 Unspecified asthma, uncomplicated: Secondary | ICD-10-CM | POA: Insufficient documentation

## 2021-02-27 DIAGNOSIS — I4581 Long QT syndrome: Secondary | ICD-10-CM | POA: Insufficient documentation

## 2021-02-27 DIAGNOSIS — R109 Unspecified abdominal pain: Secondary | ICD-10-CM | POA: Diagnosis present

## 2021-02-27 LAB — CBC WITH DIFFERENTIAL/PLATELET
Abs Immature Granulocytes: 0.04 10*3/uL (ref 0.00–0.07)
Basophils Absolute: 0 10*3/uL (ref 0.0–0.1)
Basophils Relative: 0 %
Eosinophils Absolute: 0.1 10*3/uL (ref 0.0–0.5)
Eosinophils Relative: 1 %
HCT: 43 % (ref 39.0–52.0)
Hemoglobin: 15.1 g/dL (ref 13.0–17.0)
Immature Granulocytes: 0 %
Lymphocytes Relative: 3 %
Lymphs Abs: 0.3 10*3/uL — ABNORMAL LOW (ref 0.7–4.0)
MCH: 31.7 pg (ref 26.0–34.0)
MCHC: 35.1 g/dL (ref 30.0–36.0)
MCV: 90.3 fL (ref 80.0–100.0)
Monocytes Absolute: 0.3 10*3/uL (ref 0.1–1.0)
Monocytes Relative: 3 %
Neutro Abs: 9.8 10*3/uL — ABNORMAL HIGH (ref 1.7–7.7)
Neutrophils Relative %: 93 %
Platelets: 181 10*3/uL (ref 150–400)
RBC: 4.76 MIL/uL (ref 4.22–5.81)
RDW: 12.1 % (ref 11.5–15.5)
WBC: 10.5 10*3/uL (ref 4.0–10.5)
nRBC: 0 % (ref 0.0–0.2)

## 2021-02-27 LAB — COMPREHENSIVE METABOLIC PANEL
ALT: 22 U/L (ref 0–44)
AST: 33 U/L (ref 15–41)
Albumin: 4.2 g/dL (ref 3.5–5.0)
Alkaline Phosphatase: 66 U/L (ref 38–126)
Anion gap: 9 (ref 5–15)
BUN: 12 mg/dL (ref 6–20)
CO2: 27 mmol/L (ref 22–32)
Calcium: 9.4 mg/dL (ref 8.9–10.3)
Chloride: 101 mmol/L (ref 98–111)
Creatinine, Ser: 0.85 mg/dL (ref 0.61–1.24)
GFR, Estimated: >60 mL/min (ref >60)
Glucose, Bld: 99 mg/dL (ref 70–99)
Potassium: 4.3 mmol/L (ref 3.5–5.1)
Sodium: 137 mmol/L (ref 135–145)
Total Bilirubin: 1.8 mg/dL — ABNORMAL HIGH (ref 0.3–1.2)
Total Protein: 7.3 g/dL (ref 6.5–8.1)

## 2021-02-27 LAB — URINALYSIS, ROUTINE W REFLEX MICROSCOPIC
Bilirubin Urine: NEGATIVE
Glucose, UA: NEGATIVE mg/dL
Hgb urine dipstick: NEGATIVE
Ketones, ur: 15 mg/dL — AB
Leukocytes,Ua: NEGATIVE
Nitrite: NEGATIVE
Protein, ur: NEGATIVE mg/dL
Specific Gravity, Urine: 1.021 (ref 1.005–1.030)
pH: 5.5 (ref 5.0–8.0)

## 2021-02-27 LAB — RESP PANEL BY RT-PCR (FLU A&B, COVID) ARPGX2
Influenza A by PCR: NEGATIVE
Influenza B by PCR: NEGATIVE
SARS Coronavirus 2 by RT PCR: NEGATIVE

## 2021-02-27 LAB — LIPASE, BLOOD: Lipase: 400 U/L — ABNORMAL HIGH (ref 11–51)

## 2021-02-27 MED ORDER — MORPHINE SULFATE (PF) 4 MG/ML IV SOLN
6.0000 mg | Freq: Once | INTRAVENOUS | Status: AC
  Administered 2021-02-27: 6 mg via INTRAVENOUS
  Filled 2021-02-27: qty 2

## 2021-02-27 MED ORDER — IOHEXOL 300 MG/ML  SOLN
100.0000 mL | Freq: Once | INTRAMUSCULAR | Status: AC | PRN
  Administered 2021-02-27: 100 mL via INTRAVENOUS

## 2021-02-27 MED ORDER — ACETAMINOPHEN 325 MG PO TABS
650.0000 mg | ORAL_TABLET | Freq: Once | ORAL | Status: AC
  Administered 2021-02-27: 650 mg via ORAL
  Filled 2021-02-27: qty 2

## 2021-02-27 MED ORDER — LACTATED RINGERS IV BOLUS
1000.0000 mL | Freq: Once | INTRAVENOUS | Status: AC
  Administered 2021-02-27: 1000 mL via INTRAVENOUS

## 2021-02-27 MED ORDER — LACTATED RINGERS IV SOLN: INTRAVENOUS | Status: DC

## 2021-02-27 MED ORDER — METOCLOPRAMIDE HCL 5 MG/ML IJ SOLN
5.0000 mg | Freq: Once | INTRAMUSCULAR | Status: AC
  Administered 2021-02-27: 5 mg via INTRAVENOUS
  Filled 2021-02-27: qty 2

## 2021-02-27 NOTE — ED Provider Notes (Signed)
[facility] EMERGENCY DEPT Provider Note   CSN: [id number] Arrival date & time: 02/27/21  1821     History Chief Complaint  Patient presents with   Medication Reaction    Charles [name] is a 50 y.o. male.  50 year old male who presents with 3 days of myalgias with new onset of fever today.  Patient started taking azathioprine for his Crohn's disease and thought that this may be causing reaction.  He developed a sore throat today.  He has had nausea but no vomiting.  No cough.  Has had some abdominal discomfort which she describes as diffuse in nature.  He has had negative home COVID test.  Endorses diffuse weakness.      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   Dysplastic nevi    GERD (gastroesophageal reflux disease)    Herniated disc    History of proctitis 2006   IBD (inflammatory bowel disease)    Incisional hernia    Migraine headache    Morton's neuroma of right foot    Osteopenia    Pouchitis ([place]) 2006   Small bowel obstruction due to adhesions ([place]) 02/09/2015   Thyroid disease    Vitamin D deficiency     Patient Active Problem List   Diagnosis Date Noted   Degenerative disc disease, lumbar 06/13/2020   Synovitis of right knee 04/26/2020   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   Allergic rhinitis 11/10/2018   GERD (gastroesophageal reflux disease) 09/25/2018   Anxiety state 07/05/2016   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  with pouch ulcer.    FUNCTIONAL ENDOSCOPIC SINUS SURGERY  12/2003   partial ethmoidectomy.  Dr Wilburn Cornelia   ileoanal pull-through     LUMBAR LAMINECTOMY/DECOMPRESSION MICRODISCECTOMY Right 10/13/2015   Procedure: Right Lumbar four- five Microdiskectomy;  Surgeon: Erline Levine, MD;  Location: [facility];  Service: Neurosurgery;  Laterality: Right;  Right L4-5 Microdiskectomy   sacroilitis     SIGMOIDOSCOPY  01/2005   VENTRAL HERNIA REPAIR  06/2014       Family History  Problem Relation Age of Onset   Ulcerative colitis Mother    Colon cancer Paternal Grandmother    Brain cancer Paternal Grandmother        mets from colon    Social History   Tobacco Use   Smoking status: Never   Smokeless tobacco: Never  Vaping Use   Vaping Use: Never used  Substance Use Topics   Alcohol use: Yes    Alcohol/week: 14.0 standard drinks    Types: 14 Glasses of wine per week    Comment: couple of drinks daily   Drug use: No    Home Medications Prior to Admission medications   Medication Sig Start Date End  Date Taking? Authorizing Provider  AMBULATORY NON FORMULARY MEDICATION Inject into the vein. [facility]  monthly    [provider]  azaTHIOprine (IMURAN) 50 MG tablet Take 2 tablets (100 mg total) by mouth daily. 02/20/21   Gatha Mayer, MD  budesonide-formoterol [facility]) 160-4.5 MCG/ACT inhaler INHALE 1 PUFF INTO THE LUNGS TWICE DAILY 01/20/20   Vivi Barrack, MD  celecoxib (CELEBREX) 100 MG capsule TAKE 1 CAPSULE BY MOUTH 2 TIMES DAILY AS NEEDED 04/12/20   Gatha Mayer, MD  cholecalciferol (VITAMIN D) 1000 units tablet Take 1,000 Units by mouth daily.    [provider]  HUMIRA PEN 63 MG/0.8ML PNKT INJECT ONE PEN SUBCUTANEOUSLY EVERY OTHER WEEK.  REFRIGERATE. 06/26/20   Gatha Mayer, MD  montelukast (SINGULAIR) 10 MG tablet Take 1 tablet (10 mg total) by mouth at bedtime. 06/13/20   Lyndal Pulley, DO  rizatriptan (MAXALT) 5 MG tablet Take 1 tablet (5 mg total) by mouth as needed for migraine. May repeat in 2 hours if needed 12/26/20   Vivi Barrack, MD    Allergies    Mercaptopurine  Review of Systems   Review of Systems  All other systems reviewed and are negative.  Physical Exam Updated Vital Signs BP 120/82 (BP Location: Right Arm)   Pulse (!) 106   Temp 100.2 F (37.9 C) (Oral)   Resp 14   Ht 1.778 m (5' 10" )   Wt 68 kg   SpO2 99%   BMI 21.52 kg/m   Physical Exam Vitals and nursing note reviewed.  Constitutional:      General: He is not in acute distress.    Appearance: Normal appearance. He is well-developed. He is not toxic-appearing.  HENT:     Head: Normocephalic and atraumatic.  Eyes:     General: Lids are normal.     Conjunctiva/sclera: Conjunctivae normal.     Pupils: Pupils are equal, round, and reactive to light.  Neck:     Thyroid: No thyroid mass.     Trachea: No tracheal deviation.  Cardiovascular:     Rate and Rhythm: Normal rate and regular rhythm.     Heart sounds: Normal heart sounds. No murmur heard.   No gallop.  Pulmonary:     Effort: Pulmonary effort is normal. No respiratory distress.     Breath sounds: Normal breath sounds. No stridor. No decreased breath sounds, wheezing, rhonchi or rales.  Abdominal:     General: There is no distension.     Palpations: Abdomen is soft.     Tenderness: There is abdominal tenderness in the epigastric area. There is no guarding or rebound.  Musculoskeletal:        General: No tenderness. Normal range of motion.     Cervical back: Normal range of motion and neck supple.  Skin:    General: Skin is warm and dry.     Findings: No abrasion or rash.  Neurological:     Mental Status: He is alert and oriented to person, place, and time. Mental  status is at baseline.     GCS: GCS eye subscore is 4. GCS verbal subscore is 5. GCS motor subscore is 6.     Cranial Nerves: Cranial nerves are intact. No cranial nerve deficit.     Sensory: No sensory deficit.     Motor: Motor function is intact.  Psychiatric:        Attention and Perception: Attention normal.  Speech: Speech normal.        Behavior: Behavior normal.    ED Results / Procedures / Treatments   Labs (all labs ordered are listed, but only abnormal results are displayed) Labs Reviewed  RESP PANEL BY RT-PCR (FLU A&B, COVID) [id number]  CBC WITH DIFFERENTIAL/PLATELET  COMPREHENSIVE METABOLIC PANEL  LIPASE, BLOOD  URINALYSIS, ROUTINE W REFLEX MICROSCOPIC    EKG None  Radiology No results found.  Procedures Procedures   Medications Ordered in ED Medications  lactated ringers infusion (has no administration in time range)  lactated ringers bolus 1,000 mL (has no administration in time range)    ED Course  I have reviewed the triage vital signs and the nursing notes.  Pertinent labs & imaging results that were available during my care of the patient were reviewed by me and considered in my medical decision making (see chart for details).    MDM Rules/Calculators/A&P                          Patient with lipase of 400 here.  Abdominal CT however does not show any acute findings.  He has had epigastric pain treated with morphine x2.  Given IV fluids here.  Will admit to the hospital service Final Clinical Impression(s) / ED Diagnoses Final diagnoses:  None    Rx / DC Orders ED Discharge Orders     None        Lacretia Leigh, MD 02/27/21 2105

## 2021-02-27 NOTE — ED Triage Notes (Signed)
Pt thinks he may be having a hypersensitivity reaction. Azathioprine. Pt is having low back, knees and ankle pain, headache, chills, chest tightness, upper lip numbness, sore throat. Fever. States he has tested 3 days straight for Covid, all Negative.

## 2021-02-28 ENCOUNTER — Other Ambulatory Visit: Payer: Self-pay | Admitting: Internal Medicine

## 2021-02-28 ENCOUNTER — Encounter (HOSPITAL_COMMUNITY): Payer: Self-pay | Admitting: Internal Medicine

## 2021-02-28 DIAGNOSIS — T8069XA Other serum reaction due to other serum, initial encounter: Secondary | ICD-10-CM | POA: Diagnosis not present

## 2021-02-28 DIAGNOSIS — T451X5A Adverse effect of antineoplastic and immunosuppressive drugs, initial encounter: Secondary | ICD-10-CM | POA: Diagnosis present

## 2021-02-28 DIAGNOSIS — K859 Acute pancreatitis without necrosis or infection, unspecified: Secondary | ICD-10-CM | POA: Diagnosis present

## 2021-02-28 DIAGNOSIS — K853 Drug induced acute pancreatitis without necrosis or infection: Secondary | ICD-10-CM

## 2021-02-28 HISTORY — DX: Drug induced acute pancreatitis without necrosis or infection: K85.30

## 2021-02-28 LAB — LACTIC ACID, PLASMA: Lactic Acid, Venous: 1.5 mmol/L (ref 0.5–1.9)

## 2021-02-28 MED ORDER — HYDROMORPHONE HCL 1 MG/ML IJ SOLN
1.0000 mg | INTRAMUSCULAR | Status: DC | PRN
  Administered 2021-02-28 (×2): 1 mg via INTRAVENOUS
  Filled 2021-02-28 (×2): qty 1

## 2021-02-28 MED ORDER — ONDANSETRON HCL 4 MG/2ML IJ SOLN: 4.0000 mg | Freq: Four times a day (QID) | INTRAMUSCULAR | Status: DC | PRN

## 2021-02-28 MED ORDER — FLUTICASONE FUROATE-VILANTEROL 200-25 MCG/INH IN AEPB
1.0000 | INHALATION_SPRAY | Freq: Every day | RESPIRATORY_TRACT | Status: DC
  Filled 2021-02-28: qty 28

## 2021-02-28 MED ORDER — ACETAMINOPHEN 325 MG PO TABS
650.0000 mg | ORAL_TABLET | Freq: Four times a day (QID) | ORAL | Status: DC | PRN
  Filled 2021-02-28: qty 2

## 2021-02-28 MED ORDER — BISACODYL 5 MG PO TBEC: 5.0000 mg | DELAYED_RELEASE_TABLET | Freq: Every day | ORAL | Status: DC | PRN

## 2021-02-28 MED ORDER — MORPHINE SULFATE (PF) 2 MG/ML IV SOLN
2.0000 mg | INTRAVENOUS | Status: DC | PRN
  Administered 2021-02-28: 2 mg via INTRAVENOUS
  Filled 2021-02-28: qty 1

## 2021-02-28 MED ORDER — HYDROMORPHONE HCL 1 MG/ML IJ SOLN
1.0000 mg | INTRAMUSCULAR | Status: DC | PRN
  Administered 2021-02-28 – 2021-03-01 (×2): 1 mg via INTRAVENOUS
  Filled 2021-02-28 (×2): qty 1

## 2021-02-28 MED ORDER — SENNOSIDES-DOCUSATE SODIUM 8.6-50 MG PO TABS: 1.0000 | ORAL_TABLET | Freq: Every evening | ORAL | Status: DC | PRN

## 2021-02-28 MED ORDER — MAGNESIUM CITRATE PO SOLN: 1.0000 | Freq: Once | ORAL | Status: DC | PRN

## 2021-02-28 MED ORDER — ACETAMINOPHEN 650 MG RE SUPP: 650.0000 mg | Freq: Four times a day (QID) | RECTAL | Status: DC | PRN

## 2021-02-28 MED ORDER — MORPHINE SULFATE (PF) 4 MG/ML IV SOLN
6.0000 mg | Freq: Once | INTRAVENOUS | Status: AC
  Administered 2021-02-28: 6 mg via INTRAVENOUS
  Filled 2021-02-28: qty 2

## 2021-02-28 MED ORDER — METHYLPREDNISOLONE SODIUM SUCC 125 MG IJ SOLR
80.0000 mg | Freq: Every day | INTRAMUSCULAR | Status: AC
  Administered 2021-02-28 – 2021-03-01 (×2): 80 mg via INTRAVENOUS
  Filled 2021-02-28 (×2): qty 2

## 2021-02-28 MED ORDER — SUMATRIPTAN SUCCINATE 50 MG PO TABS
50.0000 mg | ORAL_TABLET | ORAL | Status: DC | PRN
  Filled 2021-02-28: qty 1

## 2021-02-28 MED ORDER — ONDANSETRON HCL 4 MG PO TABS: 4.0000 mg | ORAL_TABLET | Freq: Four times a day (QID) | ORAL | Status: DC | PRN

## 2021-02-28 MED ORDER — ENOXAPARIN SODIUM 40 MG/0.4ML IJ SOSY
40.0000 mg | PREFILLED_SYRINGE | INTRAMUSCULAR | Status: DC
  Administered 2021-02-28: 40 mg via SUBCUTANEOUS
  Filled 2021-02-28: qty 0.4

## 2021-02-28 MED ORDER — ACETAMINOPHEN 500 MG PO TABS
1000.0000 mg | ORAL_TABLET | Freq: Once | ORAL | Status: AC
  Administered 2021-02-28: 1000 mg via ORAL
  Filled 2021-02-28: qty 2

## 2021-02-28 NOTE — ED Notes (Addendum)
Pt has left with girlfriend POV to [facility]. [facility] Network engineer and primary nurse notified.

## 2021-02-28 NOTE — Consult Note (Addendum)
Consultation  Referring Provider: [facility]/ [place] Primary Care Physician:  Vivi Barrack, MD Primary Gastroenterologist:  Dr.Loys Hoselton  Reason for Consultation:  Hx Crohns, acute illness with HA, severe arthralgias, rash, weakness, fever  HPI: Charles [name] is a 50 y.o. male with long history of Crohn's disease of the large and small bowel, he is status post remote total colectomy and has diagnosis of pouchitis which has been minimally symptomatic.  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 Patient has been maintained on Humira.  At most recent office visit in March she had Humira drug level and antibody levels done and did have a positive low-level antibody at 57. Decision was made to start him on azathioprine.  He had been on 6-MP at some point remotely in the past, and remembers being ill after being started on the 6-MP but he was also in the midst of severely active Crohn's disease. Patient started the azathioprine at 100 mg/day on 02/19/2021.  He says he felt okay for the first couple of days then developed some vague abdominal bloating and discomfort.  By this past weekend he had developed significant weakness, headache joint pains and back pains which have settled into his knees and ankles which are bothering him most severely at present.  On Sunday, 02/25/2021 he decided to split the dose of azathioprine and took 50 mg in the morning.  He developed worsening pain in his knees and ankles worsening weakness fever headache.  He says he felt hot and cold and very flushed but when he took his temp did not register a fever.  Yesterday, Tuesday when he woke up he says he felt a little bit better, decided to take 50 mg of azathioprine again within a couple of hours of that was feeling horrible with severe headache, some sore throat, he says his whole  body was hurting with severe joint pains and nausea without vomiting and the ongoing vague abdominal discomfort.  He is now aware that he has developed a rash on both of his knees and ankles says this is not pruritic but he has a lot of warmth and pain in these areas. Went to the ER last night where he had CT of the abdomen pelvis done with contrast which showed a normal-appearing pancreas, status post colectomy with ileoanal anastomosis, no evidence of active .  Noted several small periaortic nodes similar to prior imaging  Labs show WBC of 10.5 hemoglobin 15/hematocrit of 43 c-Met within normal limits/normal LFTs with exception of T bili 1.8, lipase of 400.  Blood cultures pending.  Respiratory panel negative.  He has been admitted for further diagnostic work-up.  He says morphine is helping a little bit but not making a whole lot of difference for his severe joint pains.  Patient was feeling very warm well I was in the room and repeat temp now up to 102.3.   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  with pouch ulcer.    FUNCTIONAL ENDOSCOPIC SINUS SURGERY  12/2003   partial ethmoidectomy.  Dr Wilburn Cornelia    ileoanal pull-through     LUMBAR LAMINECTOMY/DECOMPRESSION MICRODISCECTOMY Right 10/13/2015   Procedure: Right Lumbar four- five Microdiskectomy;  Surgeon: Erline Levine, MD;  Location: [facility];  Service: Neurosurgery;  Laterality: Right;  Right L4-5 Microdiskectomy   sacroilitis     SIGMOIDOSCOPY  01/2005   VENTRAL HERNIA REPAIR  06/2014    Prior to Admission medications   Medication Sig Start Date End Date Taking? Authorizing Provider  Ascorbic Acid (VITAMIN C PO) Take 1 tablet by mouth daily.   Yes [provider]  budesonide-formoterol (SYMBICORT) 160-4.5 MCG/ACT inhaler INHALE 1 PUFF INTO THE LUNGS TWICE DAILY Patient taking differently: Inhale 2 puffs into the lungs 2 (two) times daily as needed (shortness of breath, wheezing). 01/20/20  Yes Vivi Barrack, MD  celecoxib (CELEBREX) 100 MG capsule TAKE 1 CAPSULE BY MOUTH 2 TIMES DAILY AS NEEDED Patient taking differently: Take 100 mg by mouth 2 (two) times daily as needed for mild pain or moderate pain. 04/12/20  Yes Gatha Mayer, MD  cholecalciferol (VITAMIN D) 1000 units tablet Take 1,000 Units by mouth daily.   Yes [provider]  Cyanocobalamin (VITAMIN B-12 PO) Take 1 tablet by mouth daily.   Yes [provider]  montelukast (SINGULAIR) 10 MG tablet Take 1 tablet (10 mg total) by mouth at bedtime. Patient taking differently: Take 10 mg by mouth at bedtime as needed (allergies/asthma symptoms). 06/13/20  Yes Lyndal Pulley, DO  rizatriptan (MAXALT) 5 MG tablet Take 1 tablet (5 mg total) by mouth as needed for migraine. May repeat in 2 hours if needed 12/26/20  Yes Vivi Barrack, MD  azaTHIOprine (IMURAN) 50 MG tablet Take 2 tablets (100 mg total) by mouth daily. Patient not taking: Reported on 02/28/2021 02/20/21   Gatha Mayer, MD  HUMIRA PEN 79 MG/0.8ML PNKT INJECT 1 PEN UNDER THE SKIN EVERY 14 DAYS 02/28/21   Gatha Mayer, MD    Current Facility-Administered Medications  Medication Dose  Route Frequency Provider Last Rate Last Admin   lactated ringers infusion   Intravenous Continuous Lacretia Leigh, MD 200 mL/hr at 02/28/21 1239 New Bag at 02/28/21 1239   morphine 2 MG/ML injection 2 mg  2 mg Intravenous Q2H PRN Tacey Ruiz, MD   2 mg at 02/28/21 1236    Allergies as of 02/27/2021 - Review Complete 02/27/2021  Allergen Reaction Noted   Mercaptopurine Nausea And Vomiting 02/17/2014    Family History  Problem Relation Age of Onset   Ulcerative colitis Mother    Colon cancer Paternal Grandmother    Brain cancer Paternal Grandmother        mets from colon    Social History   Socioeconomic History   Marital status: Divorced    Spouse name: Not on file   Number of children: 2   Years  of education: 16 at least   Highest education level: Not on file  Occupational History   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   Married to Dr. 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  All other review of systems was otherwise negative.   Physical  Exam: Vital signs in last 24 hours: Temp:  [98.5 F (36.9 C)-101.8 F (38.8 C)] 98.5 F (36.9 C) (07/13 1134) Pulse Rate:  [9-121] 77 (07/13 1134) Resp:  [13-25] 17 (07/13 1134) BP: (100-136)/(63-91) 129/66 (07/13 1134) SpO2:  [80 %-100 %] 99 % (07/13 1134) Weight:  [68 kg] 68 kg (07/12 1831)   General:   Alert,  Well-developed, well-nourished, acutely ill-appearing white male pleasant and cooperative in NAD ,temp 102.3 Head:  Normocephalic and atraumatic. Eyes:  Sclera slightly erythematous no icterus.   Conjunctiva pink. Ears:  Normal auditory acuity. Nose:  No deformity, discharge,  or lesions. Mouth:  No deformity or lesions.  Mucosa somewhat dry Neck:  Supple; no masses or thyromegaly. Lungs:  Clear throughout to auscultation.   No wheezes, crackles, or rhonchi.  Heart:  Regular rate and rhythm; no murmurs, clicks, rubs,  or gallops. Abdomen:  Soft, mildly tender across the upper and mid abdomen ,no guarding or rebound, BS active,nonpalp mass or hsm.   Rectal:  Deferred  Msk: Warmth and erythema around both of the knees and ankles Pulses:  Normal pulses noted. Extremities:  Without clubbing or edema. Neurologic:  Alert and  oriented x4;  grossly normal neurologically. Skin: Macular erythematous rash bilateral knees and lower extremities and over the back Psych:  Alert and cooperative. Normal mood and affect.  Intake/Output from previous day: 07/12 0701 - 07/13 0700 In: -  Out: 900 [Urine:900] Intake/Output this shift: No intake/output data recorded.  Lab Results: Recent Labs    02/27/21 1852  WBC 10.5  HGB 15.1  HCT 43.0  PLT 181   BMET Recent Labs    02/27/21 1852  NA 137  K 4.3  CL 101  CO2 27  GLUCOSE 99  BUN 12  CREATININE 0.85  CALCIUM 9.4   LFT Recent Labs    02/27/21 1852  PROT 7.3  ALBUMIN 4.2  AST 33  ALT 22  ALKPHOS 66  BILITOT 1.8*   PT/INR No results for input(s): LABPROT, INR in the last 72 hours. Hepatitis Panel No results  for input(s): HEPBSAG,  HCVAB, [place], HEPBIGM in the last 72 hours.   IMPRESSION:   #53 50 year old white male with long history of Crohn's ileocolitis status post remote total colectomy with history of mild pouchitis.  Patient has been maintained on Humira and has had no recent evidence of active disease  Humira antibody level + March 2022, mild elevation  Started on azathioprine 100 mg/day 9 days ago.  Patient with onset of acute illness 6 days ago, progressive with generalized weakness, malaise, hot and cold spells, flushing mild abdominal pain, headache nausea and severe arthralgias which have now settled into the knees and ankles primarily.  New development of rash over the past 24 hours Patient reduce the dose and skipped a dose for day of azathioprine, last dose of 50 mg yesterday morning.  CT imaging shows no evidence of acute pancreatitis though lipase elevated at 400 Other labs are reassuring  Picture is most consistent with a serum sickness type illness/drug hypersensitivity reaction  #2 GERD #3 history of lumbar disc disease #4 prior history of interstitial lung disease #5 prior history of small bowel obstruction  Plan; stop azathioprine permanently Full liquid diet Liberal hydration Solu-Medrol 80 mg IV today, may repeat in a.m. Will hold on Toradol for now due to history of pouchitis Change analgesics to Dilaudid Antiemetics as needed Follow-up blood cultures Hope to see quick resolution of symptoms once clears azathioprine    Amy Esterwood PA-C 02/28/2021, 12:59 PM  I have seen and evaluated the patient as well.  He is having an acute reaction to the azathioprine with elevated lipase he may have some nonradiographic pancreatitis but he is having a serum sickness-like issue.  We will treat him with steroids and analgesics and hydration.  We will follow.  I appreciate the opportunity to participate in his care and I appreciate [facility] hospitalist's care.   Gatha Mayer, MD, [facility] 02/28/2021 5:41 PM

## 2021-02-28 NOTE — H&P (Signed)
History and Physical    Charles [name] JOA:[id number] DOB: [birth year] DOA: 02/27/2021  PCP: Vivi Barrack, MD  Patient coming from: Home to [place]  I have personally briefly reviewed patient's old medical records in [facility].  Chief Complaint: aches and fever  HPI: Charles [name] is a 50 y.o. male with medical history significant for Crohn's disease with multiple surgeries who presents to the [facility] emergency department on 02/27/21 with fever, abdominal pain, and generalized aches. He thinks he may be having a reaction to his new medication, azathioprine. Onset of patient's symptoms was sudden on 02/24/21 and duration is constantly present but variable in severity. He had eaten a hamburger and later on that day he developed generalized aches and pains that were severe. He first had neck, shoulder pain and headache, then developed knee, ankle, and low back pain bilaterally. He decreased the dose and intermittently stopped the azathioprine; lower doses resulted in fewer symptoms but he still had symptoms. On 02/27/21 he restarted the azathioprine and had severe symptoms. He did have a sensation of his throat closing at one point and his upper lip was starting to feel tingling and was swelling. He did not have any tongue swelling or oral lesions. At one point he did develop substernal chest tightness 4/10 that did not radiate and was associated with shortness of breath; the chest pain and shortness of breath resolved. He had severe chills but did not have a fever until he came to the emergency department. Patient has chronic diarrhea. He did have abdominal pain that was generalized; he had nausea but no vomiting. He noticed his skin was developing a blotchy red rash and the areas with rash were also painful; lesions were all over on his legs.Symptoms are alleviated by nothing and exacerbated by nothing.  ED Course:  Lipase was 400. CT abdomen showed no acute findings.Patient had a fever. He was  given IV methylprednisolone and IV morphine. After receiving the medications he had some improvement in his symptoms.  Review of Systems: As per HPI otherwise all other systems reviewed and are unremarable.  GENITOURINARY: No dysuria or hematuria.  .  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  with pouch ulcer.    FUNCTIONAL ENDOSCOPIC SINUS SURGERY  12/2003   partial ethmoidectomy.  Dr Wilburn Cornelia   ileoanal pull-through     LUMBAR LAMINECTOMY/DECOMPRESSION MICRODISCECTOMY Right 10/13/2015   Procedure: Right Lumbar four- five Microdiskectomy;  Surgeon: Erline Levine, MD;  Location: [place] [facility];  Service: Neurosurgery;  Laterality: Right;  Right L4-5 Microdiskectomy  sacroilitis     SIGMOIDOSCOPY  01/2005   VENTRAL HERNIA REPAIR  06/2014    Social History  reports that he has never smoked. He has never used smokeless tobacco. He reports current alcohol use of about 14.0 standard drinks of alcohol per week. He reports that he does not use drugs.  Allergies  Allergen Reactions   Azathioprine Other (See Comments)    Serum sickness / acute hypersensitivity reaction with possible Acute pancreatitis    Mercaptopurine Nausea And Vomiting    Felt ill within 2 days of initiating therapy.    Family History  Problem Relation Age of Onset   Ulcerative colitis Mother    Berenice Primas' disease Mother    Colon cancer Paternal Grandmother    Brain cancer Paternal Grandmother        mets from colon     Home Medications  Prior to Admission medications   Medication Sig Start Date End Date Taking? Authorizing Provider  Ascorbic Acid (VITAMIN C PO) Take 1 tablet by mouth daily.   Yes [provider]  budesonide-formoterol (SYMBICORT) 160-4.5 MCG/ACT inhaler INHALE 1 PUFF INTO THE LUNGS TWICE DAILY Patient taking differently: Inhale 2 puffs into the lungs 2 (two) times daily as needed (shortness of breath, wheezing). 01/20/20  Yes Vivi Barrack, MD  celecoxib (CELEBREX) 100 MG capsule TAKE 1 CAPSULE BY MOUTH 2 TIMES DAILY AS NEEDED Patient taking differently: Take 100 mg by mouth 2 (two) times daily as needed for mild pain or moderate pain. 04/12/20  Yes Gatha Mayer, MD  cholecalciferol (VITAMIN D) 1000 units tablet Take 1,000 Units by mouth daily.   Yes [provider]  Cyanocobalamin (VITAMIN B-12 PO) Take 1 tablet by mouth daily.   Yes [provider]  HUMIRA PEN 34 MG/0.8ML PNKT INJECT ONE PEN SUBCUTANEOUSLY EVERY OTHER WEEK. REFRIGERATE. Patient taking differently: Inject 1 pen into the skin every 14 (fourteen) days. 06/26/20  Yes Gatha Mayer, MD  montelukast (SINGULAIR) 10 MG tablet Take 1 tablet (10 mg total) by mouth at bedtime. Patient taking differently: Take 10 mg by mouth at bedtime as needed (allergies/asthma symptoms). 06/13/20  Yes Lyndal Pulley, DO  rizatriptan (MAXALT) 5 MG tablet Take 1 tablet (5 mg total) by mouth as needed for migraine. May repeat in 2 hours if needed 12/26/20  Yes Vivi Barrack, MD  azaTHIOprine (IMURAN) 50 MG tablet Take 2 tablets (100 mg total) by mouth daily. Patient not taking: Reported on 02/28/2021 02/20/21   Gatha Mayer,  MD    Physical Exam: Vitals:   02/28/21 1000 02/28/21 1015 02/28/21 1031 02/28/21 1134  BP: 114/75   129/66  Pulse: 84 84  77  Resp: 17 13  17   Temp:   99 F (37.2 C) 98.5 F (36.9 C)  TempSrc:   Oral Oral  SpO2: 93% 98%  99%  Weight:      Height:        Constitutional: NAD, calm, comfortable, ill-appearing. Vitals:   02/28/21 1000 02/28/21 1015 02/28/21 1031 02/28/21 1134  BP: 114/75   129/66  Pulse: 84 84  77  Resp: 17 13  17   Temp:   99 F (37.2 C) 98.5 F (36.9 C)  TempSrc:   Oral Oral  SpO2: 93% 98%  99%  Weight:      Height:       Eyes: Pupils equal and round, lids and conjunctivae without icterus or erythema. ENMT: Mucous membranes are dry. Posterior pharynx clear of  any exudate or lesions. Nares patent without discharge or bleeding.  Normocephalic, atraumatic.  Normal dentition.  Neck: normal, supple, no masses, trachea midline.  Thyroid nontender, no masses appreciated, no thyromegaly. Respiratory: clear to auscultation bilaterally. Chest wall movements are symmetric. No wheezing, no crackles.  No rhonchi.  Normal respiratory effort. No accessory muscle use.  Cardiovascular: Regular rate and rhythm, no murmurs / rubs / gallops. Pulses: DP pulses 2+ bilaterally. No carotid bruits.  Capillary refill less than 3 seconds. Edema: None bilaterally. GI: soft, non-distended, normal active bowel sounds. No hepatosplenomegaly. No rigidity, rebound, or guarding. Non-tender. No masses palpated.  Musculoskeletal: no clubbing / cyanosis. No joint deformity upper and lower extremities. Good ROM, no contractures. Normal muscle tone.  No tenderness or deformity in the back bilaterally. No tenderness in the shoulders bilaterally. Generalized tenderness in the knees and ankles bilaterally. Minimal swelling in knees bilaterally. Integument: no ulcers. No induration. Clean, dry, intact. Scattered irregular patches of erythema especially on legs bilaterally. Neurologic: CN 2-12 grossly  intact. Sensation grossly intact to light touch. DTR 2+ bilaterally.  Babinski: Toes nonreactive bilaterally.  Strength 5/5 in all 4.  Intact rapid alternating movements bilaterally.  No pronator drift. Psychiatric: Normal judgment and insight. Alert and oriented x 3. Normal mood.  Normal and appropriate affect. Lymphatic: No cervical lymphadenopathy. No supraclavicular lymphadenopathy.   Labs on Admission: I have personally reviewed the following labs and imaging studies.  CBC: Recent Labs  Lab 02/27/21 1852  WBC 10.5  NEUTROABS 9.8*  HGB 15.1  HCT 43.0  MCV 90.3  PLT 097    Basic Metabolic Panel: Recent Labs  Lab 02/27/21 1852  NA 137  K 4.3  CL 101  CO2 27  GLUCOSE 99  BUN 12  CREATININE 0.85  CALCIUM 9.4    GFR: Estimated Creatinine Clearance: 102.2 mL/min (by C-G formula based on SCr of 0.85 mg/dL).  Liver Function Tests: Recent Labs  Lab 02/27/21 1852  AST 33  ALT 22  ALKPHOS 66  BILITOT 1.8*  PROT 7.3  ALBUMIN 4.2    Urine analysis:    Component Value Date/Time   COLORURINE YELLOW 02/27/2021 [place] 02/27/2021 1851   LABSPEC 1.021 02/27/2021 1851   PHURINE 5.5 02/27/2021 1851   GLUCOSEU NEGATIVE 02/27/2021 1851   HGBUR NEGATIVE 02/27/2021 [place] 02/27/2021 1851   BILIRUBINUR neg 04/30/2011 1704   KETONESUR 15 (A) 02/27/2021 1851   PROTEINUR NEGATIVE 02/27/2021 1851   UROBILINOGEN 0.2 02/09/2015 1321   NITRITE NEGATIVE 02/27/2021 1851   LEUKOCYTESUR NEGATIVE 02/27/2021 1851    Radiological Exams on Admission: CT Abdomen Pelvis W Contrast  Result Date: 02/27/2021 CLINICAL DATA:  Pt thinks he may be having a hypersensitivity reaction. Azathioprine. Pt is having low back, knees and ankle pain, headache, chills, chest tightness, upper lip numbness, sore throat. Fever. COVID negative. History of Crohn's disease. EXAM: CT ABDOMEN AND PELVIS WITH CONTRAST TECHNIQUE: Multidetector CT imaging of the abdomen and  pelvis was performed using the standard protocol following bolus administration of intravenous contrast. CONTRAST:  153m OMNIPAQUE IOHEXOL 300 MG/ML  SOLN COMPARISON:  CT 02/09/2015 FINDINGS: Lower chest: Lung bases are clear. Hepatobiliary: No focal hepatic lesion. No biliary duct dilatation. Common bile duct is normal. Pancreas: Pancreas is normal. No ductal dilatation. No pancreatic inflammation. Spleen: Normal spleen Adrenals/urinary tract: Adrenal glands and kidneys are normal. The ureters and bladder normal. Stomach/Bowel: Stomach duodenum small bowel normal. No small bowel stricturing or inflammation. No small  bowel dilatation. 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 No inflammation associated with the ileal pouch. Vascular/Lymphatic: Abdominal is normal caliber. Several small periaortic lymph nodes similar to remote CT scan. Reproductive: Prostate unremarkable. Other: No free-fluid.  No organized fluid collections. Musculoskeletal: No aggressive osseous lesion. IMPRESSION: 1. No acute findings in the abdomen pelvis. No abscess or fluid collections 2. No small bowel inflammation. Colectomy with ileo anal anastomosis without complication or inflammation. Electronically Signed   By: Suzy Bouchard M.D.   On: 02/27/2021 20:59    EKG: Independently reviewed. 101 bpm. Sinus tachycardia. QTc prolonged 528.  Assessment/Plan Principal Problem:   Serum sickness due to drug Symptoms are likely due to serum sickness/hypersensitivity reaction to his new medication, azathioprine.  Plan: Stop azathioprine and add to allergy list (completed). IV methylprednisolone. IV fluids. IV morphine prn. Monitor symptoms.   Active Problems:    Acute pancreatitis Possible diagnosis. Lipase elevated but no evidence on CT. Azathioprine could have been causing onset of pancreatitis. Plan: IV fluids. Will allow clear liquids initially.    Adverse effect of azathioprine   Plan: Stop azathioprine and add to allergy list (completed).    Crohn's disease ([place]) Plan: Appreciate GI consult. Stop azathioprine. GI to determine long-term treatment plan.   Prolonged QT on EKG   Likely temporary. Plan: Check magnesium and replace if needed. Recheck EKG tomorrow.    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    DVT prophylaxis: Lovenox.  Code Status:   Full Code   Disposition Plan:   Patient is from:  Home  Anticipated DC to:  Home  Anticipated DC date:  03/04/21  Anticipated DC barriers: level of pain, recurrence of symptoms  Consults called:  GI:  Gatha Mayer Admission status:  Inpatient   Severity of Illness: The appropriate patient status for this patient is INPATIENT. Inpatient status is judged to be reasonable and necessary in order to provide the required intensity of service to ensure the patient's safety. The patient's presenting symptoms, physical exam findings, and initial radiographic and laboratory data in the context of their chronic comorbidities is felt to place them at high risk for further clinical deterioration. Furthermore, it is not anticipated that the patient will be medically stable for discharge from the hospital within 2 midnights of admission. The following factors support the patient status of inpatient.   " The patient's presenting symptoms include severe pain, generalized. Also rash, sensation of throat closing. " The worrisome physical exam findings include tenderness in multiple joints, rash.. 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   * I certify that at the point of admission it is my clinical judgment that the patient will require inpatient hospital care spanning beyond 2 midnights from the point of admission due to high intensity of service, high risk for further deterioration and high frequency of surveillance  required.Tacey Ruiz MD [facility]  How to contact the [facility] Attending or Consulting provider [place] or covering provider during after hours [place], for this patient?   Check the care team in [facility] and look for a) attending/consulting [facility] provider listed and b) the [facility] team listed Log into [email or web address] and use [place]'s universal password to access. If you do not have the password, please contact the hospital operator. Locate the [facility] provider you are looking for under [facility] and page to a number that you  can be directly reached. If you still have difficulty reaching the provider, please page the [facility] (Director on Call) for the Hospitalists listed on amion for assistance.  02/28/2021, 11:56 AM

## 2021-02-28 NOTE — ED Notes (Signed)
Pt reports that he does not want [facility] transport him to [facility]. Pt's girlfriend is going to transport him.

## 2021-02-28 NOTE — Plan of Care (Signed)

## 2021-03-01 ENCOUNTER — Encounter (HOSPITAL_COMMUNITY): Payer: Self-pay | Admitting: Internal Medicine

## 2021-03-01 DIAGNOSIS — T8069XA Other serum reaction due to other serum, initial encounter: Secondary | ICD-10-CM

## 2021-03-01 DIAGNOSIS — K859 Acute pancreatitis without necrosis or infection, unspecified: Secondary | ICD-10-CM

## 2021-03-01 DIAGNOSIS — R109 Unspecified abdominal pain: Secondary | ICD-10-CM | POA: Diagnosis present

## 2021-03-01 LAB — COMPREHENSIVE METABOLIC PANEL
ALT: 29 U/L (ref 0–44)
AST: 25 U/L (ref 15–41)
Albumin: 3.4 g/dL — ABNORMAL LOW (ref 3.5–5.0)
Alkaline Phosphatase: 60 U/L (ref 38–126)
Anion gap: 6 (ref 5–15)
BUN: 11 mg/dL (ref 6–20)
CO2: 28 mmol/L (ref 22–32)
Calcium: 9 mg/dL (ref 8.9–10.3)
Chloride: 104 mmol/L (ref 98–111)
Creatinine, Ser: 0.83 mg/dL (ref 0.61–1.24)
GFR, Estimated: >60 mL/min (ref >60)
Glucose, Bld: 120 mg/dL — ABNORMAL HIGH (ref 70–99)
Potassium: 4.2 mmol/L (ref 3.5–5.1)
Sodium: 138 mmol/L (ref 135–145)
Total Bilirubin: 1.4 mg/dL — ABNORMAL HIGH (ref 0.3–1.2)
Total Protein: 6.5 g/dL (ref 6.5–8.1)

## 2021-03-01 LAB — CBC
HCT: 38.8 % — ABNORMAL LOW (ref 39.0–52.0)
Hemoglobin: 13.4 g/dL (ref 13.0–17.0)
MCH: 32 pg (ref 26.0–34.0)
MCHC: 34.5 g/dL (ref 30.0–36.0)
MCV: 92.6 fL (ref 80.0–100.0)
Platelets: 175 10*3/uL (ref 150–400)
RBC: 4.19 MIL/uL — ABNORMAL LOW (ref 4.22–5.81)
RDW: 12.5 % (ref 11.5–15.5)
WBC: 6.9 10*3/uL (ref 4.0–10.5)
nRBC: 0 % (ref 0.0–0.2)

## 2021-03-01 LAB — AMYLASE: Amylase: 64 U/L (ref 28–100)

## 2021-03-01 LAB — LIPASE, BLOOD: Lipase: 51 U/L (ref 11–51)

## 2021-03-01 LAB — MAGNESIUM: Magnesium: 2.1 mg/dL (ref 1.7–2.4)

## 2021-03-01 MED ORDER — DIPHENHYDRAMINE HCL 50 MG/ML IJ SOLN
12.5000 mg | Freq: Once | INTRAMUSCULAR | Status: AC
  Administered 2021-03-01: 12.5 mg via INTRAVENOUS
  Filled 2021-03-01: qty 1

## 2021-03-01 MED ORDER — ONDANSETRON HCL 4 MG PO TABS: 4.0000 mg | ORAL_TABLET | Freq: Four times a day (QID) | ORAL | 0 refills | Status: DC | PRN

## 2021-03-01 MED ORDER — PREDNISONE 10 MG PO TABS: ORAL_TABLET | ORAL | 0 refills | Status: DC

## 2021-03-01 MED ORDER — BUDESONIDE-FORMOTEROL FUMARATE 160-4.5 MCG/ACT IN AERO: 2.0000 | INHALATION_SPRAY | Freq: Two times a day (BID) | RESPIRATORY_TRACT | Status: DC | PRN

## 2021-03-01 NOTE — Progress Notes (Addendum)
Patient ID: Charles [name], male   DOB: [birth year], 50 y.o.   MRN: [id number]    Progress Note   Subjective   Day # 2  CC; history of Crohn's disease, acute illness with headache severe arthralgias, fever rash weakness T-max 102.3 WBC 6.9/hemoglobin 13.4/platelets 175 T bili 1.4 LFTs within normal limits, lipase 51  Patient feels significantly better today, was able to walk in the hall significantly less joint pain, asking for regular food, no nausea or vomiting, no current abdominal pain headache has resolved.  He still has the rash and has some associated pruritus. Hoping to go home today    Objective   Vital signs in last 24 hours: Temp:  [97.7 F (36.5 C)-99 F (37.2 C)] 97.7 F (36.5 C) (07/14 0453) Pulse Rate:  [62-104] 62 (07/14 0453) Resp:  [13-20] 20 (07/14 0453) BP: (109-129)/(66-87) 109/78 (07/14 0453) SpO2:  [93 %-100 %] 96 % (07/14 0825) Weight:  [71.5 kg] 71.5 kg (07/14 0500) Last BM Date: 02/28/21 General:    White male in NAD Heart:  Regular rate and rhythm; no murmurs Lungs: Respirations even and unlabored, lungs CTA bilaterally Abdomen:  Soft, nontender and nondistended. Normal bowel sounds. Extremities: Macular erythematous rash lower extremities, and back, no hives Neurologic:  Alert and oriented,  grossly normal neurologically. Psych:  Cooperative. Normal mood and affect.  Intake/Output from previous day: 07/13 0701 - 07/14 0700 In: 2270 [P.O.:270; I.V.:2000] Out: 900 [Urine:900] Intake/Output this shift: No intake/output data recorded.  Lab Results: Recent Labs    02/27/21 1852 03/01/21 0518  WBC 10.5 6.9  HGB 15.1 13.4  HCT 43.0 38.8*  PLT 181 175   BMET Recent Labs    02/27/21 1852 03/01/21 0518  NA 137 138  K 4.3 4.2  CL 101 104  CO2 27 28  GLUCOSE 99 120*  BUN 12 11  CREATININE 0.85 0.83  CALCIUM 9.4 9.0   LFT Recent Labs    03/01/21 0518  PROT 6.5  ALBUMIN 3.4*  AST 25  ALT 29  ALKPHOS 60  BILITOT 1.4*   PT/INR No  results for input(s): LABPROT, INR in the last 72 hours.  Studies/Results: CT Abdomen Pelvis W Contrast  Result Date: 02/27/2021 CLINICAL DATA:  Pt thinks he may be having a hypersensitivity reaction. Azathioprine. Pt is having low back, knees and ankle pain, headache, chills, chest tightness, upper lip numbness, sore throat. Fever. COVID negative. History of Crohn's disease. EXAM: CT ABDOMEN AND PELVIS WITH CONTRAST TECHNIQUE: Multidetector CT imaging of the abdomen and pelvis was performed using the standard protocol following bolus administration of intravenous contrast. CONTRAST:  126m OMNIPAQUE IOHEXOL 300 MG/ML  SOLN COMPARISON:  CT 02/09/2015 FINDINGS: Lower chest: Lung bases are clear. Hepatobiliary: No focal hepatic lesion. No biliary duct dilatation. Common bile duct is normal. Pancreas: Pancreas is normal. No ductal dilatation. No pancreatic inflammation. Spleen: Normal spleen Adrenals/urinary tract: Adrenal glands and kidneys are normal. The ureters and bladder normal. Stomach/Bowel: Stomach duodenum small bowel normal. No small bowel stricturing or inflammation. No small bowel dilatation. 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 No inflammation associated with the ileal pouch. Vascular/Lymphatic: Abdominal is normal caliber. Several small periaortic lymph nodes similar to remote CT scan. Reproductive: Prostate unremarkable. Other: No free-fluid.  No organized fluid collections. Musculoskeletal: No aggressive osseous lesion. IMPRESSION: 1. No acute findings in the abdomen pelvis. No abscess or fluid collections 2. No small bowel inflammation. Colectomy with ileo anal anastomosis without complication or inflammation.  Electronically Signed   By: Suzy Bouchard M.D.   On: 02/27/2021 20:59       Assessment / Plan:    #88 50 year old white male with long history of Crohn's ileocolitis status post remote total colectomy, history of mild pouchitis.  Patient has  been maintained on Humira with no recent evidence of active disease  Low Humira antibody level has developed as of March 2022 Decision made to initiate azathioprine, started about 10 days ago  Onset of acute illness 6 to 7 days ago with generalized weakness, malaise, hot and cold spells mild abdominal pain, headache nausea and severe arthralgias.  Developed rash about 24 hours prior to admission  Lipase elevated on admission however CT scan does not show any evidence of acute pancreatitis and no other acute abnormalities in the abdomen  Picture is consistent with a serum sickness illness/acute drug hypersensitivity reaction to azathioprine  He is much improved today since discontinuation of azathioprine and with IV Solu-Medrol  #2 GERD stable #3 prior history of interstitial lung disease #4 prior history of small bowel obstruction  Plan; regular diet Solu-Medrol 80 IV to be given this a.m. I think he can resume Celebrex for arthralgias as an outpatient Encouraged him to not take any more Dilaudid so can get an idea of how much residual joint pain he is having I think he would benefit from a short tapered course of prednisone on discharge 60 mg tomorrow, then taper by 10 mg/day If continues to do well should be okay to discharge home this afternoon  Next Humira dose due next Friday.  Decisions regarding continuing Humira versus switching to another biologic can be made as an outpatient per Dr. Carlean Purl.   Principal Problem:   Serum sickness due to drug Active Problems:   Rosanna Randy syndrome   Crohn's disease ([place])   Acute pancreatitis   Adverse effect of azathioprine     LOS: 1 day   Amy Esterwood PA-C 03/01/2021, 8:48 AM    Agree with Ms. Genia Harold assessment and plan. Gatha Mayer, MD, [facility]

## 2021-03-01 NOTE — Progress Notes (Signed)
The patient stated he did not need or want the MDI at this time

## 2021-03-01 NOTE — Discharge Summary (Signed)
Physician Discharge Summary  Charles [name] IWL:[id number] DOB: July 29, 1971 DOA: 02/27/2021  PCP: Vivi Barrack, MD  Admit date: 02/27/2021 Discharge date: 03/01/2021  Admitted From: Home Disposition: Home  Recommendations for Outpatient Follow-up:  Follow up with PCP in 1 week with repeat CBC/BMP Outpatient follow-up with gastroenterology Follow up in ED if symptoms worsen or new appear   Home Health: No Equipment/Devices: None  Discharge Condition: Stable CODE STATUS: Full Diet recommendation: Regular  Brief/Interim Summary: 50 y.o. male with medical history significant for Crohn's disease with multiple surgeries presented with aches, arthralgias and fever after a possible reaction to his new medication, azathioprine.  On presentation, lipase was 400.  CT of the abdomen showed no acute findings.  He was given IV Solu-Medrol.  GI was consulted.  During the hospitalizing, his symptoms have improved.  Diet has been advanced and he is tolerating it.  Azathioprine has been discontinued.  GI has cleared the patient for discharge on tapering doses of oral prednisone with outpatient follow-up with GI.  Discharge patient home today.  Discharge Diagnoses:   Possible serum sickness due to azathioprine Possible acute pancreatitis History of Crohn's disease -Presented with aches, arthralgias, fever, abdominal pain; most likely secondary to reaction to azathioprine.  Azathioprine has been stopped and added to allergy list -Treated with IV Solu-Medrol, IV fluids and pain medications.  CT of the abdomen showed no signs of pancreatitis -Symptoms have much improved.  GI has advanced his diet and he has tolerated.  He will be discharged home today on prednisone 60 mg daily for 1 day to be tapered 10 mg/day till he stops it.  Outpatient follow-up with GI.  Continue to monitor.  Gilbert's syndrome -Outpatient follow-up  Discharge Instructions  Discharge Instructions     Ambulatory referral to  Gastroenterology   Complete by: As directed    Diet general   Complete by: As directed    Increase activity slowly   Complete by: As directed       Allergies as of 03/01/2021       Reactions   Azathioprine Other (See Comments)   Serum sickness / acute hypersensitivity reaction with possible Acute pancreatitis   Mercaptopurine Nausea And Vomiting   Felt ill within 2 days of initiating therapy.        Medication List     STOP taking these medications    azaTHIOprine 50 MG tablet Commonly known as: IMURAN       TAKE these medications    budesonide-formoterol 160-4.5 MCG/ACT inhaler Commonly known as: Symbicort Inhale 2 puffs into the lungs 2 (two) times daily as needed (shortness of breath, wheezing).   celecoxib 100 MG capsule Commonly known as: CELEBREX TAKE 1 CAPSULE BY MOUTH 2 TIMES DAILY AS NEEDED What changed: See the new instructions.   cholecalciferol 1000 units tablet Commonly known as: VITAMIN D Take 1,000 Units by mouth daily.   Humira Pen 40 MG/0.8ML Pnkt Generic drug: Adalimumab INJECT 1 PEN UNDER THE SKIN EVERY 14 DAYS What changed: See the new instructions.   montelukast 10 MG tablet Commonly known as: SINGULAIR Take 1 tablet (10 mg total) by mouth at bedtime. What changed:  when to take this reasons to take this   ondansetron 4 MG tablet Commonly known as: ZOFRAN Take 1 tablet (4 mg total) by mouth every 6 (six) hours as needed for nausea.   predniSONE 10 MG tablet Commonly known as: DELTASONE 60 mg daily for 1 day then 50 mg daily for 1 day  then 40 mg daily for 1 day then 30 mg a day for 1 day then 20 mg daily for 1 day then 10 mg daily for 1 day then stop   rizatriptan 5 MG tablet Commonly known as: MAXALT Take 1 tablet (5 mg total) by mouth as needed for migraine. May repeat in 2 hours if needed   VITAMIN B-12 PO Take 1 tablet by mouth daily.   VITAMIN C PO Take 1 tablet by mouth daily.        Allergies  Allergen  Reactions   Azathioprine Other (See Comments)    Serum sickness / acute hypersensitivity reaction with possible Acute pancreatitis   Mercaptopurine Nausea And Vomiting    Felt ill within 2 days of initiating therapy.    Consultations: GI   Procedures/Studies: CT Abdomen Pelvis W Contrast  Result Date: 02/27/2021 CLINICAL DATA:  Pt thinks he may be having a hypersensitivity reaction. Azathioprine. Pt is having low back, knees and ankle pain, headache, chills, chest tightness, upper lip numbness, sore throat. Fever. COVID negative. History of Crohn's disease. EXAM: CT ABDOMEN AND PELVIS WITH CONTRAST TECHNIQUE: Multidetector CT imaging of the abdomen and pelvis was performed using the standard protocol following bolus administration of intravenous contrast. CONTRAST:  175m OMNIPAQUE IOHEXOL 300 MG/ML  SOLN COMPARISON:  CT 02/09/2015 FINDINGS: Lower chest: Lung bases are clear. Hepatobiliary: No focal hepatic lesion. No biliary duct dilatation. Common bile duct is normal. Pancreas: Pancreas is normal. No ductal dilatation. No pancreatic inflammation. Spleen: Normal spleen Adrenals/urinary tract: Adrenal glands and kidneys are normal. The ureters and bladder normal. Stomach/Bowel: Stomach duodenum small bowel normal. No small bowel stricturing or inflammation. No small bowel dilatation. 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 No inflammation associated with the ileal pouch. Vascular/Lymphatic: Abdominal is normal caliber. Several small periaortic lymph nodes similar to remote CT scan. Reproductive: Prostate unremarkable. Other: No free-fluid.  No organized fluid collections. Musculoskeletal: No aggressive osseous lesion. IMPRESSION: 1. No acute findings in the abdomen pelvis. No abscess or fluid collections 2. No small bowel inflammation. Colectomy with ileo anal anastomosis without complication or inflammation. Electronically Signed   By: SSuzy BouchardM.D.   On:  02/27/2021 20:59      Subjective: Patient seen and examined at bedside.  He feels much better and thinks that he can go home today.  Abdominal pain is improving.  Joint pains are improving.  Body rash is also improving.  No overnight fever or chest pain reported.  Discharge Exam: Vitals:   03/01/21 0453 03/01/21 0825  BP: 109/78   Pulse: 62   Resp: 20   Temp: 97.7 F (36.5 C)   SpO2: 96% 96%    General: Pt is alert, awake, not in acute distress.  On room air. Cardiovascular: rate controlled, S1/S2 + Respiratory: bilateral decreased breath sounds at bases Abdominal: Soft, NT, ND, bowel sounds + Extremities: no edema, no cyanosis Skin: Macular erythematous rash over lower extremities    The results of significant diagnostics from this hospitalization (including imaging, microbiology, ancillary and laboratory) are listed below for reference.     Microbiology: Recent Results (from the past 240 hour(s))  Resp Panel by RT-PCR (Flu A&B, Covid) Nasopharyngeal Swab     Status: None   Collection Time: 02/27/21  6:52 PM   Specimen: Nasopharyngeal Swab; Nasopharyngeal(NP) swabs in vial transport medium  Result Value Ref Range Status   SARS Coronavirus 2 by RT PCR NEGATIVE NEGATIVE Final    Comment: (  NOTE) SARS-CoV-2 target nucleic acids are NOT DETECTED.  The SARS-CoV-2 RNA is generally detectable in upper respiratory specimens during the acute phase of infection. The lowest concentration of SARS-CoV-2 viral copies this assay can detect is 138 copies/mL. A negative result does not preclude SARS-Cov-2 infection and should not be used as the sole basis for treatment or other patient management decisions. A negative result may occur with  improper specimen collection/handling, submission of specimen other than nasopharyngeal swab, presence of viral mutation(s) within the areas targeted by this assay, and inadequate number of viral copies(<138 copies/mL). A negative result must be  combined with clinical observations, patient history, and epidemiological information. The expected result is Negative.  Fact Sheet for Patients:  EntrepreneurPulse.com.au  Fact Sheet for Healthcare Providers:  IncredibleEmployment.be  This test is no t yet approved or cleared by the [place] FDA and  has been authorized for detection and/or diagnosis of SARS-CoV-2 by FDA under an Emergency Use Authorization (EUA). This EUA will remain  in effect (meaning this test can be used) for the duration of the COVID-19 declaration under Section 564(b)(1) of the Act, 21 U.S.C.section 360bbb-3(b)(1), unless the authorization is terminated  or revoked sooner.       Influenza A by PCR NEGATIVE NEGATIVE Final   Influenza B by PCR NEGATIVE NEGATIVE Final    Comment: (NOTE) The Xpert Xpress SARS-CoV-2/FLU/RSV plus assay is intended as an aid in the diagnosis of influenza from Nasopharyngeal swab specimens and should not be used as a sole basis for treatment. Nasal washings and aspirates are unacceptable for Xpert Xpress SARS-CoV-2/FLU/RSV testing.  Fact Sheet for Patients: EntrepreneurPulse.com.au  Fact Sheet for Healthcare Providers: IncredibleEmployment.be  This test is not yet approved or cleared by the [place] FDA and has been authorized for detection and/or diagnosis of SARS-CoV-2 by FDA under an Emergency Use Authorization (EUA). This EUA will remain in effect (meaning this test can be used) for the duration of the COVID-19 declaration under Section 564(b)(1) of the Act, 21 U.S.C. section 360bbb-3(b)(1), unless the authorization is terminated or revoked.  Performed at [facility], [address] [place], [place] [address]   Culture, blood (routine x 2)     Status: None (Preliminary result)   Collection Time: 02/28/21  2:30 AM   Specimen: BLOOD  Result Value Ref Range Status    Specimen Description   Final    BLOOD BOTTLES DRAWN AEROBIC AND ANAEROBIC Performed at [facility], [address], [place], [place] [address]    Special Requests   Final    Blood Culture adequate volume Performed at [facility], [address] [place], [place] [address]    Culture   Final    NO GROWTH < 24 HOURS Performed at [facility], [place] [address] [place], [place] [address]    Report Status PENDING  Incomplete  Culture, blood (routine x 2)     Status: None (Preliminary result)   Collection Time: 02/28/21  2:30 AM   Specimen: BLOOD  Result Value Ref Range Status   Specimen Description   Final    BLOOD BOTTLES DRAWN AEROBIC AND ANAEROBIC Performed at [facility], [address], [place], [place] [address]    Special Requests   Final    Blood Culture adequate volume Performed at [facility], [address] [place], [place] [address]    Culture   Final    NO GROWTH < 24 HOURS Performed at [facility], [address], [place], [place] [address]    Report Status PENDING  Incomplete     Labs: BNP (last 3 results) No results for input(s): BNP in the last 8760 hours. Basic Metabolic Panel: Recent Labs  Lab 02/27/21 1852 03/01/21 0518  NA 137 138  K 4.3 4.2  CL 101 104  CO2 27 28  GLUCOSE 99 120*  BUN 12 11  CREATININE 0.85 0.83  CALCIUM 9.4 9.0  MG  --  2.1   Liver Function Tests: Recent Labs  Lab 02/27/21 1852 03/01/21 0518  AST 33 25  ALT 22 29  ALKPHOS 66 60  BILITOT 1.8* 1.4*  PROT 7.3 6.5  ALBUMIN 4.2 3.4*   Recent Labs  Lab 02/27/21 1852 03/01/21 0518  LIPASE 400* 51  AMYLASE  --  64   No results for input(s): AMMONIA in the last 168 hours. CBC: Recent Labs  Lab 02/27/21 1852 03/01/21 0518  WBC 10.5 6.9  NEUTROABS 9.8*  --   HGB 15.1 13.4  HCT 43.0 38.8*  MCV 90.3 92.6  PLT 181 175   Cardiac Enzymes: No results for input(s):  CKTOTAL, CKMB, CKMBINDEX, TROPONINI in the last 168 hours. BNP: Invalid input(s): POCBNP CBG: No results for input(s): GLUCAP in the last 168 hours. D-Dimer No results for input(s): DDIMER in the last 72 hours. Hgb A1c No results for input(s): HGBA1C in the last 72 hours. Lipid Profile No results for input(s): CHOL, HDL, LDLCALC, TRIG, CHOLHDL, LDLDIRECT in the last 72 hours. Thyroid function studies No results for input(s): TSH, T4TOTAL, T3FREE, THYROIDAB in the last 72 hours.  Invalid input(s): FREET3 Anemia work up No results for input(s): VITAMINB12, FOLATE, FERRITIN, TIBC, IRON, RETICCTPCT in the last 72 hours. Urinalysis    Component Value Date/Time   COLORURINE YELLOW 02/27/2021 1851   APPEARANCEUR CLEAR 02/27/2021 1851   LABSPEC 1.021 02/27/2021 1851   PHURINE 5.5 02/27/2021 1851   GLUCOSEU NEGATIVE 02/27/2021 1851   HGBUR NEGATIVE 02/27/2021 1851   BILIRUBINUR NEGATIVE 02/27/2021 1851   BILIRUBINUR neg 04/30/2011 1704   KETONESUR 15 (A) 02/27/2021 1851   PROTEINUR NEGATIVE 02/27/2021 1851   UROBILINOGEN 0.2 02/09/2015 1321   NITRITE NEGATIVE 02/27/2021 1851   LEUKOCYTESUR NEGATIVE 02/27/2021 1851   Sepsis Labs Invalid input(s): PROCALCITONIN,  WBC,  LACTICIDVEN Microbiology Recent Results (from the past 240 hour(s))  Resp Panel by RT-PCR (Flu A&B, Covid) Nasopharyngeal Swab     Status: None   Collection Time: 02/27/21  6:52 PM   Specimen: Nasopharyngeal Swab; Nasopharyngeal(NP) swabs in vial transport medium  Result Value Ref Range Status   SARS Coronavirus 2 by RT PCR NEGATIVE NEGATIVE Final    Comment: (NOTE) SARS-CoV-2 target nucleic acids are NOT DETECTED.  The SARS-CoV-2 RNA is generally detectable in upper respiratory specimens during the acute phase of infection. The lowest concentration of SARS-CoV-2 viral copies this assay can detect is 138 copies/mL. A negative result does not preclude SARS-Cov-2 infection and should not be used as the sole basis  for treatment or other patient management decisions. A negative result may occur with  improper specimen collection/handling, submission of specimen other than nasopharyngeal swab, presence of viral mutation(s) within the areas targeted by this assay, and inadequate number of viral copies(<138 copies/mL). A negative result must be combined with clinical observations, patient history, and epidemiological information. The expected result is Negative.  Fact Sheet for Patients:  EntrepreneurPulse.com.au  Fact Sheet for Healthcare Providers:  IncredibleEmployment.be  This test is no t yet  approved or cleared by the [place] and  has been authorized for detection and/or diagnosis of SARS-CoV-2 by FDA under an Emergency Use Authorization (EUA). This EUA will remain  in effect (meaning this test can be used) for the duration of the COVID-19 declaration under Section 564(b)(1) of the Act, 21 U.S.C.section 360bbb-3(b)(1), unless the authorization is terminated  or revoked sooner.       Influenza A by PCR NEGATIVE NEGATIVE Final   Influenza B by PCR NEGATIVE NEGATIVE Final    Comment: (NOTE) The Xpert Xpress SARS-CoV-2/FLU/RSV plus assay is intended as an aid in the diagnosis of influenza from Nasopharyngeal swab specimens and should not be used as a sole basis for treatment. Nasal washings and aspirates are unacceptable for Xpert Xpress SARS-CoV-2/FLU/RSV testing.  Fact Sheet for Patients: EntrepreneurPulse.com.au  Fact Sheet for Healthcare Providers: IncredibleEmployment.be  This test is not yet approved or cleared by the [place] FDA and has been authorized for detection and/or diagnosis of SARS-CoV-2 by FDA under an Emergency Use Authorization (EUA). This EUA will remain in effect (meaning this test can be used) for the duration of the COVID-19 declaration under Section 564(b)(1) of the Act, 21  U.S.C. section 360bbb-3(b)(1), unless the authorization is terminated or revoked.  Performed at [facility], [address] [place], [place] [address]   Culture, blood (routine x 2)     Status: None (Preliminary result)   Collection Time: 02/28/21  2:30 AM   Specimen: BLOOD  Result Value Ref Range Status   Specimen Description   Final    BLOOD BOTTLES DRAWN AEROBIC AND ANAEROBIC Performed at [facility], [address] [place], [place] [address]    Special Requests   Final    Blood Culture adequate volume Performed at [facility], [address], [place], [place] [address]    Culture   Final    NO GROWTH < 24 HOURS Performed at [facility], [place] [address] [place], [place] [address]    Report Status PENDING  Incomplete  Culture, blood (routine x 2)     Status: None (Preliminary result)   Collection Time: 02/28/21  2:30 AM   Specimen: BLOOD  Result Value Ref Range Status   Specimen Description   Final    BLOOD BOTTLES DRAWN AEROBIC AND ANAEROBIC Performed at [facility], [address], [place], [place] [address]    Special Requests   Final    Blood Culture adequate volume Performed at [facility], [address] [place], [place] [address]    Culture   Final    NO GROWTH < 24 HOURS Performed at [facility], [place] [address] [place],  [address]    Report Status PENDING  Incomplete     Time coordinating discharge: 35 minutes  SIGNED:   Aline August, MD  [facility] 03/01/2021, 12:53 PM

## 2021-03-02 ENCOUNTER — Other Ambulatory Visit: Payer: Self-pay

## 2021-03-02 ENCOUNTER — Encounter: Payer: Self-pay | Admitting: Family Medicine

## 2021-03-02 MED ORDER — DICLOFENAC SODIUM 2 % EX SOLN: 2.0000 g | Freq: Two times a day (BID) | CUTANEOUS | 3 refills | Status: DC

## 2021-03-05 LAB — CULTURE, BLOOD (ROUTINE X 2)
Culture: NO GROWTH
Culture: NO GROWTH
Special Requests: ADEQUATE
Special Requests: ADEQUATE

## 2021-03-18 ENCOUNTER — Other Ambulatory Visit: Payer: Self-pay | Admitting: Internal Medicine

## 2021-04-24 ENCOUNTER — Encounter: Payer: Self-pay | Admitting: Family Medicine

## 2021-04-25 ENCOUNTER — Telehealth: Payer: 59 | Admitting: Physician Assistant

## 2021-04-25 DIAGNOSIS — U071 COVID-19: Secondary | ICD-10-CM | POA: Diagnosis not present

## 2021-04-25 MED ORDER — NIRMATRELVIR/RITONAVIR (PAXLOVID)TABLET: 3.0000 | ORAL_TABLET | Freq: Two times a day (BID) | ORAL | 0 refills | Status: AC

## 2021-04-25 MED ORDER — ALBUTEROL SULFATE HFA 108 (90 BASE) MCG/ACT IN AERS: 2.0000 | INHALATION_SPRAY | Freq: Four times a day (QID) | RESPIRATORY_TRACT | 0 refills | Status: DC | PRN

## 2021-04-25 NOTE — Progress Notes (Signed)
Virtual Visit Consent   Charles [name], you are scheduled for a virtual visit with a [facility] provider today.     Just as with appointments in the office, your consent must be obtained to participate.  Your consent will be active for this visit and any virtual visit you may have with one of our providers in the next 365 days.     If you have a MyChart account, a copy of this consent can be sent to you electronically.  All virtual visits are billed to your insurance company just like a traditional visit in the office.    As this is a virtual visit, video technology does not allow for your provider to perform a traditional examination.  This may limit your provider's ability to fully assess your condition.  If your provider identifies any concerns that need to be evaluated in person or the need to arrange testing (such as labs, EKG, etc.), we will make arrangements to do so.     Although advances in technology are sophisticated, we cannot ensure that it will always work on either your end or our end.  If the connection with a video visit is poor, the visit may have to be switched to a telephone visit.  With either a video or telephone visit, we are not always able to ensure that we have a secure connection.     I need to obtain your verbal consent now.   Are you willing to proceed with your visit today?    Charles [name] has provided verbal consent on 04/25/2021 for a virtual visit (video or telephone).   Leeanne Rio, [place]   Date: 04/25/2021 8:28 AM   Virtual Visit via Video Note   I, Leeanne Rio, connected with  Charles [name]  ([id number], 1971-04-30) on 04/25/21 at  8:15 AM EDT by a video-enabled telemedicine application and verified that I am speaking with the correct person using two identifiers.  Location: Patient: Virtual Visit Location Patient: Home Provider: Virtual Visit Location Provider: Home Office   I discussed the limitations of evaluation and management by  telemedicine and the availability of in person appointments. The patient expressed understanding and agreed to proceed.    History of Present Illness: Charles [name] is a 50 y.o. who identifies as a male who was assigned male at birth, and is being seen today for COVID-19. Patient endorses symptoms starting Monday night with fatigue and headache. Developed a low-grade fever overnight and by the next day was experiencing headaches and body aches along with nasal congestion. Denies any severe cough. Denies chest pain or SOB. Has history of reactive airway disease and Crohn's disease, currently on Humira with last dose last Friday. 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   Medications:  Current Outpatient Medications:    Ascorbic Acid (VITAMIN C PO), Take 1 tablet by mouth daily., Disp: , Rfl:    budesonide-formoterol (SYMBICORT) 160-4.5 MCG/ACT inhaler, Inhale 2 puffs into the lungs 2 (two)  times daily as needed (shortness of breath, wheezing)., Disp: , Rfl:    celecoxib (CELEBREX) 100 MG capsule, TAKE 1 CAPSULE BY MOUTH 2 TIMES DAILY AS NEEDED, Disp: 60 capsule, Rfl: 5   cholecalciferol (VITAMIN D) 1000 units tablet, Take 1,000 Units by mouth daily., Disp: , Rfl:    Cyanocobalamin (VITAMIN B-12 PO), Take 1 tablet by mouth daily., Disp: , Rfl:    Diclofenac Sodium (PENNSAID) 2 % SOLN, Place 2 g onto the skin 2 (two) times daily., Disp: 112 g, Rfl: 3   HUMIRA PEN 40 MG/0.8ML PNKT, INJECT 1 PEN UNDER THE SKIN EVERY 14 DAYS, Disp: 1 each, Rfl: 6   montelukast (SINGULAIR) 10 MG tablet, Take 1 tablet (10 mg total) by mouth at bedtime., Disp: 30 tablet, Rfl: 3   rizatriptan (MAXALT) 5 MG tablet, Take 1 tablet (5 mg total) by mouth as needed for migraine. May repeat in 2 hours if needed, Disp: 30 tablet, Rfl: 0  Observations/Objective: Patient is well-developed, well-nourished in no acute distress.  Resting comfortably at home.  Head is normocephalic, atraumatic.  No labored breathing. Speech is clear and coherent with logical content.  Patient is alert and oriented at baseline.   Assessment and Plan: 1. COVID-19 - MyChart COVID-19 home monitoring program; Future Patient with multiple risk factors for complicated course of illness. Discussed risks/benefits of antiviral medications including most common potential ADRs. Patient voiced understanding and would like to proceed with antiviral medication. They are candidate for Paxlovid giving no drug interactions and recent normal GFR on file. Rx sent to pharmacy. Supportive measures, OTC medications and vitamin regimen reviewed. Albuterol per orders. He is to contact the provider prescribing Humira to see about next dose. Patient has been enrolled in a MyChart COVID symptom monitoring program. Samule Dry reviewed in detail. Strict ER precautions discussed with patient.    Follow Up Instructions: I discussed the assessment and treatment plan  with the patient. The patient was provided an opportunity to ask questions and all were answered. The patient agreed with the plan and demonstrated an understanding of the instructions.  A copy of instructions were sent to the patient via MyChart.  The patient was advised to call back or seek an in-person evaluation if the symptoms worsen or if the condition fails to improve as anticipated.  Time:  I spent 15 minutes with the patient via telehealth technology discussing the above problems/concerns.    Leeanne Rio, PA-C

## 2021-04-25 NOTE — Telephone Encounter (Signed)
See note

## 2021-04-25 NOTE — Patient Instructions (Signed)
Charles [name], thank you for joining Charles Rio, PA-C for today's virtual visit.  While this provider is not your primary care provider (PCP), if your PCP is located in our provider database this encounter information will be shared with them immediately following your visit.  Consent: (Patient) Charles [name] provided verbal consent for this virtual visit at the beginning of the encounter.  Current Medications:  Current Outpatient Medications:    Ascorbic Acid (VITAMIN C PO), Take 1 tablet by mouth daily., Disp: , Rfl:    budesonide-formoterol (SYMBICORT) 160-4.5 MCG/ACT inhaler, Inhale 2 puffs into the lungs 2 (two) times daily as needed (shortness of breath, wheezing)., Disp: , Rfl:    celecoxib (CELEBREX) 100 MG capsule, TAKE 1 CAPSULE BY MOUTH 2 TIMES DAILY AS NEEDED, Disp: 60 capsule, Rfl: 5   cholecalciferol (VITAMIN D) 1000 units tablet, Take 1,000 Units by mouth daily., Disp: , Rfl:    Cyanocobalamin (VITAMIN B-12 PO), Take 1 tablet by mouth daily., Disp: , Rfl:    Diclofenac Sodium (PENNSAID) 2 % SOLN, Place 2 g onto the skin 2 (two) times daily., Disp: 112 g, Rfl: 3   HUMIRA PEN 40 MG/0.8ML PNKT, INJECT 1 PEN UNDER THE SKIN EVERY 14 DAYS, Disp: 1 each, Rfl: 6   montelukast (SINGULAIR) 10 MG tablet, Take 1 tablet (10 mg total) by mouth at bedtime., Disp: 30 tablet, Rfl: 3   ondansetron (ZOFRAN) 4 MG tablet, Take 1 tablet (4 mg total) by mouth every 6 (six) hours as needed for nausea., Disp: 20 tablet, Rfl: 0   predniSONE (DELTASONE) 10 MG tablet, 60 mg daily for 1 day then 50 mg daily for 1 day then 40 mg daily for 1 day then 30 mg a day for 1 day then 20 mg daily for 1 day then 10 mg daily for 1 day then stop, Disp: 21 tablet, Rfl: 0   rizatriptan (MAXALT) 5 MG tablet, Take 1 tablet (5 mg total) by mouth as needed for migraine. May repeat in 2 hours if needed, Disp: 30 tablet, Rfl: 0   Medications ordered in this encounter:  No orders of the defined types were placed in this  encounter.    *If you need refills on other medications prior to your next appointment, please contact your pharmacy*  Follow-Up: Call back or seek an in-person evaluation if the symptoms worsen or if the condition fails to improve as anticipated.  Other Instructions Please keep well-hydrated and get plenty of rest. Start a saline nasal rinse to flush out your nasal passages. You can use plain Mucinex to help thin congestion. If you have a humidifier, running in the bedroom at night. I want you to start OTC vitamin D3 1000 units daily, vitamin C 1000 mg daily, and a zinc supplement. Please take prescribed medications as directed.  You have been enrolled in a MyChart symptom monitoring program. Please answer these questions daily so we can keep track of how you are doing.  You were to quarantine for 5 days from onset of your symptoms.  After day 5, if you have had no fever and you are feeling better, you can end quarantine but need to mask for an additional 5 days. After day 5 if you have a fever or are having significant symptoms, please quarantine for full 10 days.  If you note any worsening of symptoms, any significant shortness of breath or any chest pain, please seek ER evaluation ASAP.  Please do not delay care!    If  you have been instructed to have an in-person evaluation today at a local Urgent Care facility, please use the link below. It will take you to a list of all of our available [facility], including address, phone number and hours of operation. Please do not delay care.  [facility]  If you or a family member do not have a primary care provider, use the link below to schedule a visit and establish care. When you choose a [facility] primary care physician or advanced practice provider, you gain a long-term partner in health. Find a Primary Care Provider  Learn more about [facility]'s in-office and virtual care options: [facility]

## 2021-04-26 ENCOUNTER — Telehealth: Payer: Self-pay

## 2021-04-26 NOTE — Telephone Encounter (Signed)
BPA triggered for worsening symptoms cough. Patient called, left VM to return the call to speak to a nurse about symptoms [phone].

## 2021-04-30 ENCOUNTER — Encounter: Payer: Self-pay | Admitting: Family Medicine

## 2021-05-01 ENCOUNTER — Encounter: Payer: Self-pay | Admitting: Family Medicine

## 2021-05-01 ENCOUNTER — Telehealth (INDEPENDENT_AMBULATORY_CARE_PROVIDER_SITE_OTHER): Payer: 59 | Admitting: Family Medicine

## 2021-05-01 ENCOUNTER — Telehealth: Payer: Self-pay

## 2021-05-01 VITALS — Temp 98.8°F

## 2021-05-01 DIAGNOSIS — U071 COVID-19: Secondary | ICD-10-CM

## 2021-05-01 NOTE — Progress Notes (Signed)
Virtual Visit via Video Note  I connected with Charles [name]  on 05/01/21 at  5:00 PM EDT by a video enabled telemedicine application and verified that I am speaking with the correct person using two identifiers.  Location patient: home, [place] Location provider:work or home office Persons participating in the virtual visit: patient, provider  I discussed the limitations of evaluation and management by telemedicine and the availability of in person appointments. The patient expressed understanding and agreed to proceed.   HPI:  Acute telemedicine visit for Covid19 -Onset: Covid19 started about 1 week ago and was treated with Paxlovid which he finished about 3 days ago - symptoms improved on the paxlovid, but then started to get some symptoms again the last few days with some congestion, mild cough, sinus headache -he reports he did test positive for covid today -Denies fevers, CP, SOB, inability to eat or drink or get out of bed (currently at work) -Pertinent past medical history:see below -Pertinent medication allergies: Allergies  Allergen Reactions   Azathioprine Other (See Comments)    Serum sickness / acute hypersensitivity reaction with possible Acute pancreatitis   Mercaptopurine Nausea And Vomiting    Felt ill within 2 days of initiating therapy.  -COVID-19 vaccine status: vaccinated and had a booster  ROS: See pertinent positives and negatives per HPI.  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  with pouch ulcer.    FUNCTIONAL ENDOSCOPIC SINUS SURGERY  12/2003   partial ethmoidectomy.  Dr Wilburn Cornelia   ileoanal pull-through     LUMBAR LAMINECTOMY/DECOMPRESSION MICRODISCECTOMY Right 10/13/2015   Procedure: Right Lumbar four- five Microdiskectomy;  Surgeon: Erline Levine, MD;  Location: [facility];  Service: Neurosurgery;  Laterality: Right;  Right L4-5 Microdiskectomy   sacroilitis     SIGMOIDOSCOPY  01/2005   VENTRAL HERNIA REPAIR  06/2014     Current Outpatient Medications:    Ascorbic Acid (VITAMIN C PO), Take 1 tablet by mouth daily., Disp: , Rfl:    budesonide-formoterol (SYMBICORT) 160-4.5 MCG/ACT inhaler, Inhale 2 puffs into the lungs 2 (two) times daily as needed (shortness of breath, wheezing)., Disp: , Rfl:    celecoxib (CELEBREX) 100 MG capsule, TAKE 1 CAPSULE BY MOUTH 2 TIMES DAILY AS NEEDED, Disp: 60 capsule, Rfl: 5   cholecalciferol (VITAMIN D) 1000 units tablet, Take 1,000 Units by mouth daily., Disp: , Rfl:    Cyanocobalamin (VITAMIN B-12 PO), Take 1 tablet by mouth daily., Disp: , Rfl:    Diclofenac Sodium (PENNSAID) 2 % SOLN, Place 2 g onto the skin 2 (two)  times daily., Disp: 112 g, Rfl: 3   HUMIRA PEN 40 MG/0.8ML PNKT, INJECT 1 PEN UNDER THE SKIN EVERY 14 DAYS, Disp: 1 each, Rfl: 6   montelukast (SINGULAIR) 10 MG tablet, Take 1 tablet (10 mg total) by mouth at bedtime. (Patient taking differently: Take 10 mg by mouth at bedtime as needed.), Disp: 30 tablet, Rfl: 3   rizatriptan (MAXALT) 5 MG tablet, Take 1 tablet (5 mg total) by mouth as needed for migraine. May repeat in 2 hours if needed, Disp: 30 tablet, Rfl: 0  EXAM:  VITALS per patient if  applicable:  GENERAL: alert, oriented, appears well and in no acute distress  HEENT: atraumatic, conjunttiva clear, no obvious abnormalities on inspection of external nose and ears  NECK: normal movements of the head and neck  LUNGS: on inspection no signs of respiratory distress, breathing rate appears normal, no obvious gross SOB, gasping or wheezing  CV: no obvious cyanosis  MS: moves all visible extremities without noticeable abnormality  PSYCH/NEURO: pleasant and cooperative, no obvious depression or anxiety, speech and thought processing grossly intact  ASSESSMENT AND PLAN:  Discussed the following assessment and plan:  COVID-19  -we discussed possible serious and likely etiologies, options for evaluation and workup, limitations of telemedicine visit vs in person visit, treatment, treatment risks and precautions. Pt is agreeable to treatment via telemedicine at this moment.  Query rebound COVID, intermittent symptoms with COVID as I have seen that up to 2 to 3 weeks with his current strain, versus other.  Also discussed possibility of sinusitis.  He prefers to avoid medications as possible, as they tend to upset his stomach.  Is opted to try some nasal saline and monitor symptoms closely.  Did advise that he re-isolate for 5 days +5 days of mask wearing, as he did test positive for COVID today. Work/School slipped offered:  declined Advised to seek prompt in person care if worsening, new symptoms arise, or if is not improving with treatment. Discussed options for inperson care if PCP office not available. Did let this patient know that I only do telemedicine on Tuesdays and Thursdays for [facility]. Advised to schedule follow up visit with PCP or UCC if any further questions or concerns to avoid delays in care.   I discussed the assessment and treatment plan with the patient. The patient was provided an opportunity to ask questions and all were answered. The patient agreed with the plan  and demonstrated an understanding of the instructions.     Lucretia Kern, DO

## 2021-05-01 NOTE — Patient Instructions (Addendum)
  HOME CARE TIPS:  -Recommend re-isolate for 5 days and then wear a mask for 5 additional days and until symptoms resolve.  -Nasal saline twice daily  -stay hydrated, drink plenty of fluids and eat small healthy meals - avoid dairy  -follow up with your doctor in 2-3 days unless improving and feeling better  It was nice to meet you today, and I really hope you are feeling better soon. I help [facility] out with telemedicine visits on Tuesdays and Thursdays and am available for visits on those days. If you have any concerns or questions following this visit please schedule a follow up visit with your Primary Care doctor or seek care at a local urgent care clinic to avoid delays in care.    Seek in person care or schedule a follow up video visit promptly if your symptoms worsen, new concerns arise or you are not improving with treatment. Call 911 and/or seek emergency care if your symptoms are severe or life threatening.

## 2021-05-01 NOTE — Telephone Encounter (Signed)
error 

## 2021-05-03 NOTE — Telephone Encounter (Signed)
Spoke with patient to check on him Patient doing better, did not need antiviral  Had negative Covid test, patient back to work

## 2021-05-15 ENCOUNTER — Encounter: Payer: Self-pay | Admitting: Internal Medicine

## 2021-05-15 ENCOUNTER — Ambulatory Visit (INDEPENDENT_AMBULATORY_CARE_PROVIDER_SITE_OTHER): Payer: 59 | Admitting: Internal Medicine

## 2021-05-15 VITALS — BP 102/78 | HR 92 | Ht 69.25 in | Wt 154.2 lb

## 2021-05-15 DIAGNOSIS — K91850 Pouchitis: Secondary | ICD-10-CM | POA: Diagnosis not present

## 2021-05-15 DIAGNOSIS — K853 Drug induced acute pancreatitis without necrosis or infection: Secondary | ICD-10-CM | POA: Diagnosis not present

## 2021-05-15 DIAGNOSIS — K50118 Crohn's disease of large intestine with other complication: Secondary | ICD-10-CM | POA: Diagnosis not present

## 2021-05-15 DIAGNOSIS — T50905D Adverse effect of unspecified drugs, medicaments and biological substances, subsequent encounter: Secondary | ICD-10-CM

## 2021-05-15 NOTE — Patient Instructions (Signed)
If you are age 48 or older, your body mass index should be between 23-30. Your Body mass index is 22.61 kg/m. If this is out of the aforementioned range listed, please consider follow up with your Primary Care Provider.  If you are age 25 or younger, your body mass index should be between 19-25. Your Body mass index is 22.61 kg/m. If this is out of the aformentioned range listed, please consider follow up with your Primary Care Provider.   __________________________________________________________  The [facility] providers would like to encourage you to use [facility] to communicate with providers for non-urgent requests or questions.  Due to long hold times on the telephone, sending your provider a message by [facility] may be a faster and more efficient way to get a response.  Please allow 48 business hours for a response.  Please remember that this is for non-urgent requests.   Please come Friday and have lab work drawn. The lab is open 8-5, no appointment is needed.  I appreciate the opportunity to care for you. Silvano Rusk, MD, [facility]

## 2021-05-15 NOTE — Progress Notes (Signed)
Charles [name] 50 y.o. 24-Nov-1970 [id number]  Assessment & Plan:   Encounter Diagnoses  Name Primary?   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  I am afraid that we are losing the ability to use this drug both with antibodies and may be financially.  Consider referral to a tertiary center with special expertise in pouchitis.  We discussed the possibility of a pouchoscopy I would not do that right now, not sure what I would do with the information though we may need to do that in the future versus tertiary eval.  It is clear he is sensitive or allergic to azathioprine and/or 6-mercaptopurine and that will not be used again.  We discussed the role of diet and how I think that it matters but it is hard to pinpoint what to do though certainly recommend limiting if not illuminating highly processed foods.  Follow-up to be determined pending review of the labs.  Orders Placed This Encounter  Procedures   Adalimumab+Ab (Serial Monitor)    CC: Vivi Barrack, MD   Subjective:   Chief Complaint: Follow-up of pouchitis and history of pancreatitis  HPI Mat is here for follow-up exam, last time he was seen was during a hospitalization for acute pancreatitis related to azathioprine.  He is recovered from that.  He relates reducing alcohol consumption and taking a supplement vitamin with probiotic (athletic greens) and having less stool frequency.  Thinks may be from reducing alcohol consumption during the week.  He would have 1 or 2 shots of bourbon during the week but has cut that out and has just a few on the weekends now.  He is sleeping better as well.  Currently training for a competitive run.  Less achy overall as well.  He is having issues with reimbursement and coverage for his Humira.  In the past the  program through the manufacturer would cover things and now it is not.  He has a call into the pharmaceutical company that supplies these discount cards.   Wt Readings from Last 3 Encounters:  05/15/21 154 lb 4 oz (70 kg)  03/01/21 157 lb 10.1 oz (71.5 kg)  11/07/20 155 lb 4 oz (70.4 kg)     Allergies  Allergen Reactions   Azathioprine Other (See Comments)    Serum sickness / acute hypersensitivity reaction with possible Acute pancreatitis   Mercaptopurine Nausea And Vomiting    Felt ill within 2 days of initiating therapy.   Current Meds  Medication Sig   Ascorbic Acid (VITAMIN C PO) Take 1 tablet by mouth daily.   budesonide-formoterol (SYMBICORT) 160-4.5 MCG/ACT inhaler Inhale 2 puffs into the lungs 2 (two) times daily as needed (shortness of breath, wheezing).   celecoxib (CELEBREX) 100 MG capsule TAKE 1 CAPSULE BY MOUTH 2 TIMES DAILY AS NEEDED   cholecalciferol (VITAMIN D) 1000 units tablet Take 1,000 Units by mouth daily.   Cyanocobalamin (VITAMIN B-12 PO) Take 1 tablet by mouth daily.   Diclofenac Sodium (PENNSAID) 2 % SOLN Place 2 g onto the skin 2 (two) times daily.   HUMIRA PEN 40 MG/0.8ML PNKT INJECT 1 PEN UNDER THE SKIN EVERY 14 DAYS   montelukast (SINGULAIR) 10 MG tablet Take 1 tablet (10 mg total) by mouth at bedtime. (Patient taking differently: Take 10 mg by mouth at bedtime as  needed.)   rizatriptan (MAXALT) 5 MG tablet Take 1 tablet (5 mg total) by mouth as needed for migraine. 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  with pouch ulcer.    FUNCTIONAL ENDOSCOPIC SINUS SURGERY  12/2003   partial ethmoidectomy.  Dr Wilburn Cornelia   ileoanal pull-through     LUMBAR LAMINECTOMY/DECOMPRESSION MICRODISCECTOMY Right 10/13/2015   Procedure: Right Lumbar four- five Microdiskectomy;  Surgeon: Erline Levine, MD;  Location: [facility];  Service: Neurosurgery;  Laterality: Right;  Right L4-5 Microdiskectomy   sacroilitis     SIGMOIDOSCOPY  01/2005   VENTRAL HERNIA REPAIR  06/2014   Social History   Social History Narrative   Father a Technical brewer in [place].   Married to Dr. 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   Review of Systems As above  Objective:   Physical Exam BP 102/78 (BP Location: Left Arm, Patient Position: Sitting, Cuff Size: Normal)   Pulse 92   Ht 5' 9.25" (1.759 m)   Wt 154 lb 4 oz (70 kg)   BMI 22.61 kg/m   Thin well-developed white man no acute distress

## 2021-05-18 ENCOUNTER — Other Ambulatory Visit: Payer: 59

## 2021-05-18 DIAGNOSIS — K50118 Crohn's disease of large intestine with other complication: Secondary | ICD-10-CM

## 2021-05-28 LAB — ADALIMUMAB+AB (SERIAL MONITOR)
Adalimumab Drug Level: 5.7 ug/mL
Anti-Adalimumab Antibody: 32 ng/mL

## 2021-05-28 LAB — SERIAL MONITORING

## 2021-06-14 ENCOUNTER — Encounter: Payer: Self-pay | Admitting: Family Medicine

## 2021-06-14 ENCOUNTER — Other Ambulatory Visit: Payer: Self-pay | Admitting: Internal Medicine

## 2021-06-19 ENCOUNTER — Other Ambulatory Visit: Payer: Self-pay

## 2021-06-19 ENCOUNTER — Ambulatory Visit (INDEPENDENT_AMBULATORY_CARE_PROVIDER_SITE_OTHER): Payer: 59 | Admitting: *Deleted

## 2021-06-19 DIAGNOSIS — Z23 Encounter for immunization: Secondary | ICD-10-CM | POA: Diagnosis not present

## 2021-07-03 ENCOUNTER — Telehealth: Payer: Self-pay

## 2021-07-03 NOTE — Telephone Encounter (Signed)
Pt stated that he was made aware of this already and that he is in the process of handling it.

## 2021-07-03 NOTE — Telephone Encounter (Signed)
Received a fax from [facility] stating that: " After several attempts, we have not been able to contact the patient to discuss the details of our benefits investigation. As a result, we will be closing the case. If you are able to make contact with the patient, please ask him/her to call [facility] at the number below."  Left message for pt to call back.

## 2021-07-23 ENCOUNTER — Encounter: Payer: Self-pay | Admitting: Family Medicine

## 2021-07-23 ENCOUNTER — Other Ambulatory Visit: Payer: Self-pay

## 2021-07-23 ENCOUNTER — Ambulatory Visit (INDEPENDENT_AMBULATORY_CARE_PROVIDER_SITE_OTHER): Payer: 59 | Admitting: Family Medicine

## 2021-07-23 VITALS — BP 126/84 | HR 80 | Temp 98.0°F | Ht 69.25 in | Wt 155.8 lb

## 2021-07-23 DIAGNOSIS — E559 Vitamin D deficiency, unspecified: Secondary | ICD-10-CM

## 2021-07-23 DIAGNOSIS — K50118 Crohn's disease of large intestine with other complication: Secondary | ICD-10-CM | POA: Diagnosis not present

## 2021-07-23 DIAGNOSIS — Z0001 Encounter for general adult medical examination with abnormal findings: Secondary | ICD-10-CM | POA: Diagnosis not present

## 2021-07-23 DIAGNOSIS — Z111 Encounter for screening for respiratory tuberculosis: Secondary | ICD-10-CM

## 2021-07-23 DIAGNOSIS — Z13220 Encounter for screening for lipoid disorders: Secondary | ICD-10-CM

## 2021-07-23 DIAGNOSIS — J309 Allergic rhinitis, unspecified: Secondary | ICD-10-CM

## 2021-07-23 DIAGNOSIS — M461 Sacroiliitis, not elsewhere classified: Secondary | ICD-10-CM

## 2021-07-23 DIAGNOSIS — D849 Immunodeficiency, unspecified: Secondary | ICD-10-CM | POA: Diagnosis not present

## 2021-07-23 LAB — VITAMIN D 25 HYDROXY (VIT D DEFICIENCY, FRACTURES): VITD: 16.52 ng/mL — ABNORMAL LOW (ref 30.00–100.00)

## 2021-07-23 LAB — TSH: TSH: 1.15 u[IU]/mL (ref 0.35–5.50)

## 2021-07-23 LAB — LIPID PANEL
Cholesterol: 144 mg/dL (ref 0–200)
HDL: 46 mg/dL (ref >39.00)
NonHDL: 98.49
Total CHOL/HDL Ratio: 3
Triglycerides: 231 mg/dL — ABNORMAL HIGH (ref 0.0–149.0)
VLDL: 46.2 mg/dL — ABNORMAL HIGH (ref 0.0–40.0)

## 2021-07-23 LAB — CBC
HCT: 43.8 % (ref 39.0–52.0)
Hemoglobin: 15.1 g/dL (ref 13.0–17.0)
MCHC: 34.4 g/dL (ref 30.0–36.0)
MCV: 92.6 fl (ref 78.0–100.0)
Platelets: 226 10*3/uL (ref 150.0–400.0)
RBC: 4.73 Mil/uL (ref 4.22–5.81)
RDW: 12.8 % (ref 11.5–15.5)
WBC: 6.1 10*3/uL (ref 4.0–10.5)

## 2021-07-23 LAB — IBC + FERRITIN
Ferritin: 24.9 ng/mL (ref 22.0–322.0)
Iron: 150 ug/dL (ref 42–165)
Saturation Ratios: 43.2 % (ref 20.0–50.0)
TIBC: 347.2 ug/dL (ref 250.0–450.0)
Transferrin: 248 mg/dL (ref 212.0–360.0)

## 2021-07-23 LAB — COMPREHENSIVE METABOLIC PANEL
ALT: 18 U/L (ref 0–53)
AST: 23 U/L (ref 0–37)
Albumin: 4.1 g/dL (ref 3.5–5.2)
Alkaline Phosphatase: 59 U/L (ref 39–117)
BUN: 12 mg/dL (ref 6–23)
CO2: 29 mEq/L (ref 19–32)
Calcium: 9.8 mg/dL (ref 8.4–10.5)
Chloride: 99 mEq/L (ref 96–112)
Creatinine, Ser: 0.91 mg/dL (ref 0.40–1.50)
GFR: 99.49 mL/min (ref >60.00)
Glucose, Bld: 127 mg/dL — ABNORMAL HIGH (ref 70–99)
Potassium: 3.7 mEq/L (ref 3.5–5.1)
Sodium: 136 mEq/L (ref 135–145)
Total Bilirubin: 2.3 mg/dL — ABNORMAL HIGH (ref 0.2–1.2)
Total Protein: 7.3 g/dL (ref 6.0–8.3)

## 2021-07-23 LAB — LDL CHOLESTEROL, DIRECT: Direct LDL: 77 mg/dL

## 2021-07-23 LAB — VITAMIN B12: Vitamin B-12: 349 pg/mL (ref 211–911)

## 2021-07-23 NOTE — Patient Instructions (Signed)
It was very nice to see you today!  We will check blood work today.  Please continue working on diet and exercise.  We will see you back in a year for your next physical.  Come back sooner if needed.  Take care, Dr [name]  PLEASE NOTE:  If you had any lab tests please let us know if you have not heard back within a few days. You may see your results on mychart before we have a chance to review them but we will give you a call once they are reviewed by [place]. If we ordered any referrals today, please let us know if you have not heard from their office within the next week.   Please try these tips to maintain a healthy lifestyle:  Eat at least 3 REAL meals and 1-2 snacks per day.  Aim for no more than 5 hours between eating.  If you eat breakfast, please do so within one hour of getting up.   Each meal should contain half fruits/vegetables, one quarter protein, and one quarter carbs (no bigger than a computer mouse)  Cut down on sweet beverages. This includes juice, soda, and sweet tea.   Drink at least 1 glass of water with each meal and aim for at least 8 glasses per day  Exercise at least 150 minutes every week.    Preventive Care 79-69 Years Old, Male Preventive care refers to lifestyle choices and visits with your health care provider that can promote health and wellness. Preventive care visits are also called wellness exams. What can I expect for my preventive care visit? Counseling During your preventive care visit, your health care provider may ask about your: Medical history, including: Past medical problems. Family medical history. Current health, including: Emotional well-being. Home life and relationship well-being. Sexual activity. Lifestyle, including: Alcohol, nicotine or tobacco, and drug use. Access to firearms. Diet, exercise, and sleep habits. Safety issues such as seatbelt and bike helmet use. Sunscreen use. Work and work Statistician. Physical exam Your  health care provider will check your: Height and weight. These may be used to calculate your BMI (body mass index). BMI is a measurement that tells if you are at a healthy weight. Waist circumference. This measures the distance around your waistline. This measurement also tells if you are at a healthy weight and may help predict your risk of certain diseases, such as type 2 diabetes and high blood pressure. Heart rate and blood pressure. Body temperature. Skin for abnormal spots. What immunizations do I need? Vaccines are usually given at various ages, according to a schedule. Your health care provider will recommend vaccines for you based on your age, medical history, and lifestyle or other factors, such as travel or where you work. What tests do I need? Screening Your health care provider may recommend screening tests for certain conditions. This may include: Lipid and cholesterol levels. Diabetes screening. This is done by checking your blood sugar (glucose) after you have not eaten for a while (fasting). Hepatitis B test. Hepatitis C test. HIV (human immunodeficiency virus) test. STI (sexually transmitted infection) testing, if you are at risk. Lung cancer screening. Prostate cancer screening. Colorectal cancer screening. Talk with your health care provider about your test results, treatment options, and if necessary, the need for more tests. Follow these instructions at home: Eating and drinking  Eat a diet that includes fresh fruits and vegetables, whole grains, lean protein, and low-fat dairy products. Take vitamin and mineral supplements as recommended by  your health care provider. Do not drink alcohol if your health care provider tells you not to drink. If you drink alcohol: Limit how much you have to 0-2 drinks a day. Know how much alcohol is in your drink. In the [place] one drink equals one 12 oz bottle of beer (355 mL), one 5 oz glass of wine (148 mL), or one 1 oz glass of  hard liquor (44 mL). Lifestyle Brush your teeth every morning and night with fluoride toothpaste. Floss one time each day. Exercise for at least 30 minutes 5 or more days each week. Do not use any products that contain nicotine or tobacco. These products include cigarettes, chewing tobacco, and vaping devices, such as e-cigarettes. If you need help quitting, ask your health care provider. Do not use drugs. If you are sexually active, practice safe sex. Use a condom or other form of protection to prevent STIs. Take aspirin only as told by your health care provider. Make sure that you understand how much to take and what form to take. Work with your health care provider to find out whether it is safe and beneficial for you to take aspirin daily. Find healthy ways to manage stress, such as: Meditation, yoga, or listening to music. Journaling. Talking to a trusted person. Spending time with friends and family. Minimize exposure to UV radiation to reduce your risk of skin cancer. Safety Always wear your seat belt while driving or riding in a vehicle. Do not drive: If you have been drinking alcohol. Do not ride with someone who has been drinking. When you are tired or distracted. While texting. If you have been using any mind-altering substances or drugs. Wear a helmet and other protective equipment during sports activities. If you have firearms in your house, make sure you follow all gun safety procedures. What's next? Go to your health care provider once a year for an annual wellness visit. Ask your health care provider how often you should have your eyes and teeth checked. Stay up to date on all vaccines. This information is not intended to replace advice given to you by your health care provider. Make sure you discuss any questions you have with your health care provider. Document Revised: 01/31/2021 Document Reviewed: 01/31/2021 Elsevier Patient Education  [place].

## 2021-07-23 NOTE — Assessment & Plan Note (Signed)
Stable. UTD on vaccines.

## 2021-07-23 NOTE — Progress Notes (Signed)
Chief Complaint:  Charles [name] is a 50 y.o. male who presents today for his annual comprehensive physical exam.    Assessment/Plan:  Chronic Problems Addressed Today: Crohn's disease ([place]) Stable.  Continue management per GI.  Vitamin D deficiency Check vitamin D.  Immunosuppression ([facility]) Stable. UTD on vaccines.   Allergic rhinitis Stable on singulair.   Preventative Healthcare: Check Labs.  Up-to-date on vaccines and screenings.  Patient Counseling(The following topics were reviewed and/or handout was given):  -Nutrition: Stressed importance of moderation in sodium/caffeine intake, saturated fat and cholesterol, caloric balance, sufficient intake of fresh fruits, vegetables, and fiber.  -Stressed the importance of regular exercise.   -Substance Abuse: Discussed cessation/primary prevention of tobacco, alcohol, or other drug use; driving or other dangerous activities under the influence; availability of treatment for abuse.   -Injury prevention: Discussed safety belts, safety helmets, smoke detector, smoking near bedding or upholstery.   -Sexuality: Discussed sexually transmitted diseases, partner selection, use of condoms, avoidance of unintended pregnancy and contraceptive alternatives.   -Dental health: Discussed importance of regular tooth brushing, flossing, and dental visits.  -Health maintenance and immunizations reviewed. Please refer to Health maintenance section.  Return to care in 1 year for next preventative visit.     Subjective:  HPI:  He has no acute complaints today. See A/p for status of chronic conditions.   Lifestyle Diet: Limited due to Crohn's.  Exercise: Likes running   Depression screen [facility] 2/9 07/23/2021  Decreased Interest 0  Down, Depressed, Hopeless 0  PHQ - 2 Score 0  Some recent data might be hidden    Health Maintenance Due  Topic Date Due   Pneumococcal Vaccine 32-15 Years old (2 - PCV) 05/01/2011   COVID-19 Vaccine (4 - Booster  for Pfizer series) 07/03/2020     ROS: Per HPI, otherwise a complete review of systems was negative.   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   Drug-induced acute pancreatitis without infection or necrosis 02/28/2021   Dysplastic nevi    GERD (gastroesophageal reflux disease)    Herniated disc    History of proctitis 2006   IBD (inflammatory bowel disease)    Incisional hernia    Migraine headache    Morton's neuroma of right foot    Osteopenia    Pouchitis ([place]) 2006   Small bowel obstruction due to adhesions ([place]) 02/09/2015   Thyroid disease    Vitamin D deficiency    Patient Active Problem List   Diagnosis Date Noted   Immunosuppression ([place]) 07/23/2021   Adverse effect of azathioprine 02/28/2021   Serum sickness due to drug 02/27/2021   Degenerative disc disease, lumbar 06/13/2020   Synovitis of right knee 04/26/2020   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   Allergic rhinitis 11/10/2018   GERD (gastroesophageal reflux disease) 09/25/2018   Anxiety state 07/05/2016   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  with pouch ulcer.    FUNCTIONAL ENDOSCOPIC SINUS SURGERY  12/2003    partial ethmoidectomy.  Dr Wilburn Cornelia   ileoanal pull-through     LUMBAR LAMINECTOMY/DECOMPRESSION MICRODISCECTOMY Right 10/13/2015   Procedure: Right Lumbar four- five Microdiskectomy;  Surgeon: Erline Levine, MD;  Location: [facility];  Service: Neurosurgery;  Laterality: Right;  Right L4-5 Microdiskectomy   sacroilitis     SIGMOIDOSCOPY  01/2005   VENTRAL HERNIA REPAIR  06/2014    Family History  Problem Relation Age of Onset   Ulcerative colitis Mother    Berenice Primas' disease Mother    Colon cancer Paternal Grandmother    Brain cancer Paternal Grandmother        mets from colon    Medications- reviewed and updated Current Outpatient Medications  Medication Sig Dispense Refill   Ascorbic Acid (VITAMIN C PO) Take 1 tablet by mouth daily.     budesonide-formoterol (SYMBICORT) 160-4.5 MCG/ACT inhaler Inhale 2 puffs into the lungs 2 (two) times daily as needed (shortness of breath, wheezing).     celecoxib (CELEBREX) 100 MG capsule TAKE 1 CAPSULE BY MOUTH 2 TIMES DAILY AS NEEDED 60 capsule 5   cholecalciferol (VITAMIN D) 1000 units tablet Take 1,000 Units by mouth daily.     Cyanocobalamin (VITAMIN B-12 PO) Take 1 tablet by mouth daily.     Diclofenac Sodium (PENNSAID) 2 % SOLN Place 2 g onto the skin 2 (two) times daily. 112 g 3   HUMIRA PEN 40 MG/0.8ML PNKT INJECT 1 PEN UNDER THE SKIN EVERY 14 DAYS 1 each 6   montelukast (SINGULAIR) 10 MG tablet Take 1 tablet (10 mg total) by mouth at bedtime. (Patient taking differently: Take 10 mg by mouth at bedtime as needed.) 30 tablet 3   rizatriptan (MAXALT) 5 MG tablet Take 1 tablet (5 mg total) by mouth as needed for migraine. May repeat in 2 hours if needed 30 tablet 0   No current facility-administered medications for this visit.    Allergies-reviewed and updated Allergies  Allergen Reactions   Azathioprine Other (See Comments)    Serum sickness / acute hypersensitivity reaction with possible Acute pancreatitis   Mercaptopurine Nausea And  Vomiting    Felt ill within 2 days of initiating therapy.    Social History   Socioeconomic History   Marital status: Divorced    Spouse name: Not on file   Number of children: 2   Years of education: 16 at least   Highest education level: Not on file  Occupational History   Occupation: Associate Professor rep    Employer: [employer]  Tobacco Use   Smoking status: Never   Smokeless tobacco: Never  Vaping Use   Vaping Use: Never used  Substance and Sexual Activity   Alcohol use: Yes    Alcohol/week: 14.0 standard drinks    Types: 14 Glasses of wine per week    Comment: couple of drinks daily   Drug use: No   Sexual activity: Yes  Other Topics Concern   Not on file  Social History Narrative   Father a pulmonologist in [place].   Married to Dr. Honor Junes daughter Gerald Stabs -divorced 2018   2 sons born approximately 2006 and 2009    Employed as a Freight forwarder in Press photographer for [employer]   + EtOH  No tobacco   No drugs   Social Determinants of Radio broadcast assistant Strain: Not on file  Food Insecurity: Not on file  Transportation Needs: Not on file  Physical Activity: Not on file  Stress: Not on file  Social Connections: Not on file        Objective:  Physical Exam: BP 126/84   Pulse 80   Temp 98 F (36.7 C) (Temporal)   Ht 5' 9.25" (1.759 m)   Wt 155 lb 12.8 oz (70.7 kg)   SpO2 99%   BMI 22.84 kg/m   Body mass index is 22.84 kg/m. Wt Readings from Last 3 Encounters:  07/23/21 155 lb 12.8 oz (70.7 kg)  05/15/21 154 lb 4 oz (70 kg)  03/01/21 157 lb 10.1 oz (71.5 kg)   Gen: NAD, resting comfortably HEENT: TMs normal bilaterally. OP clear. No thyromegaly noted.  CV: RRR with no murmurs appreciated Pulm: NWOB, CTAB with no crackles, wheezes, or rhonchi GI: Normal bowel sounds present. Soft, Nontender, Nondistended. MSK: no edema, cyanosis, or clubbing noted Skin: warm, dry Neuro: CN2-12 grossly intact. Strength 5/5 in upper and lower extremities. Reflexes  symmetric and intact bilaterally.  Psych: Normal affect and thought content     Imara Standiford M. [name], MD 07/23/2021 1:47 PM

## 2021-07-23 NOTE — Assessment & Plan Note (Signed)
Stable.  Continue management per GI.

## 2021-07-23 NOTE — Assessment & Plan Note (Signed)
Check vitamin D. 

## 2021-07-23 NOTE — Assessment & Plan Note (Signed)
Stable on singulair.

## 2021-07-25 ENCOUNTER — Other Ambulatory Visit: Payer: Self-pay | Admitting: *Deleted

## 2021-07-25 ENCOUNTER — Encounter: Payer: Self-pay | Admitting: Family Medicine

## 2021-07-25 DIAGNOSIS — E785 Hyperlipidemia, unspecified: Secondary | ICD-10-CM | POA: Insufficient documentation

## 2021-07-25 LAB — QUANTIFERON-TB GOLD PLUS
Mitogen-NIL: >10 IU/mL
NIL: 0.09 IU/mL
QuantiFERON-TB Gold Plus: NEGATIVE
TB1-NIL: 0.02 IU/mL
TB2-NIL: 0.02 IU/mL

## 2021-07-25 MED ORDER — VITAMIN D (ERGOCALCIFEROL) 1.25 MG (50000 UNIT) PO CAPS: 50000.0000 [IU] | ORAL_CAPSULE | ORAL | 0 refills | Status: DC

## 2021-07-26 ENCOUNTER — Other Ambulatory Visit: Payer: Self-pay | Admitting: *Deleted

## 2021-07-26 ENCOUNTER — Telehealth: Payer: Self-pay

## 2021-07-26 NOTE — Progress Notes (Signed)
Please inform patient of the following:  Triglycerides are not a concern unless they are very elevated (above 800s). He can come back if he wishes but it would not change his management.  Charles [name]. [name], MD 07/26/2021 12:56 PM

## 2021-07-26 NOTE — Telephone Encounter (Signed)
Pt was returning a call regarding labs. Please Advise.

## 2021-07-27 ENCOUNTER — Other Ambulatory Visit: Payer: Self-pay | Admitting: *Deleted

## 2021-07-27 DIAGNOSIS — E559 Vitamin D deficiency, unspecified: Secondary | ICD-10-CM

## 2021-07-27 NOTE — Telephone Encounter (Signed)
See results note. 

## 2021-09-24 ENCOUNTER — Other Ambulatory Visit (INDEPENDENT_AMBULATORY_CARE_PROVIDER_SITE_OTHER): Payer: 59

## 2021-09-24 ENCOUNTER — Other Ambulatory Visit: Payer: Self-pay

## 2021-09-24 DIAGNOSIS — E559 Vitamin D deficiency, unspecified: Secondary | ICD-10-CM

## 2021-09-24 LAB — LIPID PANEL
Cholesterol: 181 mg/dL (ref 0–200)
HDL: 52.4 mg/dL (ref >39.00)
LDL Cholesterol: 97 mg/dL (ref 0–99)
NonHDL: 128.35
Total CHOL/HDL Ratio: 3
Triglycerides: 158 mg/dL — ABNORMAL HIGH (ref 0.0–149.0)
VLDL: 31.6 mg/dL (ref 0.0–40.0)

## 2021-09-24 LAB — VITAMIN D 25 HYDROXY (VIT D DEFICIENCY, FRACTURES): VITD: 27.71 ng/mL — ABNORMAL LOW (ref 30.00–100.00)

## 2021-09-25 NOTE — Progress Notes (Signed)
Please inform patient of the following:  Vitamin D is much better. HE can continue with 1000IU daily maintenance and we can recheck in 3-6 months. Triglycerides are much better as well.  [name]. [name], MD 09/25/2021 5:02 PM

## 2021-10-18 ENCOUNTER — Other Ambulatory Visit: Payer: Self-pay

## 2021-10-18 ENCOUNTER — Ambulatory Visit (INDEPENDENT_AMBULATORY_CARE_PROVIDER_SITE_OTHER): Payer: 59 | Admitting: Family Medicine

## 2021-10-18 ENCOUNTER — Encounter: Payer: Self-pay | Admitting: Family Medicine

## 2021-10-18 VITALS — BP 132/86 | HR 68 | Temp 98.3°F | Ht 69.25 in | Wt 157.2 lb

## 2021-10-18 DIAGNOSIS — G4700 Insomnia, unspecified: Secondary | ICD-10-CM | POA: Diagnosis not present

## 2021-10-18 DIAGNOSIS — F411 Generalized anxiety disorder: Secondary | ICD-10-CM

## 2021-10-18 MED ORDER — TRAZODONE HCL 50 MG PO TABS: 25.0000 mg | ORAL_TABLET | Freq: Every evening | ORAL | 3 refills | Status: DC | PRN

## 2021-10-18 MED ORDER — AMOXICILLIN-POT CLAVULANATE 875-125 MG PO TABS: 1.0000 | ORAL_TABLET | Freq: Two times a day (BID) | ORAL | 0 refills | Status: DC

## 2021-10-18 MED ORDER — PREDNISONE 20 MG PO TABS: 20.0000 mg | ORAL_TABLET | Freq: Every day | ORAL | 0 refills | Status: DC

## 2021-10-18 NOTE — Progress Notes (Signed)
? ?  Charles [name] is a 51 y.o. male who presents today for an office visit. ? ?Assessment/Plan:  ?New/Acute Problems: ?Sinusitis ?No red flags.  Given length of symptoms we will start course of Augmentin.  Would also like to try prednisone.  Discussed reasons to return to care.  If not improving will need to follow back up with ENT. ? ?Chronic Problems Addressed Today: ?Insomnia ?Had lengthy discussion with patient regarding treatment options.  We will try trazodone 25 to 50 mg nightly as needed.  He will check with me in a few weeks via MyChart.  Discussed with patient prednisone may interfere with his sleep - he may defer starting medications until after finishing his course of prednisone.  ? ?Anxiety state ?Currently manageable.  Does have a lot of anxiety around his insomnia.  We will be starting trazodone as above. ? ? ?  ?Subjective:  ?HPI: ? ?Patient here with sinus infection. This has been going on for over a month. His symptoms include nasal congestion. He has been coughing some mucus as well. He has tried several medication in the past. This has helped. However, he thinks his symptoms flare up. He has been on Prednisone for long period of times. Some drainage. He has seen ENT in the past. No fever or chills. No shortness of breath. He has a hx of allergic rhinitis and airway hyperactivity.  ?  ?He has increased anxiety for the past few months. This is mostly due to restless sleep. He has some difficulty sleeping. He notes he get anxious if he is unable to sleep. He had to go to the restroom several times at night. He has decreased his alcohol intake. He has been trying to sleep 7 hours but usually wake up around 3 at night. He has tried CBD oil with no improvement. No other treatment tried. ? ? ?   ?  ?Objective:  ?Physical Exam: ?BP 132/86 (BP Location: Left Arm)   Pulse 68   Temp 98.3 ?F (36.8 ?C) (Temporal)   Ht 5' 9.25" (1.759 m)   Wt 157 lb 3.2 oz (71.3 kg)   SpO2 98%   BMI 23.05 kg/m?   ?Gen: No  acute distress, resting comfortably ?HEENT: TMs with clear effusion.  Nasal mucosa erythematous and boggy bilaterally with mild turbinate hypertrophy.  OP clear. ?CV: Regular rate and rhythm with no murmurs appreciated ?Pulm: Normal work of breathing, clear to auscultation bilaterally with no crackles, wheezes, or rhonchi ?Neuro: Grossly normal, moves all extremities ?Psych: Normal affect and thought content ? ?   ? ? ?I,Savera Zaman,acting as a Education administrator for Dimas Chyle, MD.,have documented all relevant documentation on the behalf of Dimas Chyle, MD,as directed by  Dimas Chyle, MD while in the presence of Dimas Chyle, MD.  ? ?I, Dimas Chyle, MD, have reviewed all documentation for this visit. The documentation on 10/18/21 for the exam, diagnosis, procedures, and orders are all accurate and complete. ? ?[name]. [name], MD ?10/18/2021 10:57 AM  ? ?

## 2021-10-18 NOTE — Patient Instructions (Addendum)
It was very nice to see you today! ? ?Please start the augmentin and prednisone. ? ?Try the trazodone to help with sleep.  ? ?Let me know in a few weeks how you are doing.  ? ?Take care, ?Dr [name] ? ?PLEASE NOTE: ? ?If you had any lab tests please let us know if you have not heard back within a few days. You may see your results on mychart before we have a chance to review them but we will give you a call once they are reviewed by [place]. If we ordered any referrals today, please let us know if you have not heard from their office within the next week.  ? ?Please try these tips to maintain a healthy lifestyle: ? ?Eat at least 3 REAL meals and 1-2 snacks per day.  Aim for no more than 5 hours between eating.  If you eat breakfast, please do so within one hour of getting up.  ? ?Each meal should contain half fruits/vegetables, one quarter protein, and one quarter carbs (no bigger than a computer mouse) ? ?Cut down on sweet beverages. This includes juice, soda, and sweet tea.  ? ?Drink at least 1 glass of water with each meal and aim for at least 8 glasses per day ? ?Exercise at least 150 minutes every week.   ?

## 2021-10-18 NOTE — Assessment & Plan Note (Signed)
Had lengthy discussion with patient regarding treatment options.  We will try trazodone 25 to 50 mg nightly as needed.  He will check with me in a few weeks via MyChart.  Discussed with patient prednisone may interfere with his sleep - he may defer starting medications until after finishing his course of prednisone.  ?

## 2021-10-18 NOTE — Assessment & Plan Note (Signed)
Currently manageable.  Does have a lot of anxiety around his insomnia.  We will be starting trazodone as above. ?

## 2021-10-30 ENCOUNTER — Encounter: Payer: Self-pay | Admitting: Family Medicine

## 2021-10-31 NOTE — Telephone Encounter (Signed)
I appreciate the update. Do not need to do anything else at this point. He can continue with the allergy medications and he should let us know if symptoms return. ? ?Charles [name]. [name], MD ?10/31/2021 8:07 AM  ? ?

## 2021-12-04 ENCOUNTER — Other Ambulatory Visit: Payer: Self-pay | Admitting: Internal Medicine

## 2021-12-19 ENCOUNTER — Other Ambulatory Visit: Payer: Self-pay | Admitting: Family Medicine

## 2021-12-20 MED ORDER — RIZATRIPTAN BENZOATE 5 MG PO TABS: 5.0000 mg | ORAL_TABLET | ORAL | 0 refills | Status: DC | PRN

## 2022-01-28 ENCOUNTER — Encounter: Payer: Self-pay | Admitting: Family Medicine

## 2022-01-29 NOTE — Telephone Encounter (Signed)
Left message to return call to our office at their convenience.   Patient need OV for CT referral

## 2022-02-01 NOTE — Telephone Encounter (Signed)
Called pt and OV was scheduled for July to see Charles [name] to get referral for CT

## 2022-02-25 ENCOUNTER — Ambulatory Visit (INDEPENDENT_AMBULATORY_CARE_PROVIDER_SITE_OTHER): Payer: 59 | Admitting: Family Medicine

## 2022-02-25 ENCOUNTER — Encounter: Payer: Self-pay | Admitting: Family Medicine

## 2022-02-25 VITALS — BP 120/80 | HR 89 | Temp 98.2°F | Ht 69.0 in | Wt 154.8 lb

## 2022-02-25 DIAGNOSIS — E785 Hyperlipidemia, unspecified: Secondary | ICD-10-CM

## 2022-02-25 DIAGNOSIS — G4700 Insomnia, unspecified: Secondary | ICD-10-CM

## 2022-02-25 NOTE — Progress Notes (Signed)
   Kyi Romanello is a 51 y.o. male who presents today for an office visit.  Assessment/Plan:  Chronic Problems Addressed Today: Dyslipidemia He had mildly elevated triglycerides on last lipid panel.  We will check cardiac CT scan per patient request.  Insomnia He has had mixed results with prednisone and is currently taking a break.  We discussed potential alternatives however he would like to defer for now.  He will check with me in a few weeks via MyChart if he would like to try an alternative medication.  May consider trial of hydroxyzine.  He has had previous poor reactions to gabapentin.     Subjective:  HPI:  See A/P for status of chronic conditions.  He is interested in getting a cardiac CT scan for screening.  He eats a balanced diet and stays very active with running.  He also feels like his trazodone is also effectiveness.  He is no longer taking this       Objective:  Physical Exam: BP 120/80   Pulse 89   Temp 98.2 F (36.8 C) (Temporal)   Ht 5' 9"  (1.753 m)   Wt 154 lb 12.8 oz (70.2 kg)   SpO2 97%   BMI 22.86 kg/m   Gen: No acute distress, resting comfortably CV: Regular rate and rhythm with no murmurs appreciated Pulm: Normal work of breathing, clear to auscultation bilaterally with no crackles, wheezes, or rhonchi Neuro: Grossly normal, moves all extremities Psych: Normal affect and thought content      Ivoree Felmlee M. [name], MD 02/25/2022 11:39 AM

## 2022-02-25 NOTE — Assessment & Plan Note (Signed)
He has had mixed results with prednisone and is currently taking a break.  We discussed potential alternatives however he would like to defer for now.  He will check with me in a few weeks via MyChart if he would like to try an alternative medication.  May consider trial of hydroxyzine.  He has had previous poor reactions to gabapentin.

## 2022-02-25 NOTE — Patient Instructions (Signed)
It was very nice to see you today!  We will order a cardiac CT scan.  Please let us know if you like to try any other medications to help with your sleep.  Take care, Dr [name]  PLEASE NOTE:  If you had any lab tests please let us know if you have not heard back within a few days. You may see your results on mychart before we have a chance to review them but we will give you a call once they are reviewed by [place]. If we ordered any referrals today, please let us know if you have not heard from their office within the next week.   Please try these tips to maintain a healthy lifestyle:  Eat at least 3 REAL meals and 1-2 snacks per day.  Aim for no more than 5 hours between eating.  If you eat breakfast, please do so within one hour of getting up.   Each meal should contain half fruits/vegetables, one quarter protein, and one quarter carbs (no bigger than a computer mouse)  Cut down on sweet beverages. This includes juice, soda, and sweet tea.   Drink at least 1 glass of water with each meal and aim for at least 8 glasses per day  Exercise at least 150 minutes every week.

## 2022-02-25 NOTE — Assessment & Plan Note (Signed)
He had mildly elevated triglycerides on last lipid panel.  We will check cardiac CT scan per patient request.

## 2022-04-12 ENCOUNTER — Telehealth (INDEPENDENT_AMBULATORY_CARE_PROVIDER_SITE_OTHER): Payer: 59 | Admitting: Family Medicine

## 2022-04-12 ENCOUNTER — Encounter: Payer: Self-pay | Admitting: Family Medicine

## 2022-04-12 VITALS — Ht 69.0 in | Wt 154.0 lb

## 2022-04-12 DIAGNOSIS — F411 Generalized anxiety disorder: Secondary | ICD-10-CM

## 2022-04-12 MED ORDER — DIAZEPAM 5 MG PO TABS: 5.0000 mg | ORAL_TABLET | Freq: Two times a day (BID) | ORAL | 1 refills | Status: DC | PRN

## 2022-04-12 MED ORDER — SERTRALINE HCL 50 MG PO TABS: 50.0000 mg | ORAL_TABLET | Freq: Every day | ORAL | 3 refills | Status: DC

## 2022-04-12 NOTE — Progress Notes (Signed)
   Hazel Wrinkle is a 51 y.o. male who presents today for a virtual office visit.  Assessment/Plan:  Chronic Problems Addressed Today: Anxiety state And lengthy discussion with patient regarding his anxiety.  Has significantly worsened over the last few months due to several life stressors.  He thinks he has been dealing with this for quite a while but is been able to cope until recently when it has become more overwhelming.  No reported SI or HI.  He will see his therapist next week.  We discussed medication options.  We will start Zoloft 50 mg daily and he will increase to 100 mg daily after 1 to 2 weeks.  We discussed potential side effects of Zoloft.  Given the acute nature of his anxiety we will start Valium 5 mg twice daily while the Zoloft becomes effective.  Ideally this would not be a long-term prescription.  We discussed potential side effects of Valium as well.  He will follow-up with me in a few weeks via MyChart and we can titrate the dose of medications as needed.     Subjective:  HPI:  See A/p for status of chronic conditions.         Objective/Observations  Physical Exam: Gen: NAD, resting comfortably Pulm: Normal work of breathing Neuro: Grossly normal, moves all extremities Psych: Normal affect and thought content  Virtual Visit via Video   I connected with [name] on 04/12/22 at 11:40 AM EDT by a video enabled telemedicine application and verified that I am speaking with the correct person using two identifiers. The limitations of evaluation and management by telemedicine and the availability of in person appointments were discussed. The patient expressed understanding and agreed to proceed.   Patient location: Home Provider location: [place] participating in the virtual visit: Myself and Patient     [name]. [name], MD 04/12/2022 12:45 PM

## 2022-04-12 NOTE — Assessment & Plan Note (Signed)
And lengthy discussion with patient regarding his anxiety.  Has significantly worsened over the last few months due to several life stressors.  He thinks he has been dealing with this for quite a while but is been able to cope until recently when it has become more overwhelming.  No reported SI or HI.  He will see his therapist next week.  We discussed medication options.  We will start Zoloft 50 mg daily and he will increase to 100 mg daily after 1 to 2 weeks.  We discussed potential side effects of Zoloft.  Given the acute nature of his anxiety we will start Valium 5 mg twice daily while the Zoloft becomes effective.  Ideally this would not be a long-term prescription.  We discussed potential side effects of Valium as well.  He will follow-up with me in a few weeks via MyChart and we can titrate the dose of medications as needed.

## 2022-04-15 ENCOUNTER — Encounter: Payer: Self-pay | Admitting: Family Medicine

## 2022-04-15 NOTE — Telephone Encounter (Signed)
Please advise 

## 2022-04-16 NOTE — Telephone Encounter (Signed)
Ok to send in diclofenac 2% gel apply topically qid prn.  [name]. [name], MD 04/16/2022 7:33 AM

## 2022-04-19 ENCOUNTER — Other Ambulatory Visit: Payer: Self-pay | Admitting: *Deleted

## 2022-04-19 MED ORDER — DICLOFENAC SODIUM 2 % EX SOLN
2.0000 | Freq: Four times a day (QID) | CUTANEOUS | 0 refills | Status: DC
Start: 2022-04-19 — End: 2022-09-20

## 2022-04-19 NOTE — Telephone Encounter (Signed)
Rx send to pharmacy  

## 2022-04-25 ENCOUNTER — Encounter: Payer: Self-pay | Admitting: Internal Medicine

## 2022-04-26 ENCOUNTER — Ambulatory Visit
Admission: RE | Admit: 2022-04-26 | Discharge: 2022-04-26 | Disposition: A | Discharge status: Home / Self Care | Payer: No Typology Code available for payment source | Source: Ambulatory Visit | Attending: Family Medicine | Admitting: Family Medicine

## 2022-04-26 DIAGNOSIS — E785 Hyperlipidemia, unspecified: Secondary | ICD-10-CM

## 2022-04-29 NOTE — Progress Notes (Signed)
Please inform patient of the following:  His cardiac CT scan did show that he had some calcium buildup in one of the arteries in his heart.  This is not a cause for immediate or urgent concern however it does indicate that we need to be more aggressive with his risk factor modification.  Recommend referral to cardiology to discuss.  I also incidentally found that he may have a atypical infection in his lungs.  This is also not a cause for immediate concern however they recommended referral to pulmonology for further evaluation.  Please place both of these referrals.

## 2022-05-02 ENCOUNTER — Ambulatory Visit (INDEPENDENT_AMBULATORY_CARE_PROVIDER_SITE_OTHER): Payer: 59 | Admitting: Family Medicine

## 2022-05-02 ENCOUNTER — Encounter: Payer: Self-pay | Admitting: Family Medicine

## 2022-05-02 VITALS — BP 118/78 | HR 98 | Temp 98.2°F | Ht 69.0 in | Wt 154.0 lb

## 2022-05-02 DIAGNOSIS — R918 Other nonspecific abnormal finding of lung field: Secondary | ICD-10-CM

## 2022-05-02 DIAGNOSIS — F411 Generalized anxiety disorder: Secondary | ICD-10-CM

## 2022-05-02 DIAGNOSIS — R931 Abnormal findings on diagnostic imaging of heart and coronary circulation: Secondary | ICD-10-CM

## 2022-05-02 NOTE — Progress Notes (Signed)
Charles [name] is a 51 y.o. male who presents today for an office visit.  Assessment/Plan:  Chronic Problems Addressed Today: Anxiety state Patient is doing much better today compared to a few weeks ago.  He stopped the Zoloft due to side effects.  Would be reasonable to hold off on further SSRI at this point given that symptoms are much better controlled.  He will follow-up me in a few weeks.  Would consider trial of low-dose Celexa versus buspirone if this becomes an issue again.  Elevated coronary artery calcium score Had lengthy discussion with patient regarding his recent elevated coronary calcium score of 256 and his left anterior descending artery.  No calcifications elsewhere.  Patient does not currently have any exertional chest pain symptoms.  He is able to run his normal mileage without any chest pain or shortness of breath.  We did discuss trying to get his LDL less than 70.  He will come back in a few months and we can recheck his lipid panel at that time.  May consider starting a statin at this point.   We discussed potential for referral to cardiology however deferred for today.  Would consider repeat scan in 1-2 years.   Abnormal CT scan, lung Also had a lengthy discussion regarding his findings that was incidentally found on his recent CT scan concerning for possible MAI infection.  He has also discussed this with his father who is a pulmonologist.  He is not currently have any exertional symptoms.  No systemic symptoms either.  He has been able to run his normal distance without any issue.  It is possible this could be a sequela of the inflammatory episode he had 20 to 25 years ago.  We did discuss referral to pulmonology however will defer for now.  We will continue with watchful waiting.     Subjective:  HPI:  Patient here for follow-up.  He had a CT scan done 6 days ago for coronary artery calcium scoring.  Found to have a calcium score of 256 in his left anterior  descending artery.  He was also found to have calcified granuloma in the medial aspect of his left lower lobe with thickening of peribronchial vascular interstitium concerning for possible MAI infection.  Patient overall feels well.  He is able to run for several miles at a time that is normal exercise capacity.  No worsening chest pain or shortness of breath.  No fevers or chills.  No malaise.  No night sweats.  Patient does have a history of Crohn's disease and has had significant issues with systemic inflammation for several years.  He did have a bronchoscopy about 20 years ago for persistent cough which was negative but he did have findings of patchy inflammation in his lungs that was thought to be potentially related to his Crohn's disease.  We also started him zoloft a few weeks ago for anxiety. This caused him some significant side effects and he stopped after a few weeks.  Anxiety feels much better today than a few weeks ago.  Is been following up with his therapist.  This seems to be going well.  No reported SI or HI.       Objective:  Physical Exam: BP 118/78   Pulse 98   Temp 98.2 F (36.8 C) (Temporal)   Ht 5' 9"  (1.753 m)   Wt 154 lb (69.9 kg)   SpO2 99%   BMI 22.74 kg/m   Gen: No acute distress, resting  comfortably Neuro: Grossly normal, moves all extremities Psych: Normal affect and thought content  Time Spent: 45 minutes of total time was spent on the date of the encounter performing the following actions: chart review prior to seeing the patient, obtaining history, performing a medically necessary exam, counseling on the treatment plan, reviewing his recent CT scan, placing orders, and documenting in our EHR.        [name]. [name], MD 05/02/2022 2:51 PM

## 2022-05-02 NOTE — Assessment & Plan Note (Addendum)
Patient is doing much better today compared to a few weeks ago.  He stopped the Zoloft due to side effects.  Would be reasonable to hold off on further SSRI at this point given that symptoms are much better controlled.  He will follow-up me in a few weeks.  Would consider trial of low-dose Celexa versus buspirone if this becomes an issue again.

## 2022-05-02 NOTE — Assessment & Plan Note (Signed)
Had lengthy discussion with patient regarding his recent elevated coronary calcium score of 256 and his left anterior descending artery.  No calcifications elsewhere.  Patient does not currently have any exertional chest pain symptoms.  He is able to run his normal mileage without any chest pain or shortness of breath.  We did discuss trying to get his LDL less than 70.  He will come back in a few months and we can recheck his lipid panel at that time.  May consider starting a statin at this point.   We discussed potential for referral to cardiology however deferred for today.  Would consider repeat scan in 1-2 years.

## 2022-05-02 NOTE — Assessment & Plan Note (Signed)
Also had a lengthy discussion regarding his findings that was incidentally found on his recent CT scan concerning for possible MAI infection.  He has also discussed this with his father who is a pulmonologist.  He is not currently have any exertional symptoms.  No systemic symptoms either.  He has been able to run his normal distance without any issue.  It is possible this could be a sequela of the inflammatory episode he had 20 to 25 years ago.  We did discuss referral to pulmonology however will defer for now.  We will continue with watchful waiting.

## 2022-05-02 NOTE — Patient Instructions (Signed)
It was very nice to see you today!  I am glad that you are feeling better. Please let me know how you are doing in a few weeks.  We can consider trial of low-dose Celexa versus buspirone if your anxiety gets worse.  We can continue to watch for the findings we found on your CT scan.  We will recheck your labs in 3 months.  Take care, Dr [name]  PLEASE NOTE:  If you had any lab tests please let us know if you have not heard back within a few days. You may see your results on mychart before we have a chance to review them but we will give you a call once they are reviewed by [place]. If we ordered any referrals today, please let us know if you have not heard from their office within the next week.   Please try these tips to maintain a healthy lifestyle:  Eat at least 3 REAL meals and 1-2 snacks per day.  Aim for no more than 5 hours between eating.  If you eat breakfast, please do so within one hour of getting up.   Each meal should contain half fruits/vegetables, one quarter protein, and one quarter carbs (no bigger than a computer mouse)  Cut down on sweet beverages. This includes juice, soda, and sweet tea.   Drink at least 1 glass of water with each meal and aim for at least 8 glasses per day  Exercise at least 150 minutes every week.

## 2022-06-25 ENCOUNTER — Ambulatory Visit (INDEPENDENT_AMBULATORY_CARE_PROVIDER_SITE_OTHER): Payer: 59 | Admitting: Internal Medicine

## 2022-06-25 ENCOUNTER — Encounter: Payer: Self-pay | Admitting: Internal Medicine

## 2022-06-25 ENCOUNTER — Other Ambulatory Visit (HOSPITAL_COMMUNITY): Payer: Self-pay

## 2022-06-25 ENCOUNTER — Telehealth: Payer: Self-pay | Admitting: Pharmacy Technician

## 2022-06-25 VITALS — BP 122/68 | HR 83 | Ht 70.0 in | Wt 155.3 lb

## 2022-06-25 DIAGNOSIS — K50118 Crohn's disease of large intestine with other complication: Secondary | ICD-10-CM | POA: Diagnosis not present

## 2022-06-25 DIAGNOSIS — K91850 Pouchitis: Secondary | ICD-10-CM | POA: Diagnosis not present

## 2022-06-25 MED ORDER — CELECOXIB 100 MG PO CAPS: ORAL_CAPSULE | ORAL | 5 refills | Status: DC

## 2022-06-25 NOTE — Progress Notes (Signed)
Charles [name] 51 y.o. [birth year] [id number]  Assessment & Plan:   Encounter Diagnoses  Name Primary?   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  We will reassess with a sigmoidoscopy (pouchoscopy).   Celebrex is refilled today as he uses that for joint problems.  I agree with benign neglect on the lung findings as he likely has severe scarring and is very functional and athletic still.  Interestingly his exercise and athleticism can contribute to calcification in the coronary artery.  Studies have shown higher calcium scores in athletes and runners in particular I think, and the significance of that is not entirely clear.  Certainly he has many things in his favor that go against having significant coronary problems.  He will sort through this further with PCP.  I did recommend reducing carbohydrates to reduce triglyceride levels.   Further plans pending the above.   CC: Charles Barrack, MD    Subjective:   Chief Complaint: Follow-up of pouchitis  HPI 51 year old white man with a history of total colectomy for IBD that turned out to be Crohn's disease, ileoanal pull-through with recurrent pouchitis findings, and lung disease related to IBD that is in remission who presents for follow-up.  He was last seen in September 2022.  At that time he had low-level Humira antibodies and slightly subtherapeutic levels of infliximab.  He reports that in June of this year he stopped his Humira.  He continues to exercise well and trying to eat better and minimize alcohol consumption.  For a while he was taking athletic greens supplement but stopped due to the cost.  He had a very stressful time last year with work or this year with work and eventually left [employer].  He is now looking for a job though stress level is better since then.  He has a high coronary calcium score in the LAD  and is following up with primary care about that.  He had some lung findings on the coronary calcium scoring study that made the radiologist wonder about MAI infection but Charles [name] has a history of severe lung disease related to his IBD.  He also continues to exercise and run and hike and does not have any significant dyspnea problems.  He took the counsel of his father who is a retired pulmonary physician and elected not to work this up any further. Allergies  Allergen Reactions   Azathioprine Other (See Comments)    Serum sickness / acute hypersensitivity reaction with possible Acute pancreatitis   Mercaptopurine Nausea And Vomiting    Felt ill within 2 days of initiating therapy.   Current Meds  Medication Sig   Ascorbic Acid (VITAMIN C PO) Take 1 tablet by mouth daily.   budesonide-formoterol (SYMBICORT) 160-4.5 MCG/ACT inhaler Inhale 2 puffs into the lungs 2 (two) times daily as needed (shortness of breath, wheezing).   celecoxib (CELEBREX) 100 MG capsule TAKE 1 CAPSULE BY MOUTH 2 TIMES DAILY AS NEEDED   cholecalciferol (VITAMIN D) 1000 units tablet Take 1,000 Units by mouth daily.   Cyanocobalamin (VITAMIN B-12 PO) Take 1 tablet by mouth daily.   diazepam (VALIUM) 5 MG tablet Take 1 tablet (5 mg total) by mouth every 12 (twelve) hours as needed for anxiety.   diclofenac Sodium (PENNSAID) 2 % SOLN Apply 2 Pump (40 mg total) topically 4 (four) times daily.   HUMIRA PEN 40  MG/0.8ML PNKT INJECT 1 PEN UNDER THE SKIN EVERY 14 DAYS   rizatriptan (MAXALT) 5 MG tablet Take 1 tablet (5 mg total) by mouth as needed for migraine. May repeat in 2 hours if needed   traZODone (DESYREL) 50 MG tablet Take 0.5-1 tablets (25-50 mg total) by mouth at bedtime as needed for sleep.   Vitamin D, Ergocalciferol, (DRISDOL) 1.25 MG (50000 UNIT) CAPS capsule Take 1 capsule (50,000 Units total) by mouth every 7 (seven) days.   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   Drug-induced acute pancreatitis without infection or necrosis 02/28/2021   Dysplastic nevi    GERD (gastroesophageal reflux disease)    Herniated disc    History of proctitis 2006   IBD (inflammatory bowel disease)    Incisional hernia    Insomnia    Migraine headache    Morton's neuroma of right foot    Osteopenia    Pouchitis ([place]) 2006   Small bowel obstruction due to adhesions ([place]) 02/09/2015   Thyroid disease    Vitamin D deficiency    Past Surgical History:  Procedure Laterality Date   COLECTOMY     COLONOSCOPY W/ BIOPSIES  04/16/2006   crohn's colitis   FLEXIBLE SIGMOIDOSCOPY  2006 - 02/2014   ileocolitis, pouchitis.  with pouch ulcer.    FUNCTIONAL ENDOSCOPIC SINUS SURGERY  12/2003   partial ethmoidectomy.  Dr Wilburn Cornelia   ileoanal pull-through     LUMBAR LAMINECTOMY/DECOMPRESSION MICRODISCECTOMY Right 10/13/2015   Procedure: Right Lumbar four- five Microdiskectomy;  Surgeon: Erline Levine, MD;  Location: [facility];  Service: Neurosurgery;  Laterality: Right;  Right L4-5 Microdiskectomy   sacroilitis     SIGMOIDOSCOPY  01/2005   VENTRAL HERNIA REPAIR  06/2014   Social History   Social History Narrative   Father a Technical brewer in [place].   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   Review of Systems As per HPI  Objective:   Physical Exam @BP  122/68   Pulse 83   Ht 5' 10"  (1.778 m)   Wt 155 lb 4.8 oz (70.4 kg)   SpO2 97%   BMI 22.28 kg/m  @  General:  NAD Eyes:   anicteric Lungs:  clear Heart::  S1S2 no rubs, murmurs or gallops Abdomen:  soft and nontender, BS+, surgical scars Ext:   no edema, cyanosis or clubbing    Data Reviewed:

## 2022-06-25 NOTE — Patient Instructions (Signed)
You have been scheduled for a flexible sigmoidoscopy. Please follow the written instructions given to you at your visit today. If you use inhalers (even only as needed), please bring them with you on the day of your procedure.   We have sent the following medications to your pharmacy for you to pick up at your convenience: Celebrex   I appreciate the opportunity to care for you. Silvano Rusk, MD, [facility]

## 2022-06-25 NOTE — Telephone Encounter (Signed)
Patient Advocate Encounter  Received notification from [place] that prior authorization for CELECOXIB 100MG is required.   PA submitted on 11.7.23 Key [id number] Status is pending    Luciano Cutter, CPhT Patient Advocate Phone: [phone]

## 2022-06-26 ENCOUNTER — Other Ambulatory Visit (HOSPITAL_COMMUNITY): Payer: Self-pay

## 2022-06-26 NOTE — Telephone Encounter (Signed)
Patient Advocate Encounter  Prior Authorization for CELEXOCIB 100MG has been approved.    PA# [id number] SS Effective dates: 11.7.23 through 11.6.26  Kalena Mander B. CPhT P: [phone] F: [phone]

## 2022-06-28 ENCOUNTER — Encounter: Payer: 59 | Admitting: Family Medicine

## 2022-06-28 ENCOUNTER — Other Ambulatory Visit (HOSPITAL_COMMUNITY): Payer: Self-pay

## 2022-07-08 ENCOUNTER — Encounter: Payer: 59 | Admitting: Family Medicine

## 2022-07-15 ENCOUNTER — Ambulatory Visit (INDEPENDENT_AMBULATORY_CARE_PROVIDER_SITE_OTHER): Payer: 59 | Admitting: Family Medicine

## 2022-07-15 ENCOUNTER — Encounter: Payer: Self-pay | Admitting: Family Medicine

## 2022-07-15 VITALS — BP 120/72 | HR 89 | Temp 97.5°F | Ht 70.0 in | Wt 157.2 lb

## 2022-07-15 DIAGNOSIS — R739 Hyperglycemia, unspecified: Secondary | ICD-10-CM

## 2022-07-15 DIAGNOSIS — K50118 Crohn's disease of large intestine with other complication: Secondary | ICD-10-CM | POA: Diagnosis not present

## 2022-07-15 DIAGNOSIS — Z0001 Encounter for general adult medical examination with abnormal findings: Secondary | ICD-10-CM

## 2022-07-15 DIAGNOSIS — E559 Vitamin D deficiency, unspecified: Secondary | ICD-10-CM | POA: Diagnosis not present

## 2022-07-15 DIAGNOSIS — F411 Generalized anxiety disorder: Secondary | ICD-10-CM

## 2022-07-15 DIAGNOSIS — K91850 Pouchitis: Secondary | ICD-10-CM

## 2022-07-15 DIAGNOSIS — E538 Deficiency of other specified B group vitamins: Secondary | ICD-10-CM

## 2022-07-15 DIAGNOSIS — E785 Hyperlipidemia, unspecified: Secondary | ICD-10-CM

## 2022-07-15 NOTE — Assessment & Plan Note (Signed)
Follows with GI.  No longer on Humira.  No recent flares.

## 2022-07-15 NOTE — Assessment & Plan Note (Signed)
Check B12 

## 2022-07-15 NOTE — Assessment & Plan Note (Signed)
He has been under a lot of stress recently but has been managing reasonably well.  He was let go from work a few months ago after 18 years of service.  This has been a significant source of stress however his in the final stages of interviews for a new job.  He has plan to use as needed however has been using only sparingly.  Does not need refill today.

## 2022-07-15 NOTE — Patient Instructions (Signed)
It was very nice to see you today!  We will check blood work  Please keep up the great work with your diet and exercise.  We will see you back in a year for your next physical.  Come back sooner if needed.  Take care, Dr [name]  PLEASE NOTE:  If you had any lab tests please let us know if you have not heard back within a few days. You may see your results on mychart before we have a chance to review them but we will give you a call once they are reviewed by [place]. If we ordered any referrals today, please let us know if you have not heard from their office within the next week.   Please try these tips to maintain a healthy lifestyle:  Eat at least 3 REAL meals and 1-2 snacks per day.  Aim for no more than 5 hours between eating.  If you eat breakfast, please do so within one hour of getting up.   Each meal should contain half fruits/vegetables, one quarter protein, and one quarter carbs (no bigger than a computer mouse)  Cut down on sweet beverages. This includes juice, soda, and sweet tea.   Drink at least 1 glass of water with each meal and aim for at least 8 glasses per day  Exercise at least 150 minutes every week.    Preventive Care 5-52 Years Old, Male Preventive care refers to lifestyle choices and visits with your health care provider that can promote health and wellness. Preventive care visits are also called wellness exams. What can I expect for my preventive care visit? Counseling During your preventive care visit, your health care provider may ask about your: Medical history, including: Past medical problems. Family medical history. Current health, including: Emotional well-being. Home life and relationship well-being. Sexual activity. Lifestyle, including: Alcohol, nicotine or tobacco, and drug use. Access to firearms. Diet, exercise, and sleep habits. Safety issues such as seatbelt and bike helmet use. Sunscreen use. Work and work Statistician. Physical  exam Your health care provider will check your: Height and weight. These may be used to calculate your BMI (body mass index). BMI is a measurement that tells if you are at a healthy weight. Waist circumference. This measures the distance around your waistline. This measurement also tells if you are at a healthy weight and may help predict your risk of certain diseases, such as type 2 diabetes and high blood pressure. Heart rate and blood pressure. Body temperature. Skin for abnormal spots. What immunizations do I need?  Vaccines are usually given at various ages, according to a schedule. Your health care provider will recommend vaccines for you based on your age, medical history, and lifestyle or other factors, such as travel or where you work. What tests do I need? Screening Your health care provider may recommend screening tests for certain conditions. This may include: Lipid and cholesterol levels. Diabetes screening. This is done by checking your blood sugar (glucose) after you have not eaten for a while (fasting). Hepatitis B test. Hepatitis C test. HIV (human immunodeficiency virus) test. STI (sexually transmitted infection) testing, if you are at risk. Lung cancer screening. Prostate cancer screening. Colorectal cancer screening. Talk with your health care provider about your test results, treatment options, and if necessary, the need for more tests. Follow these instructions at home: Eating and drinking  Eat a diet that includes fresh fruits and vegetables, whole grains, lean protein, and low-fat dairy products. Take vitamin and mineral  supplements as recommended by your health care provider. Do not drink alcohol if your health care provider tells you not to drink. If you drink alcohol: Limit how much you have to 0-2 drinks a day. Know how much alcohol is in your drink. In the [place] one drink equals one 12 oz bottle of beer (355 mL), one 5 oz glass of wine (148 mL), or one 1  oz glass of hard liquor (44 mL). Lifestyle Brush your teeth every morning and night with fluoride toothpaste. Floss one time each day. Exercise for at least 30 minutes 5 or more days each week. Do not use any products that contain nicotine or tobacco. These products include cigarettes, chewing tobacco, and vaping devices, such as e-cigarettes. If you need help quitting, ask your health care provider. Do not use drugs. If you are sexually active, practice safe sex. Use a condom or other form of protection to prevent STIs. Take aspirin only as told by your health care provider. Make sure that you understand how much to take and what form to take. Work with your health care provider to find out whether it is safe and beneficial for you to take aspirin daily. Find healthy ways to manage stress, such as: Meditation, yoga, or listening to music. Journaling. Talking to a trusted person. Spending time with friends and family. Minimize exposure to UV radiation to reduce your risk of skin cancer. Safety Always wear your seat belt while driving or riding in a vehicle. Do not drive: If you have been drinking alcohol. Do not ride with someone who has been drinking. When you are tired or distracted. While texting. If you have been using any mind-altering substances or drugs. Wear a helmet and other protective equipment during sports activities. If you have firearms in your house, make sure you follow all gun safety procedures. What's next? Go to your health care provider once a year for an annual wellness visit. Ask your health care provider how often you should have your eyes and teeth checked. Stay up to date on all vaccines. This information is not intended to replace advice given to you by your health care provider. Make sure you discuss any questions you have with your health care provider. Document Revised: 01/31/2021 Document Reviewed: 01/31/2021 Elsevier Patient Education  [employer].

## 2022-07-15 NOTE — Assessment & Plan Note (Signed)
Check vitamin D. 

## 2022-07-15 NOTE — Progress Notes (Signed)
Chief Complaint:  Charles [name] is a 51 y.o. male who presents today for his annual comprehensive physical exam.    Assessment/Plan:  New/Acute Problems: Left Low Back Pain Likely muscular.  No red flags.  Paraspinal exam.  He can use his Celebrex.  Also recommend heating pad.  He will follow-up with sports medicine if not improving.  Chronic Problems Addressed Today: Anxiety state He has been under a lot of stress recently but has been managing reasonably well.  He was let go from work a few months ago after 18 years of service.  This has been a significant source of stress however his in the final stages of interviews for a new job.  He has plan to use as needed however has been using only sparingly.  Does not need refill today.  Vitamin D deficiency Check vitamin D.  Crohn's disease ([place]) Follows with GI.  No longer on Humira.  No recent flares.  Dyslipidemia Was found to have calcifications on recent cardiac CT scan.  We will check lipid panel.  Depending on results may need to start statin.  Low serum vitamin B12 Check B12.  Preventative Healthcare: He will come back for flu shot.  Check labs.  Up-to-date on cancer screening.  Patient Counseling(The following topics were reviewed and/or handout was given):  -Nutrition: Stressed importance of moderation in sodium/caffeine intake, saturated fat and cholesterol, caloric balance, sufficient intake of fresh fruits, vegetables, and fiber.  -Stressed the importance of regular exercise.   -Substance Abuse: Discussed cessation/primary prevention of tobacco, alcohol, or other drug use; driving or other dangerous activities under the influence; availability of treatment for abuse.   -Injury prevention: Discussed safety belts, safety helmets, smoke detector, smoking near bedding or upholstery.   -Sexuality: Discussed sexually transmitted diseases, partner selection, use of condoms, avoidance of unintended pregnancy and contracept we will  check blood work.  ive alternatives.   -Dental health: Discussed importance of regular tooth brushing, flossing, and dental visits.  -Health maintenance and immunizations reviewed. Please refer to Health maintenance section.  Return to care in 1 year for next preventative visit.     Subjective:  HPI:  He has no acute complaints today. See A/p for status of chronic conditions.   He has had some issue with left low back pain for the last several days.  Started after waking up.  Worse with certain motions.  No specific treatments tried.  Lifestyle Diet: Balanced. Plenty of fruits and vegetables.  Exercise: Exercise daily with walking and running.      07/15/2022    2:08 PM  Depression screen PHQ 2/9  Decreased Interest 0  Down, Depressed, Hopeless 0  PHQ - 2 Score 0    There are no preventive care reminders to display for this patient.   ROS: Per HPI, otherwise a complete review of systems was negative.   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   Drug-induced acute pancreatitis without infection or necrosis 02/28/2021   Dysplastic nevi    GERD (gastroesophageal reflux disease)    Herniated disc    History of proctitis 2006   IBD (inflammatory bowel disease)  Incisional hernia    Insomnia    Migraine headache    Morton's neuroma of right foot    Osteopenia    Pouchitis ([place]) 2006   Small bowel obstruction due to adhesions ([place]) 02/09/2015   Thyroid disease    Vitamin D deficiency    Patient Active Problem List   Diagnosis Date Noted   Low serum vitamin B12 07/15/2022   Abnormal CT scan, lung 05/02/2022   Elevated coronary artery calcium  score 05/02/2022   Insomnia 10/18/2021   Dyslipidemia 07/25/2021   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   Adverse effect of azathioprine 02/28/2021   Serum sickness due to drug 02/27/2021   Degenerative disc disease, lumbar 06/13/2020   Synovitis of right knee 04/26/2020   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   Allergic rhinitis 11/10/2018   GERD (gastroesophageal reflux disease) 09/25/2018   Anxiety state 07/05/2016   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  with pouch ulcer.    FUNCTIONAL ENDOSCOPIC SINUS SURGERY  12/2003   partial ethmoidectomy.  Dr Wilburn Cornelia   ileoanal pull-through     LUMBAR LAMINECTOMY/DECOMPRESSION MICRODISCECTOMY Right 10/13/2015   Procedure: Right Lumbar four- five Microdiskectomy;  Surgeon: Erline Levine, MD;  Location: [facility];  Service: Neurosurgery;  Laterality: Right;  Right L4-5 Microdiskectomy   sacroilitis     SIGMOIDOSCOPY  01/2005   VENTRAL HERNIA REPAIR  06/2014    Family History  Problem Relation Age of Onset   Ulcerative colitis Mother    Berenice Primas' disease Mother    Colon cancer Paternal Grandmother    Brain cancer Paternal Grandmother        mets from colon    Medications- reviewed and updated Current Outpatient Medications  Medication Sig Dispense Refill   Ascorbic Acid (VITAMIN C PO) Take 1 tablet by mouth daily.     budesonide-formoterol (SYMBICORT) 160-4.5 MCG/ACT inhaler Inhale 2 puffs into the lungs 2 (two) times daily as needed (shortness of  breath, wheezing).     celecoxib (CELEBREX) 100 MG capsule TAKE 1 CAPSULE BY MOUTH 2 TIMES DAILY AS NEEDED 60 capsule 5   cholecalciferol (VITAMIN D) 1000 units tablet Take 2,000 Units by mouth daily.     Cyanocobalamin (VITAMIN B-12 PO) Take 500 mcg by mouth daily.     diazepam (VALIUM) 5 MG tablet Take 1 tablet (5 mg total) by mouth every 12 (twelve) hours as needed for anxiety. 30 tablet 1   diclofenac Sodium (PENNSAID) 2 % SOLN Apply 2 Pump (40 mg total) topically 4 (four) times daily. 112 g 0   rizatriptan (MAXALT) 5 MG tablet Take 1 tablet (5 mg total) by mouth as needed for migraine. May repeat in 2 hours if needed 30 tablet 0   Vitamin D, Ergocalciferol, (DRISDOL) 1.25 MG (50000 UNIT) CAPS capsule Take 1 capsule (50,000 Units total) by mouth every 7 (seven) days. 12 capsule 0   No current facility-administered medications for this visit.    Allergies-reviewed and updated Allergies  Allergen Reactions   Azathioprine Other (See Comments)  Serum sickness / acute hypersensitivity reaction with possible Acute pancreatitis   Mercaptopurine Nausea And Vomiting    Felt ill within 2 days of initiating therapy.    Social History   Socioeconomic History   Marital status: Divorced    Spouse name: Not on file   Number of children: 2   Years of education: 16 at least   Highest education level: Not on file  Occupational History   Occupation: Associate Professor rep    Employer: [employer]  Tobacco Use   Smoking status: Never   Smokeless tobacco: Never  Vaping Use   Vaping Use: Never used  Substance and Sexual Activity   Alcohol use: Yes    Alcohol/week: 14.0 standard drinks of alcohol    Types: 14 Glasses of wine per week    Comment: 3-4 times/week   Drug use: No   Sexual activity: Yes  Other Topics Concern   Not on file  Social History Narrative   Father a pulmonologist in [place].   Married to Dr. 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 Wt Readings from Last 3 Encounters:  07/15/22 157 lb 3.2 oz (71.3 kg)  06/25/22 155 lb 4.8 oz (70.4 kg)  05/02/22 154 lb (69.9 kg)   Gen: NAD, resting comfortably HEENT: TMs normal bilaterally. OP clear. No thyromegaly noted.  CV: RRR with no murmurs appreciated Pulm: NWOB, CTAB with no crackles, wheezes, or rhonchi GI: Normal bowel sounds present. Soft, Nontender, Nondistended. MSK: no edema, cyanosis, or clubbing noted - back: No deformities.  Left lower lumbar paraspinal muscles tender to palpation.  Tender nodules noted. Skin: warm, dry Neuro: CN2-12 grossly intact. Strength 5/5 in upper and lower extremities. Reflexes symmetric and intact bilaterally.  Psych: Normal affect and thought content     Yovanny Coats M. [name], MD 07/15/2022 2:41 PM

## 2022-07-15 NOTE — Assessment & Plan Note (Signed)
Was found to have calcifications on recent cardiac CT scan.  We will check lipid panel.  Depending on results may need to start statin.

## 2022-07-22 ENCOUNTER — Other Ambulatory Visit (INDEPENDENT_AMBULATORY_CARE_PROVIDER_SITE_OTHER): Payer: 59

## 2022-07-22 DIAGNOSIS — E559 Vitamin D deficiency, unspecified: Secondary | ICD-10-CM

## 2022-07-22 DIAGNOSIS — Z0001 Encounter for general adult medical examination with abnormal findings: Secondary | ICD-10-CM | POA: Diagnosis not present

## 2022-07-22 DIAGNOSIS — Z23 Encounter for immunization: Secondary | ICD-10-CM

## 2022-07-22 DIAGNOSIS — F411 Generalized anxiety disorder: Secondary | ICD-10-CM

## 2022-07-22 DIAGNOSIS — E785 Hyperlipidemia, unspecified: Secondary | ICD-10-CM | POA: Diagnosis not present

## 2022-07-22 DIAGNOSIS — R739 Hyperglycemia, unspecified: Secondary | ICD-10-CM | POA: Diagnosis not present

## 2022-07-22 LAB — VITAMIN B12: Vitamin B-12: 681 pg/mL (ref 211–911)

## 2022-07-22 LAB — CBC
HCT: 42.3 % (ref 39.0–52.0)
Hemoglobin: 14.5 g/dL (ref 13.0–17.0)
MCHC: 34.2 g/dL (ref 30.0–36.0)
MCV: 89.8 fl (ref 78.0–100.0)
Platelets: 279 10*3/uL (ref 150.0–400.0)
RBC: 4.71 Mil/uL (ref 4.22–5.81)
RDW: 13.4 % (ref 11.5–15.5)
WBC: 6.4 10*3/uL (ref 4.0–10.5)

## 2022-07-22 LAB — LIPID PANEL
Cholesterol: 158 mg/dL (ref 0–200)
HDL: 52 mg/dL (ref >39.00)
LDL Cholesterol: 80 mg/dL (ref 0–99)
NonHDL: 106.49
Total CHOL/HDL Ratio: 3
Triglycerides: 134 mg/dL (ref 0.0–149.0)
VLDL: 26.8 mg/dL (ref 0.0–40.0)

## 2022-07-22 LAB — COMPREHENSIVE METABOLIC PANEL
ALT: 16 U/L (ref 0–53)
AST: 23 U/L (ref 0–37)
Albumin: 4.2 g/dL (ref 3.5–5.2)
Alkaline Phosphatase: 67 U/L (ref 39–117)
BUN: 12 mg/dL (ref 6–23)
CO2: 30 mEq/L (ref 19–32)
Calcium: 9.5 mg/dL (ref 8.4–10.5)
Chloride: 99 mEq/L (ref 96–112)
Creatinine, Ser: 1.02 mg/dL (ref 0.40–1.50)
GFR: 86.15 mL/min (ref >60.00)
Glucose, Bld: 95 mg/dL (ref 70–99)
Potassium: 4.1 mEq/L (ref 3.5–5.1)
Sodium: 137 mEq/L (ref 135–145)
Total Bilirubin: 1.8 mg/dL — ABNORMAL HIGH (ref 0.2–1.2)
Total Protein: 7.4 g/dL (ref 6.0–8.3)

## 2022-07-22 LAB — VITAMIN D 25 HYDROXY (VIT D DEFICIENCY, FRACTURES): VITD: 28.7 ng/mL — ABNORMAL LOW (ref 30.00–100.00)

## 2022-07-22 LAB — TSH: TSH: 2.19 u[IU]/mL (ref 0.35–5.50)

## 2022-07-22 LAB — HEMOGLOBIN A1C: Hgb A1c MFr Bld: 5.5 % (ref 4.6–6.5)

## 2022-07-23 NOTE — Progress Notes (Signed)
Please inform patient of the following:  Vitamin D a little low but stable compared to previous values.  All his other lab are stable.  He can continue with 50,000 IUs once weekly.  Please send a new prescription if needed.  We can recheck again in 6 to 12 months.

## 2022-07-31 ENCOUNTER — Other Ambulatory Visit: Payer: Self-pay | Admitting: *Deleted

## 2022-07-31 MED ORDER — VITAMIN D (ERGOCALCIFEROL) 1.25 MG (50000 UNIT) PO CAPS: 50000.0000 [IU] | ORAL_CAPSULE | ORAL | 0 refills | Status: DC

## 2022-08-02 ENCOUNTER — Encounter: Payer: Self-pay | Admitting: Internal Medicine

## 2022-08-02 ENCOUNTER — Ambulatory Visit (AMBULATORY_SURGERY_CENTER): Payer: 59 | Admitting: Internal Medicine

## 2022-08-02 VITALS — BP 107/75 | HR 57 | Temp 99.3°F | Resp 15

## 2022-08-02 DIAGNOSIS — K91850 Pouchitis: Secondary | ICD-10-CM

## 2022-08-02 DIAGNOSIS — K5010 Crohn's disease of large intestine without complications: Secondary | ICD-10-CM | POA: Diagnosis not present

## 2022-08-02 MED ORDER — SODIUM CHLORIDE 0.9 % IV SOLN: 500.0000 mL | INTRAVENOUS | Status: DC

## 2022-08-02 NOTE — Progress Notes (Signed)
Called to room to assist during endoscopic procedure.  Patient ID and intended procedure confirmed with present staff. Received instructions for my participation in the procedure from the performing physician.  

## 2022-08-02 NOTE — Patient Instructions (Addendum)
I think things look better - just a few tiny ulcers.  Biopsies taken to look at the microscopic level.  I think there is no need to restart therapy. I will be in touch with pathology results.  Resume prior diet and medications.  I appreciate the opportunity to care for you. Gatha Mayer, MD, FACG   YOU HAD AN ENDOSCOPIC PROCEDURE TODAY AT [facility]:   Refer to the procedure report that was given to you for any specific questions about what was found during the examination.  If the procedure report does not answer your questions, please call your gastroenterologist to clarify.  If you requested that your care partner not be given the details of your procedure findings, then the procedure report has been included in a sealed envelope for you to review at your convenience later.  YOU SHOULD EXPECT: Some feelings of bloating in the abdomen. Passage of more gas than usual.  Walking can help get rid of the air that was put into your GI tract during the procedure and reduce the bloating. If you had a lower endoscopy (such as a colonoscopy or flexible sigmoidoscopy) you may notice spotting of blood in your stool or on the toilet paper. If you underwent a bowel prep for your procedure, you may not have a normal bowel movement for a few days.  Please Note:  You might notice some irritation and congestion in your nose or some drainage.  This is from the oxygen used during your procedure.  There is no need for concern and it should clear up in a day or so.  SYMPTOMS TO REPORT IMMEDIATELY:  Following lower endoscopy (colonoscopy or flexible sigmoidoscopy):  Excessive amounts of blood in the stool  Significant tenderness or worsening of abdominal pains  Swelling of the abdomen that is new, acute  Fever of 100F or higher   For urgent or emergent issues, a gastroenterologist can be reached at any hour by calling [phone]. Do not use MyChart messaging for urgent concerns.     DIET:  We do recommend a small meal at first, but then you may proceed to your regular diet.  Drink plenty of fluids but you should avoid alcoholic beverages for 24 hours.  ACTIVITY:  You should plan to take it easy for the rest of today and you should NOT DRIVE or use heavy machinery until tomorrow (because of the sedation medicines used during the test).    FOLLOW UP: Our staff will call the number listed on your records the next business day following your procedure.  We will call around 7:15- 8:00 am to check on you and address any questions or concerns that you may have regarding the information given to you following your procedure. If we do not reach you, we will leave a message.     If any biopsies were taken you will be contacted by phone or by letter within the next 1-3 weeks.  Please call us at [phone] if you have not heard about the biopsies in 3 weeks.    SIGNATURES/CONFIDENTIALITY: You and/or your care partner have signed paperwork which will be entered into your electronic medical record.  These signatures attest to the fact that that the information above on your After Visit Summary has been reviewed and is understood.  Full responsibility of the confidentiality of this discharge information lies with you and/or your care-partner.

## 2022-08-02 NOTE — Op Note (Signed)
[place] Patient Name: Charles [name] Procedure Date: 08/02/2022 3:20 PM MRN: [id number] Endoscopist: Gatha Mayer , MD, [id number] Age: 51 Referring MD:  Date of Birth: Jun 07, 1971 Gender: Male Account #: [id number] Procedure:                Pouchoscopy Indications:              Follow-up of Crohn's disease of the colon, pouchitis Medicines:                Monitored Anesthesia Care Procedure:                Pre-Anesthesia Assessment:                           - Prior to the procedure, a History and Physical                            was performed, and patient medications and                            allergies were reviewed. The patient's tolerance of                            previous anesthesia was also reviewed. The risks                            and benefits of the procedure and the sedation                            options and risks were discussed with the patient.                            All questions were answered, and informed consent                            was obtained. Prior Anticoagulants: The patient has                            taken no anticoagulant or antiplatelet agents. ASA                            Grade Assessment: II - A patient with mild systemic                            disease. After reviewing the risks and benefits,                            the patient was deemed in satisfactory condition to                            undergo the procedure.                           After obtaining informed consent, the endoscope was  passed under direct vision. Throughout the                            procedure, the patient's blood pressure, pulse, and                            oxygen saturations were monitored continuously. The                            Endoscope was introduced through the anus and                            advanced to the the neo-terminal ileum. After                            obtaining  informed consent, the endoscope was                            passed under direct vision. Throughout the                            procedure, the patient's blood pressure, pulse, and                            oxygen saturations were monitored continuously.The                            procedure was performed without difficulty. The                            patient tolerated the procedure well. The quality                            of the bowel preparation was good. Scope In: 3:28:19 PM Scope Out: 3:32:20 PM Total Procedure Duration: 0 hours 4 minutes 1 second  Findings:                 Patient is status-post total colectomy with an                            ileal pouch-anal anastomosis.                           The perianal and digital rectal examinations were                            normal.                           A patchy area of ulcerated mucosa was found in the                            ileoanal pouch. Biopsies were taken with a cold  forceps for histology. Verification of patient                            identification for the specimen was done. Estimated                            blood loss was minimal.                           Biopsies were taken with a cold forceps in the                            terminal ileum for histology. Verification of                            patient identification for the specimen was done.                            Estimated blood loss was minimal.                           The retroflexed view of the distal rectum and anal                            verge was normal and showed no anal or rectal                            abnormalities. Complications:            No immediate complications. Estimated Blood Loss:     Estimated blood loss was minimal. Impression:               - Ulcerated mucosa in the ileoanal pouch. Biopsied.                            Only 3 diminutive ulcers - best exam in years - off                             Humira x 6 mos                           - The distal rectum and anal verge are normal on                            retroflexion view.                           - Biopsies were taken with a cold forceps for                            histology in the terminal ileum. Recommendation:           - Patient has a contact number available for  emergencies. The signs and symptoms of potential                            delayed complications were discussed with the                            patient. Return to normal activities tomorrow.                            Written discharge instructions were provided to the                            patient.                           - Resume previous diet.                           - Continue off Humira and observe Gatha Mayer, MD 08/02/2022 3:42:13 PM This report has been signed electronically.

## 2022-08-02 NOTE — Progress Notes (Signed)
[facility] History and Physical   Primary Care Physician:  Vivi Barrack, MD   Reason for Procedure:   Pouchitis f/u  Plan:    pouchoscopy     HPI: Charles [name] is a 51 y.o. 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   Drug-induced acute pancreatitis without infection or necrosis 02/28/2021   Dysplastic nevi    GERD (gastroesophageal reflux disease)    Herniated disc    History of proctitis 2006   IBD (inflammatory bowel disease)    Incisional hernia    Insomnia    Migraine headache    Morton's neuroma of right foot    Osteopenia    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  with pouch ulcer.    FUNCTIONAL ENDOSCOPIC SINUS SURGERY  12/2003   partial ethmoidectomy.  Dr Wilburn Cornelia   ileoanal pull-through     LUMBAR LAMINECTOMY/DECOMPRESSION MICRODISCECTOMY Right 10/13/2015   Procedure: Right Lumbar four- five Microdiskectomy;  Surgeon: Erline Levine, MD;  Location: [facility];  Service: Neurosurgery;  Laterality: Right;  Right L4-5 Microdiskectomy   sacroilitis     SIGMOIDOSCOPY  01/2005   VENTRAL HERNIA REPAIR  06/2014    Prior to Admission medications   Medication Sig Start Date End Date Taking? Authorizing Provider  cholecalciferol (VITAMIN D) 1000 units tablet Take 2,000 Units by mouth daily.   Yes [provider]  Cyanocobalamin (VITAMIN B-12 PO) Take 500 mcg by mouth daily.    Yes [provider]  diazepam (VALIUM) 5 MG tablet Take 1 tablet (5 mg total) by mouth every 12 (twelve) hours as needed for anxiety. 04/12/22  Yes Vivi Barrack, MD  diclofenac Sodium (PENNSAID) 2 % SOLN Apply 2 Pump (40 mg total) topically 4 (four) times daily. 04/19/22  Yes Vivi Barrack, MD  VENTOLIN HFA 108 [phone] Base) MCG/ACT inhaler  02/11/22  Yes [provider]  Ascorbic Acid (VITAMIN C PO) Take 1 tablet by mouth daily.    [provider]  betamethasone dipropionate 0.05 % cream Apply topically. 02/11/22   [provider]  budesonide-formoterol (SYMBICORT) 160-4.5 MCG/ACT inhaler Inhale 2 puffs into the lungs 2 (two) times daily as needed (shortness of breath, wheezing). 03/01/21   Aline August, MD  calcipotriene (DOVONOX) 0.005 % cream Apply topically. 02/11/22   [provider]  celecoxib (CELEBREX) 100 MG capsule TAKE 1 CAPSULE BY MOUTH 2 TIMES DAILY AS NEEDED 06/25/22   Gatha Mayer, MD  hydrocortisone 2.5 % cream Apply 1 Application topically 4 (four) times daily. 02/11/22   [provider]  rizatriptan (MAXALT) 5 MG tablet Take 1 tablet (5 mg total) by mouth as needed for migraine. May repeat in 2 hours if needed 12/20/21   Vivi Barrack, MD  Vitamin D, Ergocalciferol, (DRISDOL) 1.25 MG (50000 UNIT) CAPS capsule Take 1  capsule (50,000 Units total) by mouth every 7 (seven) days. 07/31/22   Vivi Barrack, MD    Current Outpatient Medications  Medication Sig Dispense Refill   cholecalciferol (VITAMIN D) 1000 units tablet Take 2,000 Units by mouth daily.     Cyanocobalamin (VITAMIN B-12 PO) Take 500 mcg by mouth daily.     diazepam (VALIUM) 5 MG tablet Take 1 tablet (5 mg total) by mouth every 12 (twelve) hours as needed for anxiety. 30 tablet 1   diclofenac Sodium (PENNSAID) 2 % SOLN Apply 2 Pump (40 mg total) topically 4 (four) times daily. 112 g 0   VENTOLIN HFA 108 (90 Base) MCG/ACT inhaler      Ascorbic Acid (VITAMIN C PO)  Take 1 tablet by mouth daily.     betamethasone dipropionate 0.05 % cream Apply topically.     budesonide-formoterol (SYMBICORT) 160-4.5 MCG/ACT inhaler Inhale 2 puffs into the lungs 2 (two) times daily as needed (shortness of breath, wheezing).     calcipotriene (DOVONOX) 0.005 % cream Apply topically.     celecoxib (CELEBREX) 100 MG capsule TAKE 1 CAPSULE BY MOUTH 2 TIMES DAILY AS NEEDED 60 capsule 5   hydrocortisone 2.5 % cream Apply 1 Application topically 4 (four) times daily.     rizatriptan (MAXALT) 5 MG tablet Take 1 tablet (5 mg total) by mouth as needed for migraine. May repeat in 2 hours if needed 30 tablet 0   Vitamin D, Ergocalciferol, (DRISDOL) 1.25 MG (50000 UNIT) CAPS capsule Take 1 capsule (50,000 Units total) by mouth every 7 (seven) days. 12 capsule 0   Current Facility-Administered Medications  Medication Dose Route Frequency Provider Last Rate Last Admin   0.9 %  sodium chloride infusion  500 mL Intravenous Continuous Gatha Mayer, MD        Allergies as of 08/02/2022 - Review Complete 08/02/2022  Allergen Reaction Noted   Azathioprine Other (See Comments) 02/28/2021   Mercaptopurine Nausea And Vomiting 02/17/2014    Family History  Problem Relation Age of Onset   Ulcerative colitis Mother    Graves' disease Mother    Colon cancer Paternal Grandmother    Brain cancer Paternal Grandmother        mets from colon   Esophageal cancer Neg Hx    Rectal cancer Neg Hx    Stomach cancer Neg Hx     Social History   Socioeconomic History   Marital status: Divorced    Spouse name: Not on file   Number of children: 2   Years of education: 16 at least   Highest education level: Not on file  Occupational History   Occupation: Surveyor, quantity: [employer]  Tobacco Use   Smoking status: Never   Smokeless tobacco: Never  Vaping Use   Vaping Use: Never used  Substance and Sexual Activity   Alcohol use: Yes    Alcohol/week: 14.0 standard  drinks of alcohol    Types: 14 Glasses of wine per week    Comment: 3-4 times/week   Drug use: No   Sexual activity: Yes  Other Topics Concern   Not on file  Social History Narrative   Father a pulmonologist in [place].   Married to Dr. 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  Physical Exam: Vital signs BP 109/60   Pulse 81   Temp 99.3 F (37.4 C) (Temporal)   SpO2 98%   General:   Alert,  Well-developed, well-nourished, pleasant and cooperative in NAD Lungs:  Clear throughout to auscultation.   Heart:  Regular rate and rhythm; no murmurs, clicks, rubs,  or gallops. Abdomen:  Soft, nontender and nondistended. Normal bowel sounds.   Neuro/Psych:  Alert and cooperative. Normal mood and affect. A and O x 3   @Shanara Schnieders  Simonne Maffucci, MD, [facility] [phone] (pager) 08/02/2022 3:14 PM@

## 2022-08-05 ENCOUNTER — Telehealth: Payer: Self-pay | Admitting: *Deleted

## 2022-08-05 NOTE — Telephone Encounter (Signed)
  Follow up Call-     08/02/2022    2:28 PM  Call back number  Post procedure Call Back phone  # [phone]  Permission to leave phone message Yes     Patient questions:  Do you have a fever, pain , or abdominal swelling? No. Pain Score  0 *  Have you tolerated food without any problems? Yes.    Have you been able to return to your normal activities? Yes.    Do you have any questions about your discharge instructions: Diet   No. Medications  No. Follow up visit  No.  Do you have questions or concerns about your Care? No.  Actions: * If pain score is 4 or above: No action needed, pain <4.

## 2022-08-20 ENCOUNTER — Telehealth: Payer: Self-pay | Admitting: Family Medicine

## 2022-08-20 NOTE — Telephone Encounter (Signed)
Spoke with patient stated taking Augmentin '10mg'$  and Prednisone for 10 Days  If no changes with schedule F/U OV

## 2022-08-20 NOTE — Telephone Encounter (Signed)
See PCP wihin 4hrs/ triaged on 08/18/2022. Patient did not go to UC.   Patient Name: Charles [name] Gender: Male DOB: [birth year] Age: 52 Y 9 M 30 D Return Phone Number: [phone] (Primary) Address: City/ State/ Zip: [place]  [address] Client [facility] [place] Client Site [facility] [place] Night Provider Dimas Chyle- MD Contact Type Call Who Is Calling Patient / Member / Family / Caregiver Call Type Triage / Clinical Relationship To Patient Self Return Phone Number [phone] (Primary) Chief Complaint Hearing Loss Reason for Call Symptomatic / Request for [place] states he has a sinus infection, and can hardly hear out of his right ear. Caller received call earlier. He is still in a lot of pain and could hardly sleep last night. [place] Not Listed [facility] Translation No Nurse Assessment Nurse: Quentin Cornwall, RN, Imari Date/Time (Eastern Time): 08/18/2022 7:43:11 AM Confirm and document reason for call. If symptomatic, describe symptoms. ---Caller states he has had a headache for the past 36 hours. he is unable to hear out of his right ear. He is having a lot of sinus pain and pressure that is causing swelling of his cheekbone. He has tried all OTC medications with no relief Does the patient have any new or worsening symptoms? ---Yes Will a triage be completed? ---Yes Related visit to physician within the last 2 weeks? ---No Does the PT have any chronic conditions? (i.e. diabetes, asthma, this includes High risk factors for pregnancy, etc.) ---Yes List chronic conditions. ---Crhon's Is this a behavioral health or substance abuse call? ---No Nurse: Quentin Cornwall, RN, Imari Date/Time (Eastern Time): 08/18/2022 7:48:38 AM Please select the assessment type ---Verbal order / New medication order Additional Documentation ---Request for Prednisone and Augmentin Does the client  directives allow for assistance with medications after hours? ---No PLEASE NOTE: All timestamps contained within this report are represented as [place] Standard Time. CONFIDENTIALTY NOTICE: This fax transmission is intended only for the addressee. It contains information that is legally privileged, confidential or otherwise protected from use or disclosure. If you are not the intended recipient, you are strictly prohibited from reviewing, disclosing, copying using or disseminating any of this information or taking any action in reliance on or regarding this information. If you have received this fax in error, please notify us immediately by telephone so that we can arrange for its return to [place]. Phone: [phone], Toll-Free: [phone], Fax: [phone] Page: 2 of 2 Call Id: [id number] Guidelines Guideline Title Affirmed Question Affirmed Notes Nurse Date/Time Eilene Ghazi Time) Sinus Pain or Congestion [1] SEVERE pain AND [2] not improved 2 hours after pain medicine Quentin Cornwall, RN, Imari 08/18/2022 7:45:45 AM Disp. Time Eilene Ghazi Time) Disposition Final User 08/18/2022 5:11:15 AM FINAL ATTEMPT MADE - message left Glennon Mac RN, [place] 08/18/2022 5:11:26 AM Send to RN Final Attempt Jimmye Norman, RN, [place] 08/18/2022 7:29:28 AM Send To Call Back Waiting For Nurse Orrin Brigham 08/18/2022 7:47:19 AM See HCP within 4 Hours (or PCP triage) Yes Quentin Cornwall, RN, Imari Final Disposition 08/18/2022 7:47:19 AM See HCP within 4 Hours (or PCP triage) Yes Quentin Cornwall, RN, Imari Caller Disagree/Comply Comply Caller Understands Yes PreDisposition Go to Urgent Care/Walk-In Clinic Care Advice Given Per Guideline SEE HCP (OR PCP TRIAGE) WITHIN 4 HOURS: * IF OFFICE WILL BE CLOSED AND NO PCP (PRIMARY CARE PROVIDER) SECOND-LEVEL TRIAGE: You need to be seen within the next 3 or 4 hours. A nearby Urgent Care Center [facility]) is often a  good source of care. Another choice is to go to the ED. Go sooner if you  become worse. CALL BACK IF: * You become worse CARE ADVICE given per Sinus Pain or Congestion (Adult) guideline. Referrals GO TO FACILITY UNDECIDED GO TO FACILITY OTHER - SPECIFY

## 2022-09-20 ENCOUNTER — Encounter (HOSPITAL_COMMUNITY): Payer: Self-pay | Admitting: Internal Medicine

## 2022-09-20 ENCOUNTER — Ambulatory Visit (HOSPITAL_COMMUNITY)
Admission: RE | Admit: 2022-09-20 | Discharge: 2022-09-20 | Disposition: A | Discharge status: Home / Self Care | Payer: 59 | Source: Ambulatory Visit | Attending: Internal Medicine | Admitting: Internal Medicine

## 2022-09-20 VITALS — BP 104/70 | HR 96 | Wt 152.6 lb

## 2022-09-20 DIAGNOSIS — R0602 Shortness of breath: Secondary | ICD-10-CM | POA: Insufficient documentation

## 2022-09-20 DIAGNOSIS — Z79899 Other long term (current) drug therapy: Secondary | ICD-10-CM | POA: Insufficient documentation

## 2022-09-20 DIAGNOSIS — R0609 Other forms of dyspnea: Secondary | ICD-10-CM | POA: Insufficient documentation

## 2022-09-20 DIAGNOSIS — I2510 Atherosclerotic heart disease of native coronary artery without angina pectoris: Secondary | ICD-10-CM | POA: Diagnosis not present

## 2022-09-20 DIAGNOSIS — E785 Hyperlipidemia, unspecified: Secondary | ICD-10-CM | POA: Diagnosis not present

## 2022-09-20 DIAGNOSIS — K5080 Crohn's disease of both small and large intestine without complications: Secondary | ICD-10-CM | POA: Insufficient documentation

## 2022-09-20 DIAGNOSIS — R079 Chest pain, unspecified: Secondary | ICD-10-CM | POA: Diagnosis not present

## 2022-09-20 MED ORDER — ROSUVASTATIN CALCIUM 5 MG PO TABS: 5.0000 mg | ORAL_TABLET | Freq: Every day | ORAL | 3 refills | Status: DC

## 2022-09-20 NOTE — Patient Instructions (Signed)
START Crestor 5 mg every Mon Wed and Fri.  Your physician has recommended that you have a cardiopulmonary stress test (CPX). CPX testing is a non-invasive measurement of heart and lung function. It replaces a traditional treadmill stress test. This type of test provides a tremendous amount of information that relates not only to your present condition but also for future outcomes. This test combines measurements of you ventilation, respiratory gas exchange in the lungs, electrocardiogram (EKG), blood pressure and physical response before, during, and following an exercise protocol.  Your physician has requested that you have cardiac CT. Cardiac computed tomography (CT) is a painless test that uses an x-ray machine to take clear, detailed pictures of your heart. For further information please visit [email or web address][email or web address]. Please follow instruction sheet as given.  ONCE APPROVED BY YOUR INSURANCE COMPANY YOU WILL  BE CALLED TO HAVE THE TEST ARRANGED.    Your physician recommends that you schedule a follow-up appointment in: 6 months ( August 2024)  ** please call the office in June to arrange your follow up appointment. **  If you have any questions or concerns before your next appointment please send [place] a message through [place] or call our office at [phone].    TO LEAVE A MESSAGE FOR THE NURSE SELECT OPTION 2, PLEASE LEAVE A MESSAGE INCLUDING: YOUR NAME DATE OF BIRTH CALL BACK NUMBER REASON FOR CALL**this is important as we prioritize the call backs  YOU WILL RECEIVE A CALL BACK THE SAME DAY AS LONG AS YOU CALL BEFORE 4:00 PM  At the [facility], you and your health needs are our priority. As part of our continuing mission to provide you with exceptional heart care, we have created designated Provider Care Teams. These Care Teams include your primary Cardiologist (physician) and Advanced Practice Providers (APPs- Physician Assistants and Nurse Practitioners) who all work  together to provide you with the care you need, when you need it.   You may see any of the following providers on your designated Care Team at your next follow up: Dr Glori Bickers Dr Loralie Champagne Dr. Roxana Hires, NP Lyda Jester, [place] [facility] [place], [place] Forestine Na, NP Audry Riles, PharmD   Please be sure to bring in all your medications bottles to every appointment.    Thank you for choosing [facility]

## 2022-09-20 NOTE — Progress Notes (Addendum)
CARDIOLOGY CLINIC CONSULT NOTE  Referring Physician: Vivi Barrack, MD Primary Care: Vivi Barrack, MD Primary Cardiologist: None  HPI:  Charles [name] is a 52 y/o male with h/o Crohn's disease x 20 years s/p emergency colectomy in 2004, HL who is referred by DR. Parker to discuss elevated coronary calcium score.   Has been a competitive runner for years. Says resting HRs have been high for past year with low HRV and feels he is not running as well. Feels he gets SOB more quickly. No CP. Still walking and hiking. Recently had coronary calcium scan with score of 256 in mid to distal LAD only. Recently under a lot of stress after losing his job.  Drinks a couple beers/day.   Lipid Panel     Component Value Date/Time   CHOL 158 07/22/2022 0820   TRIG 134.0 07/22/2022 0820   HDL 52.00 07/22/2022 0820   CHOLHDL 3 07/22/2022 0820   VLDL 26.8 07/22/2022 0820   LDLCALC 80 07/22/2022 0820   LDLDIRECT 77.0 07/23/2021 1352    FHx: No Fhx of CAD or SCD Father 8 (retired pulmonologist) - heathy Mother - UC and HTN Sister - healthy   Review of Systems: [y] = yes, '[ ]'$  = no   General: Weight gain '[ ]'$ ; Weight loss '[ ]'$ ; Anorexia '[ ]'$ ; Fatigue '[ ]'$ ; Fever '[ ]'$ ; Chills '[ ]'$ ; Weakness '[ ]'$   Cardiac: Chest pain/pressure '[ ]'$ ; Resting SOB '[ ]'$ ; Exertional SOB Blue.Reese ]; Orthopnea '[ ]'$ ; Pedal Edema '[ ]'$ ; Palpitations '[ ]'$ ; Syncope '[ ]'$ ; Presyncope '[ ]'$ ; Paroxysmal nocturnal dyspnea'[ ]'$   Pulmonary: Cough '[ ]'$ ; Wheezing'[ ]'$ ; Hemoptysis'[ ]'$ ; Sputum '[ ]'$ ; Snoring '[ ]'$   GI: Vomiting'[ ]'$ ; Dysphagia'[ ]'$ ; Melena'[ ]'$ ; Hematochezia '[ ]'$ ; Heartburn'[ ]'$ ; Abdominal pain '[ ]'$ ; Constipation '[ ]'$ ; Diarrhea '[ ]'$ ; BRBPR '[ ]'$   GU: Hematuria'[ ]'$ ; Dysuria '[ ]'$ ; Nocturia'[ ]'$   Vascular: Pain in legs with walking '[ ]'$ ; Pain in feet with lying flat '[ ]'$ ; Non-healing sores '[ ]'$ ; Stroke '[ ]'$ ; TIA '[ ]'$ ; Slurred speech '[ ]'$ ;  Neuro: Headaches'[ ]'$ ; Vertigo'[ ]'$ ; Seizures'[ ]'$ ; Paresthesias'[ ]'$ ;Blurred vision '[ ]'$ ; Diplopia '[ ]'$ ; Vision changes '[ ]'$   Ortho/Skin: Arthritis [ y];  Joint pain '[ ]'$ ; Muscle pain '[ ]'$ ; Joint swelling '[ ]'$ ; Back Pain '[ ]'$ ; Rash '[ ]'$   Psych: Depression'[ ]'$ ; Anxiety[y ]  Heme: Bleeding problems '[ ]'$ ; Clotting disorders '[ ]'$ ; Anemia '[ ]'$   Endocrine: Diabetes '[ ]'$ ; Thyroid dysfunction[y ]   Past Medical History:  Diagnosis Date   Allergy    Anxiety    Arthritis    Arthropathy    and scroilitis   Asthma    Blood transfusion without reported diagnosis    Chronic drug-induced interstitial lung disorders (HCC)    Crohn's ileocolitis ([place]) 08/29/2008   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     betamethasone dipropionate 0.05 % cream Apply topically.     budesonide-formoterol (SYMBICORT) 160-4.5 MCG/ACT inhaler Inhale 2 puffs into the lungs 2 (two) times daily as needed (shortness of breath, wheezing).     calcipotriene (DOVONOX) 0.005 % cream Apply topically.     celecoxib (CELEBREX) 100 MG capsule TAKE 1 CAPSULE BY MOUTH 2 TIMES DAILY AS NEEDED 60 capsule 5   cholecalciferol (VITAMIN D) 1000 units tablet Take 2,000 Units by mouth daily.     Cyanocobalamin (VITAMIN B-12 PO) Take 500 mcg by mouth daily.     diazepam (VALIUM) 5 MG tablet Take 1 tablet (5 mg total) by mouth every 12 (twelve) hours as needed for anxiety. 30 tablet 1   hydrocortisone 2.5 % cream Apply 1 Application topically 4 (four) times daily.     rizatriptan (MAXALT) 5 MG tablet Take 1 tablet (5 mg total) by mouth  as needed for migraine. May repeat in 2 hours if needed 30 tablet 0   VENTOLIN HFA 108 (90 Base) MCG/ACT inhaler      Vitamin D, Ergocalciferol, (DRISDOL) 1.25 MG (50000 UNIT) CAPS capsule Take 1 capsule (50,000 Units total) by mouth every 7 (seven) days. 12 capsule 0   No current facility-administered medications for this encounter.    Allergies  Allergen Reactions   Azathioprine Other (See Comments)    Serum sickness / acute hypersensitivity reaction with possible Acute pancreatitis   Mercaptopurine Nausea And Vomiting    Felt ill within 2 days of initiating therapy.      Social History   Socioeconomic History   Marital status: Divorced    Spouse name: Not on file   Number of children: 2   Years of education: 16 at least   Highest education level: Not on file  Occupational History   Occupation: Associate Professor rep    Employer: [employer]  Tobacco Use   Smoking status: Never   Smokeless tobacco: Never  Vaping Use   Vaping Use: Never used  Substance and Sexual Activity   Alcohol use: Yes    Alcohol/week: 14.0 standard drinks of alcohol    Types: 14 Glasses of wine per week    Comment: 3-4 times/week   Drug use: No   Sexual activity: Yes  Other Topics Concern   Not on file  Social History Narrative   Father a pulmonologist in [place].   Married to Dr. 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   + EtOH   No tobacco   No drugs   Social Determinants of Radio broadcast assistant Strain: Not on file  Food Insecurity: Not on file  Transportation Needs: Not on file  Physical Activity: Not on file  Stress: Not on file  Social Connections: Not on file  Intimate Partner Violence: Not on file     Family History  Problem Relation Age of Onset   Ulcerative colitis Mother    Berenice Primas' disease Mother    Colon cancer Paternal Grandmother    Brain cancer  Paternal Grandmother        mets from colon   Esophageal cancer Neg Hx    Rectal cancer Neg Hx    Stomach cancer Neg Hx     Vitals:   09/20/22 1307  BP: 104/70  Pulse: 96  SpO2: 98%  Weight: 69.2 kg (152  lb 9.6 oz)    PHYSICAL EXAM: General:  Well appearing. No respiratory difficulty HEENT: normal No xanthelasma Neck: supple. no JVD. Carotids 2+ bilat; no bruits. No lymphadenopathy or thryomegaly appreciated. Cor: PMI nondisplaced. Regular rate & rhythm. No rubs, gallops or murmurs. Lungs: clear Abdomen: soft, nontender, nondistended. No hepatosplenomegaly. No bruits or masses. Good bowel sounds + osotmy. Extremities: no cyanosis, clubbing, rash, edema Neuro: alert & oriented x 3, cranial nerves grossly intact. moves all 4 extremities w/o difficulty. Affect pleasant.  ECG: NSR 83 No ST-T wave abnormalities. Personally reviewed   ASSESSMENT & PLAN:  1. Coronary calcification in LAD with exertional dyspnea - with inflammatory bowel disease at high risk for progressive CAD - will need coronary CTA to further evaluate for obstructive CAD - start Crestor 5 MWF - will check CPX as well  2. Hyperlipidemia - LDL 80. Goal < 70 - start Crestor as above  Glori Bickers, MD  1:34 PM

## 2022-09-21 ENCOUNTER — Other Ambulatory Visit: Payer: Self-pay | Admitting: Family Medicine

## 2022-09-23 ENCOUNTER — Other Ambulatory Visit (HOSPITAL_COMMUNITY): Payer: Self-pay | Admitting: *Deleted

## 2022-09-27 ENCOUNTER — Telehealth (HOSPITAL_COMMUNITY): Payer: Self-pay | Admitting: *Deleted

## 2022-09-27 ENCOUNTER — Other Ambulatory Visit (HOSPITAL_COMMUNITY): Payer: Self-pay | Admitting: *Deleted

## 2022-09-27 DIAGNOSIS — R079 Chest pain, unspecified: Secondary | ICD-10-CM

## 2022-10-09 ENCOUNTER — Telehealth (HOSPITAL_COMMUNITY): Payer: Self-pay | Admitting: Emergency Medicine

## 2022-10-09 ENCOUNTER — Telehealth (HOSPITAL_COMMUNITY): Payer: Self-pay | Admitting: *Deleted

## 2022-10-09 NOTE — Telephone Encounter (Signed)
Patient returning call about his upcoming cardiac imaging study; pt verbalizes understanding of appt date/time, parking situation and where to check in, pre-test NPO status and medications ordered, and verified current allergies; name and call back number provided for further questions should they arise  Gordy Clement RN Navigator Cardiac [place] [facility] [phone] office [phone] cell . Patient to take 131m metoprolol tartrate two hours prior to his cardiac CT scan. He is aware to arrive at 8:30am.

## 2022-10-09 NOTE — Telephone Encounter (Signed)
Attempted to call patient regarding upcoming cardiac CT appointment. Left message on voicemail with name and callback number Marchia Bond RN Navigator Cardiac Imaging [facility] [phone] Office [phone] Cell  138m metoprolol tartrate sent to pharm on file

## 2022-10-10 ENCOUNTER — Ambulatory Visit (HOSPITAL_COMMUNITY)
Admission: RE | Admit: 2022-10-10 | Discharge: 2022-10-10 | Disposition: A | Discharge status: Home / Self Care | Payer: 59 | Source: Ambulatory Visit | Attending: Internal Medicine | Admitting: Internal Medicine

## 2022-10-10 ENCOUNTER — Other Ambulatory Visit (HOSPITAL_COMMUNITY): Payer: Self-pay | Admitting: Emergency Medicine

## 2022-10-10 DIAGNOSIS — I2510 Atherosclerotic heart disease of native coronary artery without angina pectoris: Secondary | ICD-10-CM | POA: Diagnosis not present

## 2022-10-10 DIAGNOSIS — R079 Chest pain, unspecified: Secondary | ICD-10-CM | POA: Diagnosis not present

## 2022-10-10 MED ORDER — NITROGLYCERIN 0.4 MG SL SUBL
SUBLINGUAL_TABLET | SUBLINGUAL | Status: AC
  Administered 2022-10-10: 0.8 mg via SUBLINGUAL
  Filled 2022-10-10: qty 2

## 2022-10-10 MED ORDER — IOHEXOL 350 MG/ML SOLN
100.0000 mL | Freq: Once | INTRAVENOUS | Status: AC | PRN
  Administered 2022-10-10: 100 mL via INTRAVENOUS

## 2022-10-10 MED ORDER — NITROGLYCERIN 0.4 MG SL SUBL: 0.8000 mg | SUBLINGUAL_TABLET | Freq: Once | SUBLINGUAL | Status: AC

## 2022-10-11 ENCOUNTER — Other Ambulatory Visit: Payer: Self-pay | Admitting: Cardiology

## 2022-10-11 ENCOUNTER — Encounter (HOSPITAL_COMMUNITY): Payer: 59

## 2022-10-11 ENCOUNTER — Ambulatory Visit (HOSPITAL_COMMUNITY)
Admission: RE | Admit: 2022-10-11 | Discharge: 2022-10-11 | Disposition: A | Discharge status: Home / Self Care | Payer: 59 | Source: Ambulatory Visit | Attending: Cardiology | Admitting: Cardiology

## 2022-10-11 DIAGNOSIS — R931 Abnormal findings on diagnostic imaging of heart and coronary circulation: Secondary | ICD-10-CM | POA: Diagnosis present

## 2022-10-14 ENCOUNTER — Ambulatory Visit (HOSPITAL_COMMUNITY): Payer: 59 | Attending: Internal Medicine

## 2022-10-14 DIAGNOSIS — R079 Chest pain, unspecified: Secondary | ICD-10-CM

## 2022-11-11 ENCOUNTER — Encounter: Payer: Self-pay | Admitting: Family Medicine

## 2022-11-12 NOTE — Telephone Encounter (Signed)
Ok to send in.  [name]. [name], MD 11/12/2022 8:32 PM

## 2022-11-13 ENCOUNTER — Other Ambulatory Visit: Payer: Self-pay | Admitting: *Deleted

## 2022-11-13 MED ORDER — BUDESONIDE-FORMOTEROL FUMARATE 160-4.5 MCG/ACT IN AERO: 2.0000 | INHALATION_SPRAY | RESPIRATORY_TRACT | 3 refills | Status: DC | PRN

## 2022-11-13 NOTE — Telephone Encounter (Signed)
Rx send to pharmacy  

## 2022-11-19 ENCOUNTER — Ambulatory Visit (INDEPENDENT_AMBULATORY_CARE_PROVIDER_SITE_OTHER): Payer: 59 | Admitting: Family Medicine

## 2022-11-19 ENCOUNTER — Encounter: Payer: Self-pay | Admitting: Family Medicine

## 2022-11-19 VITALS — BP 109/74 | HR 90 | Temp 99.8°F | Ht 70.0 in | Wt 153.2 lb

## 2022-11-19 DIAGNOSIS — F411 Generalized anxiety disorder: Secondary | ICD-10-CM

## 2022-11-19 DIAGNOSIS — J4520 Mild intermittent asthma, uncomplicated: Secondary | ICD-10-CM | POA: Diagnosis not present

## 2022-11-19 MED ORDER — MONTELUKAST SODIUM 10 MG PO TABS: 10.0000 mg | ORAL_TABLET | Freq: Every day | ORAL | 3 refills | Status: AC

## 2022-11-19 MED ORDER — DIAZEPAM 5 MG PO TABS: 5.0000 mg | ORAL_TABLET | Freq: Two times a day (BID) | ORAL | 1 refills | Status: DC | PRN

## 2022-11-19 MED ORDER — HYDROCODONE-ACETAMINOPHEN 5-325 MG PO TABS: 1.0000 | ORAL_TABLET | Freq: Four times a day (QID) | ORAL | 0 refills | Status: AC | PRN

## 2022-11-19 MED ORDER — BUDESONIDE 90 MCG/ACT IN AEPB: 2.0000 | INHALATION_SPRAY | Freq: Two times a day (BID) | RESPIRATORY_TRACT | 5 refills | Status: DC

## 2022-11-19 MED ORDER — AMOXICILLIN-POT CLAVULANATE 875-125 MG PO TABS: 1.0000 | ORAL_TABLET | Freq: Two times a day (BID) | ORAL | 0 refills | Status: DC

## 2022-11-19 MED ORDER — PREDNISONE 20 MG PO TABS: 20.0000 mg | ORAL_TABLET | Freq: Every day | ORAL | 0 refills | Status: DC

## 2022-11-19 NOTE — Assessment & Plan Note (Signed)
Currently on Valium very infrequently as needed.  Overall symptoms are improving though still does have some stress he is working through.  He does have some job interviews coming up which he is hopeful about.  Will refill his Valium today.  He is aware not to take this with the hydrocodone.

## 2022-11-19 NOTE — Progress Notes (Signed)
   Charles [name] is a 52 y.o. male who presents today for an office visit.  Assessment/Plan:  New/Acute Problems: Otitis Media No obvious signs of TM rupture today.  Will start Augmentin for 10 days.  Also start prednisone burst which should hopefully help with some underlying sinusitis and allergic rhinitis as well.  Encouraged hydration.  Given significant pain we will send a small supply of hydrocodone.  He is aware to not take this at the same time as his Valium.  He will let us know if not improving.  May need referral to ENT if not improving.   Low Back Pain Has been working with PT and will be seeing orthopedics soon.  Will be starting prednisone burst as above which should help some with this pain which may be coming from his SI joint.  Chronic Problems Addressed Today: Airway hyperreactivity Mild flare due to allergies and pollen.  He would like to switch to ICS only inhaler.  Per insurance coverage we will send in prescription for Pulmicort.  Will also be starting prednisone burst as above which should help.  Anxiety state Currently on Valium very infrequently as needed.  Overall symptoms are improving though still does have some stress he is working through.  He does have some job interviews coming up which he is hopeful about.  Will refill his Valium today.  He is aware not to take this with the hydrocodone.     Subjective:  HPI:  See A/p for status of chronic conditions.  Main concern today is cough and congestion. This started a few weeks ago with a flare up of his asthma. We sent in a prescription for his symbicort but he has not yet been able to fill this yet.   A couple of days ago he started notcing more pain and ringing in his right ear. Started having more oozing from that ear as well. Started having a fever the last couple of days as well.  Has never had ear infections in the past.  Pain has been very severe and has brought him to tears a few times.  He also injured his  SI joint a few weeks ago. He has been following with PT for this. Thinkgs that he flared it up while wearing a heavy backpack during a hike. He will be seeing [name] soon.        Objective:  Physical Exam: BP 109/74   Pulse 90   Temp 99.8 F (37.7 C) (Temporal)   Ht 5\' 10"  (1.778 m)   Wt 153 lb 3.2 oz (69.5 kg)   SpO2 98%   BMI 21.98 kg/m   Gen: No acute distress, resting comfortably HEENT: Left TM clear.  Right TM erythematous and bulging.  Purulent debris present in right EAC.  Very tender to palpation CV: Regular rate and rhythm with no murmurs appreciated Pulm: Normal work of breathing, clear to auscultation bilaterally with no crackles, wheezes, or rhonchi Neuro: Grossly normal, moves all extremities Psych: Normal affect and thought content      Lonnetta Kniskern M. [name], MD 11/19/2022 12:49 PM

## 2022-11-19 NOTE — Patient Instructions (Signed)
It was very nice to see you today!  You have an ear infection.  Please start the Augmentin and prednisone.  This will help some with your SI joint pain as well as your breathing.  I will send prescription in for Pulmicort to help with your breathing.  We will give you pain medications and refill your Valium as well.  Let us know if not improving over the next several days.  Take care, Dr [name]  PLEASE NOTE:  If you had any lab tests, please let us know if you have not heard back within a few days. You may see your results on mychart before we have a chance to review them but we will give you a call once they are reviewed by [place].   If we ordered any referrals today, please let us know if you have not heard from their office within the next week.   If you had any urgent prescriptions sent in today, please check with the pharmacy within an hour of our visit to make sure the prescription was transmitted appropriately.   Please try these tips to maintain a healthy lifestyle:  Eat at least 3 REAL meals and 1-2 snacks per day.  Aim for no more than 5 hours between eating.  If you eat breakfast, please do so within one hour of getting up.   Each meal should contain half fruits/vegetables, one quarter protein, and one quarter carbs (no bigger than a computer mouse)  Cut down on sweet beverages. This includes juice, soda, and sweet tea.   Drink at least 1 glass of water with each meal and aim for at least 8 glasses per day  Exercise at least 150 minutes every week.

## 2022-11-19 NOTE — Assessment & Plan Note (Signed)
Mild flare due to allergies and pollen.  He would like to switch to ICS only inhaler.  Per insurance coverage we will send in prescription for Pulmicort.  Will also be starting prednisone burst as above which should help.

## 2022-11-21 ENCOUNTER — Ambulatory Visit: Payer: 59 | Admitting: Family Medicine

## 2022-11-21 ENCOUNTER — Encounter: Payer: Self-pay | Admitting: Family Medicine

## 2022-11-22 ENCOUNTER — Other Ambulatory Visit: Payer: Self-pay | Admitting: *Deleted

## 2022-11-22 ENCOUNTER — Encounter: Payer: Self-pay | Admitting: Family Medicine

## 2022-11-22 MED ORDER — ALBUTEROL SULFATE HFA 108 (90 BASE) MCG/ACT IN AERS: 1.0000 | INHALATION_SPRAY | Freq: Four times a day (QID) | RESPIRATORY_TRACT | 2 refills | Status: DC | PRN

## 2022-11-22 MED ORDER — BUDESONIDE 90 MCG/ACT IN AEPB: 1.0000 | INHALATION_SPRAY | Freq: Two times a day (BID) | RESPIRATORY_TRACT | 5 refills | Status: DC

## 2022-11-22 NOTE — Telephone Encounter (Signed)
Spoke with patient, Rx send in  Will verified with pharmacy if Rx delivery properly

## 2022-11-22 NOTE — Telephone Encounter (Signed)
Ok to send in new rx with sig 1 puff twice daily as needed.  Katina Degree. [name], MD 11/22/2022 8:21 AM

## 2022-11-22 NOTE — Telephone Encounter (Signed)
Rx send to [facility] pharmacy  ?

## 2022-11-23 ENCOUNTER — Encounter: Payer: Self-pay | Admitting: Family Medicine

## 2022-11-25 ENCOUNTER — Other Ambulatory Visit: Payer: Self-pay | Admitting: *Deleted

## 2022-11-25 MED ORDER — BUDESONIDE 90 MCG/ACT IN AEPB: 1.0000 | INHALATION_SPRAY | Freq: Two times a day (BID) | RESPIRATORY_TRACT | 5 refills | Status: DC

## 2022-11-25 NOTE — Telephone Encounter (Signed)
PA (Key: [id number]) - [id number] Budesonide-Formoterol Fumarate 160-4.5MCG/ACT aerosol Send today  Waiting for determination

## 2022-11-26 NOTE — Telephone Encounter (Signed)
LVM with denial information  If any question please give [place] a call

## 2022-11-26 NOTE — Telephone Encounter (Signed)
Denied on April 8 Your PA request has been denied.

## 2023-01-10 ENCOUNTER — Other Ambulatory Visit: Payer: Self-pay | Admitting: Family Medicine

## 2023-01-14 ENCOUNTER — Encounter: Payer: Self-pay | Admitting: Family Medicine

## 2023-01-14 ENCOUNTER — Other Ambulatory Visit: Payer: Self-pay | Admitting: *Deleted

## 2023-01-14 NOTE — Telephone Encounter (Signed)
Ok to send in wixhela.  Katina Degree. [name], MD 01/14/2023 2:57 PM

## 2023-01-14 NOTE — Telephone Encounter (Signed)
See note

## 2023-01-15 ENCOUNTER — Other Ambulatory Visit: Payer: Self-pay | Admitting: *Deleted

## 2023-01-15 MED ORDER — FLUTICASONE-SALMETEROL 100-50 MCG/ACT IN AEPB: 1.0000 | INHALATION_SPRAY | Freq: Two times a day (BID) | RESPIRATORY_TRACT | 12 refills | Status: DC

## 2023-01-15 NOTE — Telephone Encounter (Signed)
Please advise dose for wixhela

## 2023-02-25 ENCOUNTER — Ambulatory Visit (INDEPENDENT_AMBULATORY_CARE_PROVIDER_SITE_OTHER): Payer: 59 | Admitting: Family Medicine

## 2023-02-25 ENCOUNTER — Encounter: Payer: Self-pay | Admitting: Family Medicine

## 2023-02-25 VITALS — BP 112/80 | HR 94 | Temp 98.0°F | Ht 70.0 in | Wt 151.6 lb

## 2023-02-25 DIAGNOSIS — K219 Gastro-esophageal reflux disease without esophagitis: Secondary | ICD-10-CM

## 2023-02-25 DIAGNOSIS — E538 Deficiency of other specified B group vitamins: Secondary | ICD-10-CM

## 2023-02-25 DIAGNOSIS — E559 Vitamin D deficiency, unspecified: Secondary | ICD-10-CM

## 2023-02-25 DIAGNOSIS — R5383 Other fatigue: Secondary | ICD-10-CM

## 2023-02-25 DIAGNOSIS — R053 Chronic cough: Secondary | ICD-10-CM | POA: Diagnosis not present

## 2023-02-25 LAB — CBC WITH DIFFERENTIAL/PLATELET
Basophils Absolute: 0.1 10*3/uL (ref 0.0–0.1)
Basophils Relative: 0.7 % (ref 0.0–3.0)
Eosinophils Absolute: 0.1 10*3/uL (ref 0.0–0.7)
Eosinophils Relative: 1.6 % (ref 0.0–5.0)
HCT: 42.9 % (ref 39.0–52.0)
Hemoglobin: 14.4 g/dL (ref 13.0–17.0)
Lymphocytes Relative: 15.8 % (ref 12.0–46.0)
Lymphs Abs: 1.2 10*3/uL (ref 0.7–4.0)
MCHC: 33.5 g/dL (ref 30.0–36.0)
MCV: 93.8 fl (ref 78.0–100.0)
Monocytes Absolute: 0.7 10*3/uL (ref 0.1–1.0)
Monocytes Relative: 9.4 % (ref 3.0–12.0)
Neutro Abs: 5.6 10*3/uL (ref 1.4–7.7)
Neutrophils Relative %: 72.5 % (ref 43.0–77.0)
Platelets: 285 10*3/uL (ref 150.0–400.0)
RBC: 4.57 Mil/uL (ref 4.22–5.81)
RDW: 13.9 % (ref 11.5–15.5)
WBC: 7.7 10*3/uL (ref 4.0–10.5)

## 2023-02-25 LAB — COMPREHENSIVE METABOLIC PANEL
ALT: 18 U/L (ref 0–53)
AST: 27 U/L (ref 0–37)
Albumin: 4 g/dL (ref 3.5–5.2)
Alkaline Phosphatase: 65 U/L (ref 39–117)
BUN: 11 mg/dL (ref 6–23)
CO2: 31 mEq/L (ref 19–32)
Calcium: 10 mg/dL (ref 8.4–10.5)
Chloride: 100 mEq/L (ref 96–112)
Creatinine, Ser: 0.97 mg/dL (ref 0.40–1.50)
GFR: 91.12 mL/min (ref >60.00)
Glucose, Bld: 78 mg/dL (ref 70–99)
Potassium: 4.6 mEq/L (ref 3.5–5.1)
Sodium: 138 mEq/L (ref 135–145)
Total Bilirubin: 1.5 mg/dL — ABNORMAL HIGH (ref 0.2–1.2)
Total Protein: 7 g/dL (ref 6.0–8.3)

## 2023-02-25 LAB — TESTOSTERONE: Testosterone: 692.63 ng/dL (ref 300.00–890.00)

## 2023-02-25 LAB — VITAMIN B12: Vitamin B-12: 1075 pg/mL — ABNORMAL HIGH (ref 211–911)

## 2023-02-25 LAB — VITAMIN D 25 HYDROXY (VIT D DEFICIENCY, FRACTURES): VITD: 27.82 ng/mL — ABNORMAL LOW (ref 30.00–100.00)

## 2023-02-25 MED ORDER — PREDNISONE 20 MG PO TABS: 20.0000 mg | ORAL_TABLET | Freq: Every day | ORAL | 0 refills | Status: DC

## 2023-02-25 MED ORDER — PANTOPRAZOLE SODIUM 40 MG PO TBEC: 40.0000 mg | DELAYED_RELEASE_TABLET | Freq: Every day | ORAL | 3 refills | Status: DC

## 2023-02-25 NOTE — Assessment & Plan Note (Signed)
Had a lengthy discussion with patient today regarding his chronic cough.    Differential at this point includes pulmonary manifestation of his IBD, sarcoidosis, pulmonary fibrosis, and underlying reactive airway disease.He did have PFTs as part of his cardiac workup a few months ago which was normal.  He has had modest improvement with inhaled corticosteroids however has had some issues with side effects including thrush with his Wixela.  Overall patient feels like symptoms are manageable and he is not sure if he wishes to pursue in depth work up at this point.  He deferred pulmonology referral today.  We did discuss potentially finding a root cause that may be reversible and he is agreeable to check high-resolution CT scan at this point to rule out some of the above.  Depending on results of above he will need to follow-up with pulmonology.  For his current flare would be reasonable to give a steroid burst.  Likely has some component of underlying reactive airway disease. He has done well with this for previous flares in the past which may indicate an underlying inflammatory issue.  He will not start the prednisone until he gets his high-resolution CT scan as above.  Will also be starting Protonix 40 mg daily to see if there is any interval silent reflux contributing.  He can follow-up with [place] in a few weeks.

## 2023-02-25 NOTE — Assessment & Plan Note (Signed)
Start Protonix 40 mg daily for the next few weeks to see if this helps with his cough.

## 2023-02-25 NOTE — Progress Notes (Signed)
   Zyshawn Eichinger is a 52 y.o. male who presents today for an office visit.  Assessment/Plan:  Chronic Problems Addressed Today: Chronic cough Had a lengthy discussion with patient today regarding his chronic cough.    Differential at this point includes pulmonary manifestation of his IBD, sarcoidosis, pulmonary fibrosis, and underlying reactive airway disease.He did have PFTs as part of his cardiac workup a few months ago which was normal.  He has had modest improvement with inhaled corticosteroids however has had some issues with side effects including thrush with his Wixela.  Overall patient feels like symptoms are manageable and he is not sure if he wishes to pursue in depth work up at this point.  He deferred pulmonology referral today.  We did discuss potentially finding a root cause that may be reversible and he is agreeable to check high-resolution CT scan at this point to rule out some of the above.  Depending on results of above he will need to follow-up with pulmonology.  For his current flare would be reasonable to give a steroid burst.  Likely has some component of underlying reactive airway disease. He has done well with this for previous flares in the past which may indicate an underlying inflammatory issue.  He will not start the prednisone until he gets his high-resolution CT scan as above.  Will also be starting Protonix 40 mg daily to see if there is any interval silent reflux contributing.  He can follow-up with [place] in a few weeks.  GERD (gastroesophageal reflux disease) Start Protonix 40 mg daily for the next few weeks to see if this helps with his cough.     Subjective:  HPI:  See Assessment / plan for status of chronic conditions.  His primary concern today is cough.  This has been going on intermittently for the last several months.  Flared up again several weeks ago.  We have been trying a few different inhalers to help with this.  Initially did try Symbicort however  insurance would not pay for this and we switched to Pulmicort.  We then switched to [facility].  This caused some issues with thrush.  He is not sure if this helped with his cough.  Initially cough was first thing in the morning but seems to be happening throughout the day now.  Cough is productive of white sputum.  He has been treating his allergies with daily Claritin, Flonase, and nasal lavage.  Does not feel like he has any sinus infection symptoms.  No noticeable postnasal drip.  He is also concerned that his reflux could be contributing to his chronic cough.  He started Prilosec and Pepcid at home however did not notice that this made much of a difference.       Objective:  Physical Exam: BP 112/80   Pulse 94   Temp 98 F (36.7 C) (Temporal)   Ht 5\' 10"  (1.778 m)   Wt 151 lb 9.6 oz (68.8 kg)   SpO2 98%   BMI 21.75 kg/m   Gen: No acute distress, resting comfortably CV: Regular rate and rhythm with no murmurs appreciated Pulm: Normal work of breathing, occasional and expiratory wheezes at bilateral bases otherwise clear to auscultation bilaterally. Neuro: Grossly normal, moves all extremities Psych: Normal affect and thought content      Equan Cogbill M. [name], MD 02/25/2023 10:10 AM

## 2023-02-25 NOTE — Patient Instructions (Addendum)
It was very nice to see you today!  We will check his T scan.  Will check blood work today.  Please start the prednisone and Protonix.  Return if symptoms worsen or fail to improve.   Take care, Dr [name]  PLEASE NOTE:  If you had any lab tests, please let us know if you have not heard back within a few days. You may see your results on mychart before we have a chance to review them but we will give you a call once they are reviewed by [place].   If we ordered any referrals today, please let us know if you have not heard from their office within the next week.   If you had any urgent prescriptions sent in today, please check with the pharmacy within an hour of our visit to make sure the prescription was transmitted appropriately.   Please try these tips to maintain a healthy lifestyle:  Eat at least 3 REAL meals and 1-2 snacks per day.  Aim for no more than 5 hours between eating.  If you eat breakfast, please do so within one hour of getting up.   Each meal should contain half fruits/vegetables, one quarter protein, and one quarter carbs (no bigger than a computer mouse)  Cut down on sweet beverages. This includes juice, soda, and sweet tea.   Drink at least 1 glass of water with each meal and aim for at least 8 glasses per day  Exercise at least 150 minutes every week.

## 2023-02-26 NOTE — Progress Notes (Signed)
Vitamin D is a bit low but stable compared to previous.  He should continue with his B12 and vitamin D supplementation.  The rest of his labs are all stable.  It looks like his TSH was not released - can we check with the lab to make sure this gets added on?  Do not need to make any changes to treatment plan based on his labs thus far.  We will contact him once we get results back from his CT scan.

## 2023-02-27 ENCOUNTER — Other Ambulatory Visit (INDEPENDENT_AMBULATORY_CARE_PROVIDER_SITE_OTHER): Payer: 59

## 2023-02-27 ENCOUNTER — Other Ambulatory Visit: Payer: 59

## 2023-02-27 DIAGNOSIS — R5383 Other fatigue: Secondary | ICD-10-CM | POA: Diagnosis not present

## 2023-02-27 LAB — TSH: TSH: 2.14 u[IU]/mL (ref 0.35–5.50)

## 2023-02-27 NOTE — Addendum Note (Signed)
Addended by: Sumner Boast on: 02/27/2023 03:21 PM   Modules accepted: Orders

## 2023-02-28 NOTE — Progress Notes (Signed)
Thyroid level is normal.  Charles [name] M. [name], MD 02/28/2023 9:10 AM

## 2023-03-05 ENCOUNTER — Ambulatory Visit (HOSPITAL_BASED_OUTPATIENT_CLINIC_OR_DEPARTMENT_OTHER)
Admission: RE | Admit: 2023-03-05 | Discharge: 2023-03-05 | Disposition: A | Discharge status: Home / Self Care | Payer: 59 | Source: Ambulatory Visit | Attending: Family Medicine | Admitting: Family Medicine

## 2023-03-05 DIAGNOSIS — R053 Chronic cough: Secondary | ICD-10-CM | POA: Diagnosis present

## 2023-03-06 ENCOUNTER — Encounter: Payer: Self-pay | Admitting: Family Medicine

## 2023-03-06 NOTE — Progress Notes (Signed)
His Ct scan shows several increased nodules. Radiology is concerned it may be sarcoid. I think having him see a pulmonologist is a good idea.   Please place referral if he is agreeable.  Katina Degree. [name], MD 03/06/2023 11:43 PM

## 2023-03-10 ENCOUNTER — Other Ambulatory Visit: Payer: Self-pay

## 2023-03-10 DIAGNOSIS — R918 Other nonspecific abnormal finding of lung field: Secondary | ICD-10-CM

## 2023-03-10 MED ORDER — PREDNISONE 20 MG PO TABS: 20.0000 mg | ORAL_TABLET | Freq: Every day | ORAL | 0 refills | Status: DC

## 2023-03-10 NOTE — Telephone Encounter (Signed)
Referral has been placed. 

## 2023-03-12 ENCOUNTER — Other Ambulatory Visit: Payer: Self-pay | Admitting: *Deleted

## 2023-03-12 DIAGNOSIS — R918 Other nonspecific abnormal finding of lung field: Secondary | ICD-10-CM

## 2023-03-17 ENCOUNTER — Encounter: Payer: Self-pay | Admitting: Family Medicine

## 2023-03-18 MED ORDER — PREDNISONE 5 MG PO TABS: ORAL_TABLET | ORAL | 0 refills | Status: DC

## 2023-04-24 ENCOUNTER — Ambulatory Visit (INDEPENDENT_AMBULATORY_CARE_PROVIDER_SITE_OTHER): Payer: 59 | Admitting: Pulmonary Disease

## 2023-04-24 ENCOUNTER — Encounter (HOSPITAL_BASED_OUTPATIENT_CLINIC_OR_DEPARTMENT_OTHER): Payer: Self-pay | Admitting: Pulmonary Disease

## 2023-04-24 VITALS — BP 118/72 | HR 90 | Ht 70.0 in | Wt 150.0 lb

## 2023-04-24 DIAGNOSIS — R590 Localized enlarged lymph nodes: Secondary | ICD-10-CM | POA: Diagnosis not present

## 2023-04-24 DIAGNOSIS — J4550 Severe persistent asthma, uncomplicated: Secondary | ICD-10-CM

## 2023-04-24 NOTE — Progress Notes (Unsigned)
Subjective:   PATIENT ID: Charles [name] GENDER: male DOB: 07/23/1971, MRN: [id number]  Chief Complaint  Patient presents with   Establish Care    Reason for Visit: New consult for sarcoidosis  [name] is a 52 year old male never smoker with mild asthma, UC/IBS/pouchitis s/p colectomy in 2004, chronic polypoid sinusitis s/p sinus surgery 2005, spondyloarthropathy, hx of remicaide and humira who presents to establish care for his sarcoidosis.  Preceding his respiratory symptoms, he was diagnosed with idiopathic IBS after presented with abdominal cramping and diarrhea in 08/2002. He underwent colectomy in 02/2003 and requiring immunosuppressive therapy including prednisone. 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  In 2005 he developed severe coughing and seen by ENT due to unresponsiveness to conservative therapy. Did receive steroids which would suppress symptoms but would recur of medication so ended up being on prednisone 35-40 mg chronically. Underwent  bronchoscopy at [facility] in [place], [place] on 03/20/2004 for chronic cough with specimens from right mainstem and trachea described as "nodular inflammatory lesions". Per surgical path report final diagnosis: "Respiratory mucosa with squamous metaplasia and severe acute and chronic inflammation. [place] red special stain for amyloidosis: Negative." Path sent to [facility] considering that these findings could represent a manifestation of his underlying idiopathic inflammatory bowel disease. [facility] final read: "Lung, right mainstem, bronchus and tracheo, bronchoscopic biopsies: Severe acute and chronic inflammation with squamous metaplasia consistent with idiopathic inflammatory bowel disease-related airway disease"   Asthma was initially diagnosed in 1995 and  is usually triggered by pollen in the spring. Intermittently uses steroid inhaler (Advair 100, Qvar 40 tried and formoterol) during this time. Developed some recent post-nasal drainage. Had  developed a hacking cough with thick clear sputum. Has been using Wixela with some improvement but developed thrush. No shortness of breath. Has been on prednisone for 2-3 months. Started on prednisone 20 mg 1-2 weeks ago and tapered over this tim. Currently on 2.5 mg daily   He recently had a CT Cardiac scoring due to desire to work himself up since he turned 52. Was seen by Had CPET completed and normal. Fatigue has been years with thoughts this was related to his autoimmune.   He reports some sort of rash on left knee and periodically had skin rashes over the years.   Has been off Humira June 2023. Previously on remicaide.  Enjoys running 2-3 days a week.   Family member with sarcoid, maternal side Aunt with tracheobronchial amyloidosis  Social History: Runs with Jeronimo Greaves and Iverson Alamin   I have personally reviewed patient's past medical/family/social history, allergies, current medications.  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   Drug-induced acute pancreatitis without infection or necrosis 02/28/2021   Dysplastic nevi    GERD (gastroesophageal reflux disease)    Herniated disc    History of proctitis 2006   IBD (inflammatory bowel disease)    Incisional hernia    Insomnia    Migraine headache    Morton's neuroma of right foot    Osteopenia    Pouchitis (HCC) 2006   Small bowel obstruction due to adhesions (HCC) 02/09/2015   Thyroid disease    Vitamin D deficiency  Family History  Problem Relation Age of Onset   Ulcerative colitis Mother    Graves' disease Mother    Colon cancer Paternal Grandmother    Brain cancer Paternal Grandmother        mets from colon   Esophageal cancer Neg Hx     Rectal cancer Neg Hx    Stomach cancer Neg Hx      Social History   Occupational History   Occupation: Nature conservation officer: [employer]  Tobacco Use   Smoking status: Never   Smokeless tobacco: Never  Vaping Use   Vaping status: Never Used  Substance and Sexual Activity   Alcohol use: Yes    Alcohol/week: 14.0 standard drinks of alcohol    Types: 14 Glasses of wine per week    Comment: 3-4 times/week   Drug use: No   Sexual activity: Yes    Allergies  Allergen Reactions   Azathioprine Other (See Comments)    Serum sickness / acute hypersensitivity reaction with possible Acute pancreatitis   Mercaptopurine Nausea And Vomiting    Felt ill within 2 days of initiating therapy.     Outpatient Medications Prior to Visit  Medication Sig Dispense Refill   albuterol (VENTOLIN HFA) 108 (90 Base) MCG/ACT inhaler Inhale 1 puff into the lungs every 6 (six) hours as needed for wheezing or shortness of breath. 8 g 2   Ascorbic Acid (VITAMIN C PO) Take 1 tablet by mouth daily.     betamethasone dipropionate 0.05 % cream Apply topically.     Budesonide 90 MCG/ACT inhaler Inhale 1 puff into the lungs 2 (two) times daily. for wheezing or shortness of breath. 1 each 5   calcipotriene (DOVONOX) 0.005 % cream Apply topically.     celecoxib (CELEBREX) 100 MG capsule TAKE 1 CAPSULE BY MOUTH 2 TIMES DAILY AS NEEDED 60 capsule 5   cholecalciferol (VITAMIN D) 1000 units tablet Take 2,000 Units by mouth daily.     Cyanocobalamin (VITAMIN B-12 PO) Take 500 mcg by mouth daily.     diazepam (VALIUM) 5 MG tablet Take 1 tablet (5 mg total) by mouth every 12 (twelve) hours as needed for anxiety. 30 tablet 1   fluticasone-salmeterol (WIXELA INHUB) 100-50 MCG/ACT AEPB Inhale 1 puff into the lungs 2 (two) times daily. 60 each 12   hydrocortisone 2.5 % cream Apply 1 Application topically 4 (four) times daily.     montelukast (SINGULAIR) 10 MG tablet Take 1 tablet (10 mg total) by mouth at  bedtime. 30 tablet 3   pantoprazole (PROTONIX) 40 MG tablet Take 1 tablet (40 mg total) by mouth daily. 30 tablet 3   predniSONE (DELTASONE) 5 MG tablet Take 20 mg total daily for 2 weeks, then 15 mg daily for 2 weeks, then 10 mg daily for 2 weeks, then 5 mg daily. (Patient taking differently: Take 2.5 mg by mouth daily with breakfast.) 100 tablet 0   rizatriptan (MAXALT) 5 MG tablet Take 1 tablet (5 mg total) by mouth as needed for migraine. May repeat in 2 hours if needed 30 tablet 0   rosuvastatin (CRESTOR) 5 MG tablet Take 1 tablet (5 mg total) by mouth daily. 90 tablet 3   traZODone (DESYREL) 50 MG tablet TAKE 1/2 TO 1 TABLET(25 TO 50 MG) BY MOUTH AT BEDTIME AS NEEDED FOR SLEEP 30 tablet 3   amoxicillin-clavulanate (AUGMENTIN) 875-125 MG tablet Take 1 tablet by mouth 2 (two) times daily. 20 tablet 0   No  facility-administered medications prior to visit.    Review of Systems  Constitutional:  Negative for chills, diaphoresis, fever, malaise/fatigue and weight loss.  HENT:  Positive for congestion and ear pain.   Respiratory:  Positive for cough and sputum production. Negative for hemoptysis, shortness of breath and wheezing.   Cardiovascular:  Negative for chest pain, palpitations and leg swelling.  Gastrointestinal:  Positive for heartburn.  Musculoskeletal:  Positive for joint pain (Occasionally, very active).     Objective:   Vitals:   04/24/23 1308  BP: 118/72  Pulse: 90  SpO2: 99%  Weight: 150 lb (68 kg)  Height: 5\' 10"  (1.778 m)   SpO2: 99 %  Physical Exam: General: Well-appearing, no acute distress HENT: [place], AT Eyes: EOMI, no scleral icterus Respiratory: Clear to auscultation bilaterally.  No crackles, wheezing or rales Cardiovascular: RRR, -M/R/G, no JVD Extremities:-Edema,-tenderness Neuro: AAO x4, CNII-XII grossly intact Psych: Normal mood, normal affect  Data Reviewed:  Imaging: CT Chest 03/05/23 - mediastinal and hilar lymph nodes with perilymphatic  pulmonary parenchyma nodularity  PFT: [facility] spirometry 06/18/04 FVC 6.39 (118%) FEV1 4.68 (106%) Ratio 73   Interpretation: Normal spirometry  CPET 10/14/22 Normal functional capacity  Bronchoscopy:   Pathology:   Labs:    Latest Ref Rng & Units 02/25/2023   10:04 AM 07/22/2022    8:20 AM 07/23/2021    1:52 PM  CBC  WBC 4.0 - 10.5 K/uL 7.7  6.4  6.1   Hemoglobin 13.0 - 17.0 g/dL 16.1  09.6  04.5   Hematocrit 39.0 - 52.0 % 42.9  42.3  43.8   Platelets 150.0 - 400.0 K/uL 285.0  279.0  226.0       Latest Ref Rng & Units 02/25/2023   10:04 AM 07/22/2022    8:20 AM 07/23/2021    1:52 PM  CMP  Glucose 70 - 99 mg/dL 78  95  409   BUN 6 - 23 mg/dL 11  12  12    Creatinine 0.40 - 1.50 mg/dL 8.11  9.14  7.82   Sodium 135 - 145 mEq/L 138  137  136   Potassium 3.5 - 5.1 mEq/L 4.6  4.1  3.7   Chloride 96 - 112 mEq/L 100  99  99   CO2 19 - 32 mEq/L 31  30  29    Calcium 8.4 - 10.5 mg/dL 95.6  9.5  9.8   Total Protein 6.0 - 8.3 g/dL 7.0  7.4  7.3   Total Bilirubin 0.2 - 1.2 mg/dL 1.5  1.8  2.3   Alkaline Phos 39 - 117 U/L 65  67  59   AST 0 - 37 U/L 27  23  23    ALT 0 - 53 U/L 18  16  18     Normal labs reviewed as above with mild t bili which has improved     Assessment & Plan:   Discussion: 52 year old male never smoker with mild asthma, UC/IBS/pouchitis s/p colectomy in 2004, chronic polypoid sinusitis s/p sinus surgery 2005, spondyloarthropathy, hx of remicaide and humira who presents to establish care for his sarcoidosis and asthma.  Pulmonary history preceded by idiopathic inflammatory bowel disease (failed 6 mercaptopurine and azathioprine) treated primarily with high dose prednisone. Extensive work-up including evaluation with bronchoscopy with biopsies sent to [facility] suggestive of extraintestinal manifestations of IBS and follow-up with [facility] in 2005. Notes from October and November 2005 reviewed.  We discussed the clinical course of sarcoid and management  including  serial PFTs, labs, eye exam, and EKG and chest imaging if indicated. If symptoms suggest sarcoid flare in the future, we would manage with steroids +/- biologics.  Probable sarcoid involving lung and skin and ?GI Mediastinal lymphadenopathy  --STOP prednisone --ORDER PET/CT for mediastinal adenopathy --Consider bronchoscopy based on results  Health Maintenance Immunization History  Administered Date(s) Administered   Hepatitis A 08/24/2005, 02/04/2006   Hepatitis B 01/11/1992, 02/11/1992, 07/13/1992   Influenza Split 05/26/2012   Influenza Whole 05/13/2008, 04/30/2010, 04/30/2011   Influenza,inj,Quad PF,6+ Mos 06/08/2013, 06/02/2014, 05/24/2015, 05/07/2016, 04/30/2017, 05/29/2018, 06/15/2019, 07/21/2020, 06/19/2021, 07/22/2022   MMR 10/19/1971, 03/21/1978   PFIZER(Purple Top)SARS-COV-2 Vaccination 10/28/2019, 11/18/2019, 05/08/2020   PPD Test 04/30/2011, 05/26/2012, 06/08/2013, 05/30/2014, 05/24/2015, 05/07/2016, 04/30/2017   Pneumococcal Polysaccharide-23 04/30/2010   Td 06/19/2006   Tdap 04/30/2011, 07/21/2020   CT Lung Screen - never smoker, not qualified  Orders Placed This Encounter  Procedures   NM PET Image Initial (PI) Skull Base To Thigh    Standing Status:   Future    Standing Expiration Date:   04/23/2024    Scheduling Instructions:     Schedule in October with Pulmonary follow-up afterwards    Order Specific Question:   If indicated for the ordered procedure, I authorize the administration of a radiopharmaceutical per Radiology protocol    Answer:   Yes    Order Specific Question:   Preferred imaging location?    Answer:   [facility]  No orders of the defined types were placed in this encounter.   Return in about 6 weeks (around 06/03/2023).  I have spent a total time of 62-minutes on the day of the appointment reviewing prior documentation, coordinating care and discussing medical diagnosis and plan with the patient/family. Imaging, labs and tests  included in this note have been reviewed and interpreted independently by me.  Skyla Champagne Mechele Collin, MD [facility] 04/24/2023 1:10 PM  Office Number [phone]

## 2023-04-24 NOTE — Patient Instructions (Addendum)
Mediastinal lymphadenopathy  Possible sarcoidosis --STOP prednisone --ORDER PET/CT for mediastinal adenopathy

## 2023-04-25 ENCOUNTER — Other Ambulatory Visit: Payer: Self-pay | Admitting: Family Medicine

## 2023-04-25 MED ORDER — RIZATRIPTAN BENZOATE 5 MG PO TABS: 5.0000 mg | ORAL_TABLET | ORAL | 0 refills | Status: DC | PRN

## 2023-05-06 ENCOUNTER — Encounter (HOSPITAL_BASED_OUTPATIENT_CLINIC_OR_DEPARTMENT_OTHER): Payer: Self-pay | Admitting: Pulmonary Disease

## 2023-05-08 ENCOUNTER — Encounter: Payer: Self-pay | Admitting: Family Medicine

## 2023-05-08 ENCOUNTER — Other Ambulatory Visit: Payer: Self-pay | Admitting: Family Medicine

## 2023-05-08 NOTE — Telephone Encounter (Signed)
Please advise 

## 2023-05-08 NOTE — Telephone Encounter (Signed)
I appreciate the update.  He can hear that about his family.  I refilled his Valium for him today.  He should follow-up with [place] soon.  Katina Degree. [name], MD 05/08/2023 3:02 PM

## 2023-05-26 ENCOUNTER — Encounter (HOSPITAL_COMMUNITY)
Admission: RE | Admit: 2023-05-26 | Discharge: 2023-05-26 | Disposition: A | Discharge status: Home / Self Care | Payer: 59 | Source: Ambulatory Visit | Attending: Pulmonary Disease | Admitting: Pulmonary Disease

## 2023-05-26 DIAGNOSIS — R590 Localized enlarged lymph nodes: Secondary | ICD-10-CM | POA: Insufficient documentation

## 2023-05-26 LAB — GLUCOSE, CAPILLARY: Glucose-Capillary: 86 mg/dL (ref 70–99)

## 2023-05-26 MED ORDER — FLUDEOXYGLUCOSE F - 18 (FDG) INJECTION
7.5000 | Freq: Once | INTRAVENOUS | Status: AC
  Administered 2023-05-26: 7.5 via INTRAVENOUS

## 2023-06-02 ENCOUNTER — Ambulatory Visit (INDEPENDENT_AMBULATORY_CARE_PROVIDER_SITE_OTHER): Payer: 59 | Admitting: Family Medicine

## 2023-06-02 ENCOUNTER — Encounter: Payer: Self-pay | Admitting: Family Medicine

## 2023-06-02 VITALS — BP 118/76 | HR 85 | Temp 98.6°F | Ht 70.0 in | Wt 153.4 lb

## 2023-06-02 DIAGNOSIS — F411 Generalized anxiety disorder: Secondary | ICD-10-CM | POA: Diagnosis not present

## 2023-06-02 DIAGNOSIS — R053 Chronic cough: Secondary | ICD-10-CM

## 2023-06-02 DIAGNOSIS — Z23 Encounter for immunization: Secondary | ICD-10-CM

## 2023-06-02 DIAGNOSIS — Z111 Encounter for screening for respiratory tuberculosis: Secondary | ICD-10-CM

## 2023-06-02 NOTE — Patient Instructions (Signed)
It was very nice to see you today!  We will give you your flu vaccine and shingles vaccine today.   We will check your TB test today.   Return for Annual Physical.   Take care, Dr [name]  PLEASE NOTE:  If you had any lab tests, please let us know if you have not heard back within a few days. You may see your results on mychart before we have a chance to review them but we will give you a call once they are reviewed by [place].   If we ordered any referrals today, please let us know if you have not heard from their office within the next week.   If you had any urgent prescriptions sent in today, please check with the pharmacy within an hour of our visit to make sure the prescription was transmitted appropriately.   Please try these tips to maintain a healthy lifestyle:  Eat at least 3 REAL meals and 1-2 snacks per day.  Aim for no more than 5 hours between eating.  If you eat breakfast, please do so within one hour of getting up.   Each meal should contain half fruits/vegetables, one quarter protein, and one quarter carbs (no bigger than a computer mouse)  Cut down on sweet beverages. This includes juice, soda, and sweet tea.   Drink at least 1 glass of water with each meal and aim for at least 8 glasses per day  Exercise at least 150 minutes every week.

## 2023-06-02 NOTE — Assessment & Plan Note (Signed)
Recently established with pulmonology.  Has nuclear medicine PET CT scan which is pending.  Has follow-up with pulmonology tomorrow.  He is currently on Wixela.  Will defer further management to pulmonology.

## 2023-06-02 NOTE — Progress Notes (Signed)
   Charles [name] is a 52 y.o. male who presents today for an office visit.  Assessment/Plan:  Chronic Problems Addressed Today: Anxiety state He was under a lot of stress last month however this does seem to be improving.  He has Valium to use as needed though uses this very sporadically.  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  The son is now seeing a therapist.  Chronic cough Recently established with pulmonology.  Has nuclear medicine PET CT scan which is pending.  Has follow-up with pulmonology tomorrow.  He is currently on Wixela.  Will defer further management to pulmonology.  Preventative Healthcare - Flu and shingles vaccine given today.  Check [employer] for upcoming employment.    Subjective:  HPI:  See Assessment / plan for status of chronic conditions.  Patient is here today for follow up.  He recently accept a job position and needs vaccines and labs updated. Needs flu vaccine today. He did have a situation with his son at boarding school and had to end up leaving. This has been a large source of stress.  His son is now back at home.  He is seeing a Veterinary surgeon.       Objective:  Physical Exam: BP 118/76   Pulse 85   Temp 98.6 F (37 C) (Temporal)   Ht 5\' 10"  (1.778 m)   Wt 153 lb 6.4 oz (69.6 kg)   SpO2 98%   BMI 22.01 kg/m   Gen: No acute distress, resting comfortably CV: Regular rate and rhythm with no murmurs appreciated Pulm: Normal work of breathing, clear to auscultation bilaterally with no crackles, wheezes, or rhonchi Neuro: Grossly normal, moves all extremities Psych: Normal affect and thought content      Charles [name] M. [name], MD 06/02/2023 2:18 PM

## 2023-06-02 NOTE — Assessment & Plan Note (Signed)
He was under a lot of stress last month however this does seem to be improving.  He has Valium to use as needed though uses this very sporadically.  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  The son is now seeing a therapist.

## 2023-06-03 ENCOUNTER — Encounter (HOSPITAL_BASED_OUTPATIENT_CLINIC_OR_DEPARTMENT_OTHER): Payer: Self-pay | Admitting: Pulmonary Disease

## 2023-06-03 ENCOUNTER — Ambulatory Visit (INDEPENDENT_AMBULATORY_CARE_PROVIDER_SITE_OTHER): Payer: 59 | Admitting: Pulmonary Disease

## 2023-06-03 VITALS — BP 112/74 | HR 79 | Resp 16 | Ht 70.0 in | Wt 154.2 lb

## 2023-06-03 DIAGNOSIS — D869 Sarcoidosis, unspecified: Secondary | ICD-10-CM

## 2023-06-03 DIAGNOSIS — R053 Chronic cough: Secondary | ICD-10-CM | POA: Diagnosis not present

## 2023-06-03 MED ORDER — STIOLTO RESPIMAT 2.5-2.5 MCG/ACT IN AERS: 2.0000 | INHALATION_SPRAY | Freq: Every day | RESPIRATORY_TRACT | Status: DC

## 2023-06-03 MED ORDER — STIOLTO RESPIMAT 2.5-2.5 MCG/ACT IN AERS: 2.0000 | INHALATION_SPRAY | Freq: Every day | RESPIRATORY_TRACT | 5 refills | Status: DC

## 2023-06-03 NOTE — Addendum Note (Signed)
Addended by: Jama Flavors on: 06/03/2023 03:50 PM   Modules accepted: Orders

## 2023-06-03 NOTE — Progress Notes (Signed)
Subjective:   PATIENT ID: Charles [name] GENDER: male DOB: 1971-04-08, MRN: [id number]  Chief Complaint  Patient presents with   Follow-up    Breathing is fine.   Reason for Visit: Follow-up sarcoidosis  Mr. Charles [name] is a 52 year old male never smoker with mild asthma, UC/IBS/pouchitis s/p colectomy in 2004, chronic polypoid sinusitis s/p sinus surgery 2005, spondyloarthropathy, hx of remicaide and humira who presents for follow-up sarcoidosis.  Initial consult 04/25/23 Preceding his respiratory symptoms, he was diagnosed with idiopathic IBS after presented with abdominal cramping and diarrhea in 08/2002. He underwent colectomy in 02/2003 and requiring immunosuppressive therapy including prednisone. 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  In 2005 he developed severe coughing and seen by ENT due to unresponsiveness to conservative therapy. Did receive steroids which would suppress symptoms but would recur of medication so ended up being on prednisone 35-40 mg chronically. Underwent  bronchoscopy at [facility] in [place], [place] on 03/20/2004 for chronic cough with specimens from right mainstem and trachea described as "nodular inflammatory lesions". Per surgical path report final diagnosis: "Respiratory mucosa with squamous metaplasia and severe acute and chronic inflammation. [place] red special stain for amyloidosis: Negative." Path sent to [facility] considering that these findings could represent a manifestation of his underlying idiopathic inflammatory bowel disease. [facility] final read: "Lung, right mainstem, bronchus and tracheo, bronchoscopic biopsies: Severe acute and chronic inflammation with squamous metaplasia consistent with idiopathic inflammatory bowel disease-related airway disease"   Asthma was initially diagnosed in 1995 and  is usually triggered by pollen in the spring. Intermittently uses steroid inhaler (Advair 100, Qvar 40 tried and formoterol) during this time. Developed some recent  post-nasal drainage. Had developed a hacking cough with thick clear sputum. Has been using Wixela with some improvement but developed thrush. No shortness of breath. Has been on prednisone for 2-3 months. Started on prednisone 20 mg 1-2 weeks ago and tapered over this tim. Currently on 2.5 mg daily   He recently had a CT Cardiac scoring due to desire to work himself up since he turned 52. Was seen by Had CPET completed and normal. Fatigue has been years with thoughts this was related to his autoimmune.   He reports some sort of rash on left knee and periodically had skin rashes over the years.   Has been off Humira June 2023. Previously on remicaide 2009-2014. And before this Enbrel in ~2007 for 1 year. Has not tried methotrexate.  Enjoys running 2-3 days a week.   Family member with sarcoid, maternal side Aunt with tracheobronchial amyloidosis  06/03/23 Since our last he has been weaned off prednisone for 4-5 weeks. Occasional productive cough in the morning, worsening nasal congestion seems to affect. He presents for PET/CT scan review. At baseline no limitations in activity. Active runner.  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   Drug-induced acute pancreatitis without infection or necrosis 02/28/2021   Dysplastic nevi    GERD (gastroesophageal reflux disease)    Herniated disc    History of proctitis 2006   IBD (inflammatory bowel disease)    Incisional hernia    Insomnia    Migraine  headache    Morton's neuroma of right foot    Osteopenia    Pouchitis (HCC) 2006   Small bowel obstruction due to adhesions (HCC) 02/09/2015   Thyroid disease    Vitamin D  deficiency      Family History  Problem Relation Age of Onset   Ulcerative colitis Mother    Graves' disease Mother    Colon cancer Paternal Grandmother    Brain cancer Paternal Grandmother        mets from colon   Esophageal cancer Neg Hx    Rectal cancer Neg Hx    Stomach cancer Neg Hx      Social History   Occupational History   Occupation: Nature conservation officer: [employer]  Tobacco Use   Smoking status: Never   Smokeless tobacco: Never  Vaping Use   Vaping status: Never Used  Substance and Sexual Activity   Alcohol use: Yes    Alcohol/week: 14.0 standard drinks of alcohol    Types: 14 Glasses of wine per week    Comment: 3-4 times/week   Drug use: No   Sexual activity: Yes    Allergies  Allergen Reactions   Azathioprine Other (See Comments)    Serum sickness / acute hypersensitivity reaction with possible Acute pancreatitis   Mercaptopurine Nausea And Vomiting    Felt ill within 2 days of initiating therapy.     Outpatient Medications Prior to Visit  Medication Sig Dispense Refill   albuterol (VENTOLIN HFA) 108 (90 Base) MCG/ACT inhaler Inhale 1 puff into the lungs every 6 (six) hours as needed for wheezing or shortness of breath. 8 g 2   Ascorbic Acid (VITAMIN C PO) Take 1 tablet by mouth daily.     betamethasone dipropionate 0.05 % cream Apply topically.     Budesonide 90 MCG/ACT inhaler Inhale 1 puff into the lungs 2 (two) times daily. for wheezing or shortness of breath. 1 each 5   calcipotriene (DOVONOX) 0.005 % cream Apply topically.     celecoxib (CELEBREX) 100 MG capsule TAKE 1 CAPSULE BY MOUTH 2 TIMES DAILY AS NEEDED 60 capsule 5   cholecalciferol (VITAMIN D) 1000 units tablet Take 2,000 Units by mouth daily.     Cyanocobalamin (VITAMIN B-12 PO) Take 500 mcg by mouth daily.     diazepam (VALIUM) 5 MG tablet TAKE 1 TABLET(5 MG) BY MOUTH EVERY 12 HOURS AS NEEDED FOR ANXIETY 30 tablet 5   hydrocortisone 2.5 % cream Apply 1 Application  topically 4 (four) times daily.     montelukast (SINGULAIR) 10 MG tablet Take 1 tablet (10 mg total) by mouth at bedtime. 30 tablet 3   pantoprazole (PROTONIX) 40 MG tablet Take 1 tablet (40 mg total) by mouth daily. 30 tablet 3   rizatriptan (MAXALT) 5 MG tablet Take 1 tablet (5 mg total) by mouth as needed for migraine. May repeat in 2 hours if needed 30 tablet 0   rosuvastatin (CRESTOR) 5 MG tablet Take 1 tablet (5 mg total) by mouth daily. 90 tablet 3   traZODone (DESYREL) 50 MG tablet TAKE 1/2 TO 1 TABLET(25 TO 50 MG) BY MOUTH AT BEDTIME AS NEEDED FOR SLEEP 30 tablet 3   fluticasone-salmeterol (WIXELA INHUB) 100-50 MCG/ACT AEPB Inhale 1 puff into the lungs 2 (two) times daily. 60 each 12   No facility-administered medications prior to visit.    Review of Systems  Constitutional:  Negative for chills, diaphoresis, fever, malaise/fatigue and weight loss.  HENT:  Positive for congestion.   Respiratory:  Positive for cough. Negative for hemoptysis, sputum production, shortness of breath and wheezing.   Cardiovascular:  Negative for chest pain, palpitations and leg swelling.     Objective:   Vitals:   06/03/23 0838  BP: 112/74  Pulse: 79  Resp: 16  SpO2: 98%  Weight: 154 lb 3.2 oz (69.9 kg)  Height: 5\' 10"  (1.778 m)    SpO2: 98 %  Physical Exam: General: Well-appearing, no acute distress HENT: [place], AT Eyes: EOMI, no scleral icterus Respiratory: Clear to auscultation bilaterally.  No crackles, wheezing or rales Cardiovascular: RRR, -M/R/G, no JVD Extremities:-Edema,-tenderness Neuro: AAO x4, CNII-XII grossly intact Psych: Normal mood, normal affect  Data Reviewed:  Imaging: CT Chest 03/05/23 - mediastinal and hilar lymph nodes with perilymphatic pulmonary parenchyma nodularity PET/CT 05/26/23 - Nonspecific lowe level hypermetabolic activity in mediastinal and hilar lymph nodes. Spleen as well. Stable pulmonary sarcoid.  PFT: [facility] spirometry 06/18/04 FVC 6.39  (118%) FEV1 4.68 (106%) Ratio 73   Interpretation: Normal spirometry  CPET 10/14/22 Normal functional capacity  Bronchoscopy:   Pathology:   Labs:    Latest Ref Rng & Units 02/25/2023   10:04 AM 07/22/2022    8:20 AM 07/23/2021    1:52 PM  CBC  WBC 4.0 - 10.5 K/uL 7.7  6.4  6.1   Hemoglobin 13.0 - 17.0 g/dL 14.7  82.9  56.2   Hematocrit 39.0 - 52.0 % 42.9  42.3  43.8   Platelets 150.0 - 400.0 K/uL 285.0  279.0  226.0       Latest Ref Rng & Units 02/25/2023   10:04 AM 07/22/2022    8:20 AM 07/23/2021    1:52 PM  CMP  Glucose 70 - 99 mg/dL 78  95  130   BUN 6 - 23 mg/dL 11  12  12    Creatinine 0.40 - 1.50 mg/dL 8.65  7.84  6.96   Sodium 135 - 145 mEq/L 138  137  136   Potassium 3.5 - 5.1 mEq/L 4.6  4.1  3.7   Chloride 96 - 112 mEq/L 100  99  99   CO2 19 - 32 mEq/L 31  30  29    Calcium 8.4 - 10.5 mg/dL 29.5  9.5  9.8   Total Protein 6.0 - 8.3 g/dL 7.0  7.4  7.3   Total Bilirubin 0.2 - 1.2 mg/dL 1.5  1.8  2.3   Alkaline Phos 39 - 117 U/L 65  67  59   AST 0 - 37 U/L 27  23  23    ALT 0 - 53 U/L 18  16  18     Normal labs reviewed as above with mild t bili which has improved     Assessment & Plan:   Discussion: 52 year old male never smoker with mild asthma, UC/IBS/pouchitis s/p colectomy in 2004, chronic polypoid sinusitis s/p sinus surgery 2005, spondyloarthropathy, hx of remicaide and humira who presents for follow-up sarcoidosis and asthma.  Pulmonary history preceded by idiopathic inflammatory bowel disease (failed 6 mercaptopurine and azathioprine) treated primarily with high dose prednisone. Extensive work-up including evaluation with bronchoscopy with biopsies sent to [facility] suggestive of extraintestinal manifestations of IBS and follow-up with [facility] in 2005. Notes from October and November 2005 reviewed.  Currently sarcoid with low level presence on PET/CT. Mild cough but overall asymptomatic. Hold on diagnostic testing and immunosuppressive agents at this time.  Contacted patient via mychart for his PET/CT results. Will also  trial LAMA/LABA.   Probable sarcoid involving lung and skin and ?GI Mediastinal lymphadenopathy  --Off prednisone with congested/productive cough --Reviewed PET/CT. Mild inflammation in mediastinal nodes. Will wait for final radiology read --Hold on bronchoscopy  Chronic cough --Hold on ICS/LABA on thrush for now. May reconsider if other inhalers ineffective --Trial Stiolto TWO puffs in the morning daily during fall/spring season   Health Maintenance Immunization History  Administered Date(s) Administered   Hepatitis A 08/24/2005, 02/04/2006   Hepatitis B 01/11/1992, 02/11/1992, 07/13/1992   Influenza Split 05/26/2012   Influenza Whole 05/13/2008, 04/30/2010, 04/30/2011   Influenza, Seasonal, Injecte, Preservative Fre 06/02/2023   Influenza,inj,Quad PF,6+ Mos 06/08/2013, 06/02/2014, 05/24/2015, 05/07/2016, 04/30/2017, 05/29/2018, 06/15/2019, 07/21/2020, 06/19/2021, 07/22/2022   MMR 10/19/1973, 03/21/1978   PFIZER(Purple Top)SARS-COV-2 Vaccination 10/28/2019, 11/18/2019, 05/08/2020   PPD Test 04/30/2011, 05/26/2012, 06/08/2013, 05/30/2014, 05/24/2015, 05/07/2016, 04/30/2017   Pneumococcal Polysaccharide-23 04/30/2010   Td 06/19/2006   Tdap 04/30/2011, 07/21/2020   Zoster Recombinant(Shingrix) 06/02/2023   CT Lung Screen - never smoker, not qualified  No orders of the defined types were placed in this encounter.  Meds ordered this encounter  Medications   Tiotropium Bromide-Olodaterol (STIOLTO RESPIMAT) 2.5-2.5 MCG/ACT AERS    Sig: Inhale 2 puffs into the lungs daily.    Dispense:  4 g    Refill:  5    Return in about 3 months (around 09/03/2023).  I have spent a total time of 40-minutes on the day of the appointment including chart review, data review, collecting history, coordinating care and discussing medical diagnosis and plan with the patient/family. Past medical history, allergies, medications were  reviewed. Pertinent imaging, labs and tests included in this note have been reviewed and interpreted independently by me.  Nur Rabold Mechele Collin, MD [facility] 06/03/2023 10:08 AM  Office Number [phone]

## 2023-06-03 NOTE — Patient Instructions (Signed)
Probable sarcoid involving lung and skin and ?GI Mediastinal lymphadenopathy  --Off prednisone with congested/productive cough --Reviewed PET/CT. Mild inflammation in mediastinal nodes. Will wait for final radiology read --Hold on bronchoscopy  Chronic cough --Hold on ICS/LABA on thrush for now. May reconsider if other inhalers ineffective --Trial Stiolto TWO puffs in the morning daily during fall/spring season

## 2023-06-04 ENCOUNTER — Encounter: Payer: Self-pay | Admitting: Family Medicine

## 2023-06-04 LAB — QUANTIFERON-TB GOLD PLUS
Mitogen-NIL: 7.25 [IU]/mL
NIL: 0.03 [IU]/mL
QuantiFERON-TB Gold Plus: NEGATIVE
TB1-NIL: 0 [IU]/mL
TB2-NIL: 0 [IU]/mL

## 2023-06-04 NOTE — Progress Notes (Signed)
TB test is negative.  Charles [name]. [name], MD 06/04/2023 7:27 AM

## 2023-06-24 ENCOUNTER — Encounter: Payer: Self-pay | Admitting: Family Medicine

## 2023-07-02 NOTE — Telephone Encounter (Signed)
Pt would like a copy of his shot records and he will come by and pick it up.

## 2023-07-02 NOTE — Telephone Encounter (Signed)
Patient aware immunization records printed  Will pick up today

## 2023-07-02 NOTE — Telephone Encounter (Signed)
Pt called again and needs to speak to [facility]. Please advise.

## 2023-07-22 ENCOUNTER — Encounter: Payer: Self-pay | Admitting: Family Medicine

## 2023-07-22 ENCOUNTER — Ambulatory Visit (INDEPENDENT_AMBULATORY_CARE_PROVIDER_SITE_OTHER): Payer: 59 | Admitting: Family Medicine

## 2023-07-22 VITALS — BP 115/77 | HR 93 | Temp 97.5°F | Ht 70.0 in | Wt 159.8 lb

## 2023-07-22 DIAGNOSIS — E785 Hyperlipidemia, unspecified: Secondary | ICD-10-CM

## 2023-07-22 DIAGNOSIS — Z0001 Encounter for general adult medical examination with abnormal findings: Secondary | ICD-10-CM | POA: Diagnosis not present

## 2023-07-22 DIAGNOSIS — E538 Deficiency of other specified B group vitamins: Secondary | ICD-10-CM | POA: Diagnosis not present

## 2023-07-22 DIAGNOSIS — K50118 Crohn's disease of large intestine with other complication: Secondary | ICD-10-CM | POA: Diagnosis not present

## 2023-07-22 DIAGNOSIS — E559 Vitamin D deficiency, unspecified: Secondary | ICD-10-CM

## 2023-07-22 DIAGNOSIS — D863 Sarcoidosis of skin: Secondary | ICD-10-CM

## 2023-07-22 DIAGNOSIS — F411 Generalized anxiety disorder: Secondary | ICD-10-CM

## 2023-07-22 DIAGNOSIS — Z131 Encounter for screening for diabetes mellitus: Secondary | ICD-10-CM

## 2023-07-22 DIAGNOSIS — R053 Chronic cough: Secondary | ICD-10-CM

## 2023-07-22 LAB — COMPREHENSIVE METABOLIC PANEL
ALT: 15 U/L (ref 0–53)
AST: 22 U/L (ref 0–37)
Albumin: 4 g/dL (ref 3.5–5.2)
Alkaline Phosphatase: 60 U/L (ref 39–117)
BUN: 11 mg/dL (ref 6–23)
CO2: 27 meq/L (ref 19–32)
Calcium: 9.4 mg/dL (ref 8.4–10.5)
Chloride: 102 meq/L (ref 96–112)
Creatinine, Ser: 0.88 mg/dL (ref 0.40–1.50)
GFR: 100.09 mL/min (ref >60.00)
Glucose, Bld: 86 mg/dL (ref 70–99)
Potassium: 3.9 meq/L (ref 3.5–5.1)
Sodium: 136 meq/L (ref 135–145)
Total Bilirubin: 1.7 mg/dL — ABNORMAL HIGH (ref 0.2–1.2)
Total Protein: 7.3 g/dL (ref 6.0–8.3)

## 2023-07-22 LAB — CBC
HCT: 42.9 % (ref 39.0–52.0)
Hemoglobin: 14.3 g/dL (ref 13.0–17.0)
MCHC: 33.3 g/dL (ref 30.0–36.0)
MCV: 92.3 fL (ref 78.0–100.0)
Platelets: 252 10*3/uL (ref 150.0–400.0)
RBC: 4.65 Mil/uL (ref 4.22–5.81)
RDW: 13.1 % (ref 11.5–15.5)
WBC: 6 10*3/uL (ref 4.0–10.5)

## 2023-07-22 LAB — LIPID PANEL
Cholesterol: 163 mg/dL (ref 0–200)
HDL: 52.4 mg/dL (ref >39.00)
LDL Cholesterol: 92 mg/dL (ref 0–99)
NonHDL: 110.15
Total CHOL/HDL Ratio: 3
Triglycerides: 93 mg/dL (ref 0.0–149.0)
VLDL: 18.6 mg/dL (ref 0.0–40.0)

## 2023-07-22 LAB — VITAMIN B12: Vitamin B-12: 629 pg/mL (ref 211–911)

## 2023-07-22 LAB — TSH: TSH: 2.27 u[IU]/mL (ref 0.35–5.50)

## 2023-07-22 LAB — VITAMIN D 25 HYDROXY (VIT D DEFICIENCY, FRACTURES): VITD: 32.15 ng/mL (ref 30.00–100.00)

## 2023-07-22 LAB — HEMOGLOBIN A1C: Hgb A1c MFr Bld: 5.4 % (ref 4.6–6.5)

## 2023-07-22 NOTE — Progress Notes (Signed)
Chief Complaint:  Charles [name] is a 52 y.o. male who presents today for his annual comprehensive physical exam.    Assessment/Plan:  Chronic Problems Addressed Today: Vitamin D deficiency Check vitamin D.   Crohn's disease (HCC) Follows with GI.  He is doing well.  No recent flares.  Anxiety state Overall symptoms are stable.  He does have Valium to use as needed however uses this very sporadically.  Does not need any refills today.  Dyslipidemia Check lipids.  Continue Crestor 5 mg daily per cardiology.  Low serum vitamin B12 Check vitamin B12.   Cutaneous sarcoidosis Continue management per dermatology.  Chronic cough He has been following with pulmonology for this.  Overall his symptoms are stable.  Recent PET scan did show mild sarcoidosis however no active lesions.  They are continue with active surveillance and he will follow-up with pulmonology again in a few months.  Preventative Healthcare: Check labs.  He will come back soon for second shingles vaccine.  Patient Counseling(The following topics were reviewed and/or handout was given):  -Nutrition: Stressed importance of moderation in sodium/caffeine intake, saturated fat and cholesterol, caloric balance, sufficient intake of fresh fruits, vegetables, and fiber.  -Stressed the importance of regular exercise.   -Substance Abuse: Discussed cessation/primary prevention of tobacco, alcohol, or other drug use; driving or other dangerous activities under the influence; availability of treatment for abuse.   -Injury prevention: Discussed safety belts, safety helmets, smoke detector, smoking near bedding or upholstery.   -Sexuality: Discussed sexually transmitted diseases, partner selection, use of condoms, avoidance of unintended pregnancy and contraceptive alternatives.   -Dental health: Discussed importance of regular tooth brushing, flossing, and dental visits.  -Health maintenance and immunizations reviewed. Please refer  to Health maintenance section.  Return to care in 1 year for next preventative visit.     Subjective:  HPI:  He has no acute complaints today. See Assessment / plan for status of chronic conditions.   Lifestyle Diet: Balanced. Plenty of fruits and vegetables.  Exercise: Running daily. Doing pushups daily.      07/22/2023    8:29 AM  Depression screen PHQ 2/9  Decreased Interest 0  Down, Depressed, Hopeless 0  PHQ - 2 Score 0  Altered sleeping 0  Tired, decreased energy 0  Change in appetite 0  Feeling bad or failure about yourself  0  Trouble concentrating 0  Moving slowly or fidgety/restless 0  Suicidal thoughts 0  PHQ-9 Score 0  Difficult doing work/chores Not difficult at all    There are no preventive care reminders to display for this patient.   ROS: Per HPI, otherwise a complete review of systems was negative.   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   Drug-induced acute pancreatitis without infection or necrosis 02/28/2021   Dysplastic nevi    GERD (gastroesophageal reflux disease)    Herniated disc    History of proctitis 2006   IBD (inflammatory bowel disease)    Incisional hernia    Insomnia    Migraine headache    Morton's neuroma of right foot    Osteopenia    Pouchitis (HCC) 2006  Small bowel obstruction due to adhesions (HCC) 02/09/2015   Thyroid disease    Vitamin D deficiency    Patient Active Problem List   Diagnosis Date Noted   Chronic cough 02/25/2023   Low serum vitamin B12 07/15/2022   Abnormal CT scan, lung 05/02/2022   Elevated coronary artery calcium score 05/02/2022   Insomnia 10/18/2021   Dyslipidemia  07/25/2021   Immunosuppression (HCC) 07/23/2021   Adverse effect of azathioprine 02/28/2021   Serum sickness due to drug 02/27/2021   Bilateral hearing loss 06/20/2020   Chronic pansinusitis 06/20/2020   Tinnitus, bilateral 06/20/2020   Degenerative disc disease, lumbar 06/13/2020   Synovitis of right knee 04/26/2020   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   Allergic rhinitis 11/10/2018   GERD (gastroesophageal reflux disease) 09/25/2018   Anxiety state 07/05/2016   Ileal pouchitis (HCC) 06/27/2015   Chest pain of uncertain etiology 12/06/2014   Vitamin D deficiency 08/31/2014   Crohn's disease (HCC) 12/27/2013   Airway hyperreactivity 06/11/2013   Long-term use of immunosuppressant medication-Humira 05/31/2011   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  with pouch ulcer.    FUNCTIONAL ENDOSCOPIC SINUS SURGERY  12/2003   partial ethmoidectomy.  Dr Annalee Genta   ileoanal pull-through     LUMBAR LAMINECTOMY/DECOMPRESSION MICRODISCECTOMY Right 10/13/2015   Procedure: Right Lumbar four- five Microdiskectomy;  Surgeon: Maeola Harman, MD;  Location: [facility];  Service: Neurosurgery;  Laterality: Right;  Right L4-5 Microdiskectomy   sacroilitis     SIGMOIDOSCOPY  01/2005   VENTRAL HERNIA REPAIR  06/2014    Family History  Problem Relation Age of Onset   Ulcerative colitis Mother    Luiz Blare' disease Mother    Colon cancer Paternal Grandmother    Brain cancer Paternal Grandmother        mets from colon   Esophageal cancer Neg Hx    Rectal cancer Neg Hx    Stomach cancer Neg Hx     Medications- reviewed and updated Current Outpatient Medications  Medication Sig Dispense Refill   albuterol (VENTOLIN  HFA) 108 (90 Base) MCG/ACT inhaler Inhale 1 puff into the lungs every 6 (six) hours as needed for wheezing or shortness of breath. 8 g 2   Ascorbic Acid (VITAMIN C PO) Take 1 tablet by mouth daily.     betamethasone dipropionate 0.05 % cream Apply topically.     Budesonide 90 MCG/ACT inhaler Inhale 1 puff into the lungs 2 (two) times daily. for wheezing or shortness of breath. 1 each 5   calcipotriene (DOVONOX) 0.005 % cream Apply topically.     celecoxib (CELEBREX) 100 MG capsule TAKE 1 CAPSULE BY MOUTH 2 TIMES DAILY AS NEEDED 60 capsule 5   cholecalciferol (VITAMIN D) 1000 units tablet Take 2,000 Units by mouth daily.     Cyanocobalamin (VITAMIN B-12 PO) Take 500 mcg by mouth daily.     diazepam (VALIUM) 5 MG tablet TAKE 1 TABLET(5 MG) BY MOUTH EVERY 12 HOURS AS NEEDED FOR ANXIETY 30 tablet 5   hydrocortisone 2.5 % cream Apply 1 Application topically 4 (four) times daily.     montelukast (SINGULAIR) 10 MG tablet Take 1 tablet (10 mg total) by mouth at bedtime. 30 tablet 3  pantoprazole (PROTONIX) 40 MG tablet Take 1 tablet (40 mg total) by mouth daily. 30 tablet 3   rizatriptan (MAXALT) 5 MG tablet Take 1 tablet (5 mg total) by mouth as needed for migraine. May repeat in 2 hours if needed 30 tablet 0   rosuvastatin (CRESTOR) 5 MG tablet Take 1 tablet (5 mg total) by mouth daily. 90 tablet 3   Tiotropium Bromide-Olodaterol (STIOLTO RESPIMAT) 2.5-2.5 MCG/ACT AERS Inhale 2 puffs into the lungs daily. 4 g 5   Tiotropium Bromide-Olodaterol (STIOLTO RESPIMAT) 2.5-2.5 MCG/ACT AERS Inhale 2 puffs into the lungs daily.     traZODone (DESYREL) 50 MG tablet TAKE 1/2 TO 1 TABLET(25 TO 50 MG) BY MOUTH AT BEDTIME AS NEEDED FOR SLEEP 30 tablet 3   No current facility-administered medications for this visit.    Allergies-reviewed and updated Allergies  Allergen Reactions   Azathioprine Other (See Comments)    Serum sickness / acute hypersensitivity reaction with possible Acute pancreatitis    Mercaptopurine Nausea And Vomiting    Felt ill within 2 days of initiating therapy.    Social History   Socioeconomic History   Marital status: Divorced    Spouse name: Not on file   Number of children: 2   Years of education: 16 at least   Highest education level: Master's degree (e.g., MA, MS, MEng, MEd, MSW, MBA)  Occupational History   Occupation: Nature conservation officer: [employer]  Tobacco Use   Smoking status: Never   Smokeless tobacco: Never  Vaping Use   Vaping status: Never Used  Substance and Sexual Activity   Alcohol use: Yes    Alcohol/week: 14.0 standard drinks of alcohol    Types: 14 Glasses of wine per week    Comment: 3-4 times/week   Drug use: No   Sexual activity: Yes  Other Topics Concern   Not on file  Social History Narrative   Father a pulmonologist in [place].   Married to Dr. 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[employer] stopped summer 2023   + EtOH   No tobacco   No drugs   Social Determinants of Health   Financial Resource Strain: Medium Risk (02/24/2023)   Overall Financial Resource Strain (CARDIA)    Difficulty of Paying Living Expenses: Somewhat hard  Food Insecurity: No Food Insecurity (02/24/2023)   Hunger Vital Sign    Worried About Running Out of Food in the Last Year: Never true    Ran Out of Food in the Last Year: Never true  Transportation Needs: No Transportation Needs (02/24/2023)   PRAPARE - Administrator, Civil Service (Medical): No    Lack of Transportation (Non-Medical): No  Physical Activity: Sufficiently Active (02/24/2023)   Exercise Vital Sign    Days of Exercise per Week: 6 days    Minutes of Exercise per Session: 40 min  Stress: No Stress Concern Present (02/24/2023)   [employer] of Occupational Health - Occupational Stress Questionnaire    Feeling of Stress : Only a little  Social Connections: Moderately  Integrated (02/24/2023)   Social Connection and Isolation Panel [NHANES]    Frequency of Communication with Friends and Family: More than three times a week    Frequency of Social Gatherings with Friends and Family: Not on file    Attends Religious Services: More than 4 times per year    Active  Member of Clubs or Organizations: Yes    Attends Engineer, structural: More than 4 times per year    Marital Status: Divorced        Objective:  Physical Exam: BP 115/77   Pulse 93   Temp (!) 97.5 F (36.4 C) (Temporal)   Ht 5\' 10"  (1.778 m)   Wt 159 lb 12.8 oz (72.5 kg)   SpO2 99%   BMI 22.93 kg/m   Body mass index is 22.93 kg/m. Wt Readings from Last 3 Encounters:  07/22/23 159 lb 12.8 oz (72.5 kg)  06/03/23 154 lb 3.2 oz (69.9 kg)  06/02/23 153 lb 6.4 oz (69.6 kg)   Gen: NAD, resting comfortably HEENT: TMs normal bilaterally. OP clear. No thyromegaly noted.  CV: RRR with no murmurs appreciated Pulm: NWOB, CTAB with no crackles, wheezes, or rhonchi GI: Normal bowel sounds present. Soft, Nontender, Nondistended. MSK: no edema, cyanosis, or clubbing noted Skin: warm, dry Neuro: CN2-12 grossly intact. Strength 5/5 in upper and lower extremities. Reflexes symmetric and intact bilaterally.  Psych: Normal affect and thought content     Erique Kaser M. [name], MD 07/22/2023 9:02 AM

## 2023-07-22 NOTE — Assessment & Plan Note (Signed)
Overall symptoms are stable.  He does have Valium to use as needed however uses this very sporadically.  Does not need any refills today.

## 2023-07-22 NOTE — Assessment & Plan Note (Signed)
Continue management per dermatology.  

## 2023-07-22 NOTE — Assessment & Plan Note (Signed)
Check lipids.  Continue Crestor 5 mg daily per cardiology.

## 2023-07-22 NOTE — Assessment & Plan Note (Signed)
He has been following with pulmonology for this.  Overall his symptoms are stable.  Recent PET scan did show mild sarcoidosis however no active lesions.  They are continue with active surveillance and he will follow-up with pulmonology again in a few months.

## 2023-07-22 NOTE — Assessment & Plan Note (Signed)
Follows with GI.  He is doing well.  No recent flares.

## 2023-07-22 NOTE — Assessment & Plan Note (Signed)
Check vitamin D. 

## 2023-07-22 NOTE — Assessment & Plan Note (Signed)
Check vitamin B12

## 2023-07-22 NOTE — Patient Instructions (Signed)
It was very nice to see you today!  We willCheck blood work  Please keep up the great work with your diet and exercise.  Return in about 1 year (around 07/21/2024) for Annual Physical.   Take care, Dr [name]  PLEASE NOTE:  If you had any lab tests, please let us know if you have not heard back within a few days. You may see your results on mychart before we have a chance to review them but we will give you a call once they are reviewed by [place].   If we ordered any referrals today, please let us know if you have not heard from their office within the next week.   If you had any urgent prescriptions sent in today, please check with the pharmacy within an hour of our visit to make sure the prescription was transmitted appropriately.   Please try these tips to maintain a healthy lifestyle:  Eat at least 3 REAL meals and 1-2 snacks per day.  Aim for no more than 5 hours between eating.  If you eat breakfast, please do so within one hour of getting up.   Each meal should contain half fruits/vegetables, one quarter protein, and one quarter carbs (no bigger than a computer mouse)  Cut down on sweet beverages. This includes juice, soda, and sweet tea.   Drink at least 1 glass of water with each meal and aim for at least 8 glasses per day  Exercise at least 150 minutes every week.    Preventive Care 91-74 Years Old, Male Preventive care refers to lifestyle choices and visits with your health care provider that can promote health and wellness. Preventive care visits are also called wellness exams. What can I expect for my preventive care visit? Counseling During your preventive care visit, your health care provider may ask about your: Medical history, including: Past medical problems. Family medical history. Current health, including: Emotional well-being. Home life and relationship well-being. Sexual activity. Lifestyle, including: Alcohol, nicotine or tobacco, and drug use. Access to  firearms. Diet, exercise, and sleep habits. Safety issues such as seatbelt and bike helmet use. Sunscreen use. Work and work Astronomer. Physical exam Your health care provider will check your: Height and weight. These may be used to calculate your BMI (body mass index). BMI is a measurement that tells if you are at a healthy weight. Waist circumference. This measures the distance around your waistline. This measurement also tells if you are at a healthy weight and may help predict your risk of certain diseases, such as type 2 diabetes and high blood pressure. Heart rate and blood pressure. Body temperature. Skin for abnormal spots. What immunizations do I need?  Vaccines are usually given at various ages, according to a schedule. Your health care provider will recommend vaccines for you based on your age, medical history, and lifestyle or other factors, such as travel or where you work. What tests do I need? Screening Your health care provider may recommend screening tests for certain conditions. This may include: Lipid and cholesterol levels. Diabetes screening. This is done by checking your blood sugar (glucose) after you have not eaten for a while (fasting). Hepatitis B test. Hepatitis C test. HIV (human immunodeficiency virus) test. STI (sexually transmitted infection) testing, if you are at risk. Lung cancer screening. Prostate cancer screening. Colorectal cancer screening. Talk with your health care provider about your test results, treatment options, and if necessary, the need for more tests. Follow these instructions at home: Eating  and drinking  Eat a diet that includes fresh fruits and vegetables, whole grains, lean protein, and low-fat dairy products. Take vitamin and mineral supplements as recommended by your health care provider. Do not drink alcohol if your health care provider tells you not to drink. If you drink alcohol: Limit how much you have to 0-2 drinks a  day. Know how much alcohol is in your drink. In the [place] one drink equals one 12 oz bottle of beer (355 mL), one 5 oz glass of wine (148 mL), or one 1 oz glass of hard liquor (44 mL). Lifestyle Brush your teeth every morning and night with fluoride toothpaste. Floss one time each day. Exercise for at least 30 minutes 5 or more days each week. Do not use any products that contain nicotine or tobacco. These products include cigarettes, chewing tobacco, and vaping devices, such as e-cigarettes. If you need help quitting, ask your health care provider. Do not use drugs. If you are sexually active, practice safe sex. Use a condom or other form of protection to prevent STIs. Take aspirin only as told by your health care provider. Make sure that you understand how much to take and what form to take. Work with your health care provider to find out whether it is safe and beneficial for you to take aspirin daily. Find healthy ways to manage stress, such as: Meditation, yoga, or listening to music. Journaling. Talking to a trusted person. Spending time with friends and family. Minimize exposure to UV radiation to reduce your risk of skin cancer. Safety Always wear your seat belt while driving or riding in a vehicle. Do not drive: If you have been drinking alcohol. Do not ride with someone who has been drinking. When you are tired or distracted. While texting. If you have been using any mind-altering substances or drugs. Wear a helmet and other protective equipment during sports activities. If you have firearms in your house, make sure you follow all gun safety procedures. What's next? Go to your health care provider once a year for an annual wellness visit. Ask your health care provider how often you should have your eyes and teeth checked. Stay up to date on all vaccines. This information is not intended to replace advice given to you by your health care provider. Make sure you discuss any  questions you have with your health care provider. Document Revised: 01/31/2021 Document Reviewed: 01/31/2021 Elsevier Patient Education  2024 [employer].

## 2023-07-23 LAB — VARICELLA ZOSTER ANTIBODY, IGG: Varicella IgG: 39.3 {s_co_ratio}

## 2023-07-24 NOTE — Progress Notes (Signed)
Labs are all stable.  His varicella test shows that he has immunity to chickenpox and he does not need any other vaccines for this.  His cholesterol and blood sugar are at goal.  Vitamin D and B12 are at goal as well.  The rest of his labs are all normal.  Do not need to make any changes to treatment plan.  He should continue to work on diet and exercise and we can recheck everything in a year or so.

## 2023-08-05 ENCOUNTER — Ambulatory Visit: Payer: 59

## 2023-08-06 ENCOUNTER — Ambulatory Visit: Payer: 59

## 2023-08-07 ENCOUNTER — Encounter: Payer: Self-pay | Admitting: Family Medicine

## 2023-08-07 ENCOUNTER — Ambulatory Visit (INDEPENDENT_AMBULATORY_CARE_PROVIDER_SITE_OTHER): Payer: 59

## 2023-08-07 DIAGNOSIS — Z23 Encounter for immunization: Secondary | ICD-10-CM | POA: Diagnosis not present

## 2023-08-07 NOTE — Progress Notes (Signed)
Per orders of Dr. [name], injection of 2nd Shingrix vaccine given in left deltoid per patient preference by Jobe Gibbon, CMA. Patient tolerated injection well.

## 2023-09-01 ENCOUNTER — Encounter: Payer: Self-pay | Admitting: Family Medicine

## 2023-09-01 MED ORDER — CELECOXIB 100 MG PO CAPS: ORAL_CAPSULE | ORAL | 5 refills | Status: AC

## 2023-09-01 NOTE — Telephone Encounter (Signed)
 Last refilled by Iva Boop, MD  LAST APPOINTMENT DATE: 07/22/2023 LAST REFILL:06/26/2023 QTY:60 Ref 5

## 2023-09-04 ENCOUNTER — Ambulatory Visit (HOSPITAL_BASED_OUTPATIENT_CLINIC_OR_DEPARTMENT_OTHER): Payer: 59 | Admitting: Pulmonary Disease

## 2023-09-18 ENCOUNTER — Encounter: Payer: Self-pay | Admitting: Family Medicine

## 2023-09-24 NOTE — Telephone Encounter (Signed)
 See note

## 2023-09-25 ENCOUNTER — Other Ambulatory Visit: Payer: Self-pay | Admitting: *Deleted

## 2023-09-25 DIAGNOSIS — J309 Allergic rhinitis, unspecified: Secondary | ICD-10-CM

## 2023-09-25 DIAGNOSIS — H9190 Unspecified hearing loss, unspecified ear: Secondary | ICD-10-CM

## 2023-09-25 NOTE — Telephone Encounter (Signed)
 ENT referral placed.

## 2023-09-25 NOTE — Progress Notes (Signed)
 ent

## 2023-09-25 NOTE — Telephone Encounter (Signed)
 Ok to place referral.  Jinny Mounts. Daneil Dunker, MD 09/25/2023 10:11 AM

## 2023-10-02 ENCOUNTER — Telehealth: Payer: Self-pay | Admitting: Internal Medicine

## 2023-10-02 MED ORDER — MESALAMINE 1000 MG RE SUPP: 1000.0000 mg | Freq: Every day | RECTAL | 1 refills | Status: AC

## 2023-10-02 NOTE — Telephone Encounter (Signed)
Scheduled OV with patient for 10/28/23 at 2:50 pm with Dr. Leone Payor. He's aware suppositories have been sent to pharmacy & will call back with any issues.

## 2023-10-02 NOTE — Telephone Encounter (Signed)
Patient with a history of Crohn's status post ileoanal pouch and pouchitis.  Had been well off Humira therapy for some time.  Recently has been working very hard was very fatigued the other week.  Had some diarrhea uptick, now not but has tenesmus without bleeding.  No abdominal pain or distention suggestive of obstruction.  We will try Canasa suppositories prescription sent to [facility].   Nursing staff please arrange follow-up in March.  Could use a nurse visit or a banding spot.  Thank you.

## 2023-10-08 ENCOUNTER — Telehealth: Payer: Self-pay | Admitting: Internal Medicine

## 2023-10-08 ENCOUNTER — Encounter: Payer: Self-pay | Admitting: Family Medicine

## 2023-10-08 DIAGNOSIS — K91850 Pouchitis: Secondary | ICD-10-CM

## 2023-10-08 DIAGNOSIS — K50118 Crohn's disease of large intestine with other complication: Secondary | ICD-10-CM

## 2023-10-08 MED ORDER — METRONIDAZOLE 500 MG PO TABS: 500.0000 mg | ORAL_TABLET | Freq: Two times a day (BID) | ORAL | 0 refills | Status: DC

## 2023-10-08 MED ORDER — PREDNISONE 10 MG PO TABS
40.0000 mg | ORAL_TABLET | Freq: Every day | ORAL | 0 refills | Status: DC
Start: 2023-10-08 — End: 2024-01-02

## 2023-10-08 MED ORDER — CIPROFLOXACIN HCL 500 MG PO TABS: 500.0000 mg | ORAL_TABLET | Freq: Two times a day (BID) | ORAL | 0 refills | Status: DC

## 2023-10-08 NOTE — Telephone Encounter (Signed)
Symptoms of pouchitis persist despite Canasa suppositories.  Mucous dc, loose stools, nocturnal also.   Will do labs + Tx w/ cipro, metronidazole and prednisone.  Labs  Orders Placed This Encounter  Procedures   CBC   Comp Met (CMET)   Sed Rate (ESR)   C-reactive protein   Meds ordered this encounter  Medications   predniSONE (DELTASONE) 10 MG tablet    Sig: Take 4 tablets (40 mg total) by mouth daily with breakfast.    Dispense:  120 tablet    Refill:  0   metroNIDAZOLE (FLAGYL) 500 MG tablet    Sig: Take 1 tablet (500 mg total) by mouth 2 (two) times daily.    Dispense:  20 tablet    Refill:  0   ciprofloxacin (CIPRO) 500 MG tablet    Sig: Take 1 tablet (500 mg total) by mouth 2 (two) times daily.    Dispense:  20 tablet    Refill:  0

## 2023-10-09 ENCOUNTER — Other Ambulatory Visit (INDEPENDENT_AMBULATORY_CARE_PROVIDER_SITE_OTHER): Payer: 59

## 2023-10-09 DIAGNOSIS — K50118 Crohn's disease of large intestine with other complication: Secondary | ICD-10-CM | POA: Diagnosis not present

## 2023-10-09 DIAGNOSIS — K91850 Pouchitis: Secondary | ICD-10-CM | POA: Diagnosis not present

## 2023-10-09 LAB — CBC
HCT: 43.1 % (ref 39.0–52.0)
Hemoglobin: 14.8 g/dL (ref 13.0–17.0)
MCHC: 34.4 g/dL (ref 30.0–36.0)
MCV: 92.2 fL (ref 78.0–100.0)
Platelets: 340 10*3/uL (ref 150.0–400.0)
RBC: 4.67 Mil/uL (ref 4.22–5.81)
RDW: 13.7 % (ref 11.5–15.5)
WBC: 11.1 10*3/uL — ABNORMAL HIGH (ref 4.0–10.5)

## 2023-10-09 LAB — COMPREHENSIVE METABOLIC PANEL
ALT: 8 U/L (ref 0–53)
AST: 12 U/L (ref 0–37)
Albumin: 4.1 g/dL (ref 3.5–5.2)
Alkaline Phosphatase: 70 U/L (ref 39–117)
BUN: 14 mg/dL (ref 6–23)
CO2: 28 meq/L (ref 19–32)
Calcium: 9.6 mg/dL (ref 8.4–10.5)
Chloride: 98 meq/L (ref 96–112)
Creatinine, Ser: 1.07 mg/dL (ref 0.40–1.50)
GFR: 80.65 mL/min (ref >60.00)
Glucose, Bld: 126 mg/dL — ABNORMAL HIGH (ref 70–99)
Potassium: 4 meq/L (ref 3.5–5.1)
Sodium: 136 meq/L (ref 135–145)
Total Bilirubin: 1.2 mg/dL (ref 0.2–1.2)
Total Protein: 7.8 g/dL (ref 6.0–8.3)

## 2023-10-09 LAB — C-REACTIVE PROTEIN: CRP: 4 mg/dL (ref 0.5–20.0)

## 2023-10-09 LAB — SEDIMENTATION RATE: Sed Rate: 67 mm/h — ABNORMAL HIGH (ref 0–20)

## 2023-10-09 NOTE — Telephone Encounter (Signed)
Added to tomorrow's schedule.

## 2023-10-09 NOTE — Telephone Encounter (Signed)
Please add him on at 350 tomorrow February 21  Reason is pouchitis flare

## 2023-10-09 NOTE — Telephone Encounter (Signed)
Form printed and place in PCP office to be reviewed

## 2023-10-10 ENCOUNTER — Ambulatory Visit: Payer: 59 | Admitting: Internal Medicine

## 2023-10-10 ENCOUNTER — Encounter: Payer: Self-pay | Admitting: Internal Medicine

## 2023-10-10 VITALS — BP 110/74 | HR 97 | Ht 70.0 in | Wt 157.0 lb

## 2023-10-10 DIAGNOSIS — K50118 Crohn's disease of large intestine with other complication: Secondary | ICD-10-CM

## 2023-10-10 DIAGNOSIS — K91850 Pouchitis: Secondary | ICD-10-CM

## 2023-10-10 MED ORDER — DIAZEPAM 5 MG PO TABS: 5.0000 mg | ORAL_TABLET | Freq: Every evening | ORAL | 0 refills | Status: DC | PRN

## 2023-10-10 NOTE — Patient Instructions (Addendum)
Continue Prednisone taper as directed 40 mg for 5 days , 30 mg for 5 days, then 20 mg thereafter. Send my chart message to Dr.Gessner with updates on or around March 4th.   Continue Flagyl and Cipro as directed.  _____________ __________________________________________  If your blood pressure at your visit was 140/90 or greater, please contact your primary care physician to follow up on this.  _______________________________________________________  If you are age 19 or older, your body mass index should be between 23-30. Your Body mass index is 22.53 kg/m. If this is out of the aforementioned range listed, please consider follow up with your Primary Care Provider.  If you are age 63 or younger, your body mass index should be between 19-25. Your Body mass index is 22.53 kg/m. If this is out of the aformentioned range listed, please consider follow up with your Primary Care Provider.   ________________________________________________________  The [facility] providers would like to encourage you to use [facility] to communicate with providers for non-urgent requests or questions.  Due to long hold times on the telephone, sending your provider a message by [facility] may be a faster and more efficient way to get a response.  Please allow 48 business hours for a response.  Please remember that this is for non-urgent requests.  _______________________________________________________ Thank you for choosing me and [facility].  Dr.Carl Leone Payor

## 2023-10-10 NOTE — Progress Notes (Signed)
Charles [name] 52 y.o. [birth year] [id number]  Assessment & Plan:   Encounter Diagnoses  Name Primary?   Ileal pouchitis (HCC) Yes   Crohn's disease of large intestine with other complication (HCC)     Seems to be improving with prednisone and Cipro and metronidazole.  Will continue these.  5 days of 40 mg 5 days a 30 mg prednisone and then go down to 20 mg and update me.  He currently has a follow-up appointment for March 11 he may or may not keep that.  I think he will end up needing more chronic therapy though he is not inclined to pursue that at this point.  A pouchoscopy at some point in the next 2 to 3 months makes sense.  I am thinking it would be best to reassess when his symptoms are quiescent.  However if he fails to respond adequately we might need to do it sooner.  He requested diazepam to help him sleep better when he is on the prednisone.  Meds ordered this encounter  Medications   diazepam (VALIUM) 5 MG tablet    Sig: Take 1 tablet (5 mg total) by mouth at bedtime as needed for anxiety.    Dispense:  30 tablet    Refill:  0   Reassess sed rate at follow-up most likely.     Subjective:  Gastroenterology summary  Crohn's disease: Diagnosed in his 71s.  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  Has had problems with pouchitis and has been treated with steroids, mesalamine suppositories, Remicade, Humira, azathioprine (caused pancreatitis).  Azathioprine had been added to Humira in 2022 due to low level Humira antibodies.  He stopped Humira June 2023.  Thought to have had pulmonary complications of his Crohn's disease as well.    Has had recurrent small bowel obstructions last 2016  Last pouchoscopy 08/02/2022 patchy ulcerated areas as previous normal ileal biopsies, pouch with mild focal ulceration and active inflammation  GERD history of PPI therapy was able to wean off  Arthropathy-treated with  Celebrex   --------------------------------------------------------------------------------------------  Chief Complaint: Pouchitis flare  HPI 53 year old white man with a history of inflammatory bowel disease that turned out to be Crohn's colitis, status post total colectomy and ileoanal pouch complicated by recurrent pouchitis problems.  He had been well until recently, he had stopped his Humira in 2023 and went into observation mode.  Over the past couple of months he started to have some issues with some tenesmus and mucus discharge that ended up becoming much worse.  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  This intensified further.  We tried some Canasa suppositories that were started last week but problems worsened so he contacted me the other day and prednisone 40 mg daily started in Cipro 500 mg twice daily and metronidazole 500 mg twice daily.  He has been on about 3 days of that treatment and feels significantly better with less rectal pain and discharge and diarrhea.  No abdominal distention or symptoms consistent with obstruction though he has had those problems in the past not now.  Prednisone is making it difficult to sleep.  Labs performed yesterday: CBC with white count 11.1 otherwise normal Glucose 126 CMET otherwise normal ESR 67 CRP 4 Allergies  Allergen Reactions   Azathioprine Other (See Comments)    Serum sickness / acute hypersensitivity reaction with possible Acute pancreatitis   Mercaptopurine Nausea And Vomiting    Felt  ill within 2 days of initiating therapy.   Current Meds  Medication Sig   albuterol (VENTOLIN HFA) 108 (90 Base) MCG/ACT inhaler Inhale 1 puff into the lungs every 6 (six) hours as needed for wheezing or shortness of breath.   Ascorbic Acid (VITAMIN C PO) Take 1 tablet by mouth daily.   betamethasone dipropionate 0.05 % cream Apply topically.   Budesonide 90 MCG/ACT inhaler Inhale 1 puff into the lungs 2  (two) times daily. for wheezing or shortness of breath.   celecoxib (CELEBREX) 100 MG capsule TAKE 1 CAPSULE BY MOUTH 2 TIMES DAILY AS NEEDED   cholecalciferol (VITAMIN D) 1000 units tablet Take 2,000 Units by mouth daily.   ciprofloxacin (CIPRO) 500 MG tablet Take 1 tablet (500 mg total) by mouth 2 (two) times daily.   Cyanocobalamin (VITAMIN B-12 PO) Take 500 mcg by mouth daily.   diazepam (VALIUM) 5 MG tablet Take 1 tablet (5 mg total) by mouth at bedtime as needed for anxiety.   hydrocortisone 2.5 % cream Apply 1 Application topically 4 (four) times daily.   mesalamine (CANASA) 1000 MG suppository Place 1 suppository (1,000 mg total) rectally at bedtime.   metroNIDAZOLE (FLAGYL) 500 MG tablet Take 1 tablet (500 mg total) by mouth 2 (two) times daily.   montelukast (SINGULAIR) 10 MG tablet Take 1 tablet (10 mg total) by mouth at bedtime.   predniSONE (DELTASONE) 10 MG tablet Take 4 tablets (40 mg total) by mouth daily with breakfast.   rizatriptan (MAXALT) 5 MG tablet Take 1 tablet (5 mg total) by mouth as needed for migraine. May repeat in 2 hours if needed   Tiotropium Bromide-Olodaterol (STIOLTO RESPIMAT) 2.5-2.5 MCG/ACT AERS Inhale 2 puffs into the lungs daily.   Tiotropium Bromide-Olodaterol (STIOLTO RESPIMAT) 2.5-2.5 MCG/ACT AERS Inhale 2 puffs into the lungs daily.   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   Drug-induced acute pancreatitis without infection or necrosis 02/28/2021   Dysplastic nevi    GERD (gastroesophageal reflux disease)    Herniated disc    History of proctitis 2006   IBD (inflammatory bowel  disease)    Incisional hernia    Insomnia    Migraine headache    Morton's neuroma of right foot    Osteopenia    Pouchitis (HCC) 2006   Small bowel obstruction due to adhesions (HCC) 02/09/2015   Thyroid disease    Vitamin D deficiency    Past Surgical History:  Procedure Laterality Date   COLECTOMY     COLONOSCOPY W/ BIOPSIES  04/16/2006   crohn's colitis   FLEXIBLE SIGMOIDOSCOPY  2006 - 02/2014   ileocolitis, pouchitis.  with pouch ulcer.    FUNCTIONAL ENDOSCOPIC SINUS SURGERY  12/2003   partial ethmoidectomy.  Dr Annalee Genta   ileoanal pull-through     LUMBAR LAMINECTOMY/DECOMPRESSION MICRODISCECTOMY Right 10/13/2015   Procedure: Right Lumbar four- five Microdiskectomy;  Surgeon: Maeola Harman, MD;  Location: [facility];  Service: Neurosurgery;  Laterality: Right;  Right L4-5 Microdiskectomy   sacroilitis     SIGMOIDOSCOPY  01/2005   VENTRAL HERNIA  REPAIR  06/2014   Social History   Social History Narrative   Father a Diplomatic Services operational officer in [place].   Married to Dr. 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   Review of Systems As per HPI  Objective:   Physical Exam @BP  110/74   Pulse 97   Ht 5\' 10"  (1.778 m)   Wt 157 lb (71.2 kg)   BMI 22.53 kg/m @  General:  NAD Eyes:   anicteric Lungs:  clear Heart::  S1S2 no rubs, murmurs or gallops Abdomen:  soft and nontender, BS+     Data Reviewed:  See HPI

## 2023-10-22 ENCOUNTER — Encounter: Payer: Self-pay | Admitting: Internal Medicine

## 2023-10-22 DIAGNOSIS — K91850 Pouchitis: Secondary | ICD-10-CM

## 2023-10-24 MED ORDER — FLUCONAZOLE 100 MG PO TABS: ORAL_TABLET | ORAL | 0 refills | Status: DC

## 2023-10-24 NOTE — Telephone Encounter (Addendum)
 Spoke to him Diarrhea is improving  Oral thrush still an issue  To stay at 20 mg prednisone x 2 weeks total and update me and will decide next dose    Please set him up for a pouchoscopy (can use sigmoidoscopy slot) on  a Friday afternoon in late April or early-mid May  Prep is one enema 3-4 hours before case time Clear liquids day of procedure stopping when we usually do  Encounter Diagnosis  Name Primary?   Ileal pouchitis (HCC) Yes     Meds ordered this encounter  Medications   fluconazole (DIFLUCAN) 100 MG tablet    Sig: 2 tablets x 1 then 1 tablet dailt    Dispense:  8 tablet    Refill:  0

## 2023-10-28 ENCOUNTER — Ambulatory Visit: Payer: 59 | Admitting: Internal Medicine

## 2023-10-29 NOTE — Telephone Encounter (Signed)
 Left message on voicemail to call office.

## 2023-10-29 NOTE — Telephone Encounter (Signed)
 Spoke to pt told him I had to reprint the form. Dr. [name] signed it and I will e-mail it over to you shortly, you will need to complete your part again. Pt verbalized understanding. Form e-mailed to pt.

## 2023-10-30 ENCOUNTER — Encounter (HOSPITAL_BASED_OUTPATIENT_CLINIC_OR_DEPARTMENT_OTHER): Payer: Self-pay | Admitting: Pulmonary Disease

## 2023-10-30 ENCOUNTER — Ambulatory Visit (HOSPITAL_BASED_OUTPATIENT_CLINIC_OR_DEPARTMENT_OTHER): Payer: 59 | Admitting: Pulmonary Disease

## 2023-10-30 VITALS — BP 112/68 | HR 95 | Ht 70.0 in | Wt 155.7 lb

## 2023-10-30 DIAGNOSIS — R053 Chronic cough: Secondary | ICD-10-CM | POA: Diagnosis not present

## 2023-10-30 DIAGNOSIS — D869 Sarcoidosis, unspecified: Secondary | ICD-10-CM

## 2023-10-30 MED ORDER — STIOLTO RESPIMAT 2.5-2.5 MCG/ACT IN AERS: 2.0000 | INHALATION_SPRAY | Freq: Every day | RESPIRATORY_TRACT | 11 refills | Status: DC

## 2023-10-30 NOTE — Patient Instructions (Addendum)
 Probable sarcoid involving lung and skin and ?GI Mediastinal lymphadenopathy  --Off prednisone with congested/productive cough --Reviewed PET/CT 05/2023. Mild inflammation in mediastinal nodes. --Hold on bronchoscopy --No indication from pulmonary standpoint to start immunosuppressants --Scheduled for ENT. May benefit from CT sinus +/- sinus biopsy if indicated to rule out sarcoid involvement  Chronic cough - improved on prednisone --Hold on ICS/LABA on thrush for now. May reconsider if other inhalers ineffective --CONTINUE Stiolto TWO puffs in the morning daily during fall/spring season

## 2023-10-30 NOTE — Progress Notes (Signed)
 Subjective:   PATIENT ID: Charles [name] GENDER: male DOB: Jul 02, 1971, MRN: [id number]  Chief Complaint  Patient presents with   Follow-up    Sarcoidosis   Reason for Visit: Follow-up sarcoidosis  Charles [name] is a 53 year old male never smoker with mild asthma, UC/IBS/pouchitis s/p colectomy in 2004, chronic polypoid sinusitis s/p sinus surgery 2005, spondyloarthropathy, hx of remicaide and humira who presents for follow-up sarcoidosis.  Initial consult 04/25/23 Preceding his respiratory symptoms, he was diagnosed with idiopathic IBS after presented with abdominal cramping and diarrhea in 08/2002. He underwent colectomy in 02/2003 and requiring immunosuppressive therapy including prednisone. 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  In 2005 he developed severe coughing and seen by ENT due to unresponsiveness to conservative therapy. Did receive steroids which would suppress symptoms but would recur of medication so ended up being on prednisone 35-40 mg chronically. Underwent  bronchoscopy at [facility] in [place], [place] on 03/20/2004 for chronic cough with specimens from right mainstem and trachea described as "nodular inflammatory lesions". Per surgical path report final diagnosis: "Respiratory mucosa with squamous metaplasia and severe acute and chronic inflammation. [place] red special stain for amyloidosis: Negative." Path sent to [facility] considering that these findings could represent a manifestation of his underlying idiopathic inflammatory bowel disease. [facility] final read: "Lung, right mainstem, bronchus and tracheo, bronchoscopic biopsies: Severe acute and chronic inflammation with squamous metaplasia consistent with idiopathic inflammatory bowel disease-related airway disease"   Asthma was initially diagnosed in 1995 and  is usually triggered by pollen in the spring. Intermittently uses steroid inhaler (Advair 100, Qvar 40 tried and formoterol) during this time. Developed some recent post-nasal  drainage. Had developed a hacking cough with thick clear sputum. Has been using Wixela with some improvement but developed thrush. No shortness of breath. Has been on prednisone for 2-3 months. Started on prednisone 20 mg 1-2 weeks ago and tapered over this tim. Currently on 2.5 mg daily   He recently had a CT Cardiac scoring due to desire to work himself up since he turned 52. Was seen by Had CPET completed and normal. Fatigue has been years with thoughts this was related to his autoimmune.   He reports some sort of rash on left knee and periodically had skin rashes over the years.   Has been off Humira June 2023. Previously on remicaide 2009-2014. And before this Enbrel in ~2007 for 1 year. Has not tried methotrexate.  Enjoys running 2-3 days a week.   Family member with sarcoid, maternal side Aunt with tracheobronchial amyloidosis  06/03/23 Since our last he has been weaned off prednisone for 4-5 weeks. Occasional productive cough in the morning, worsening nasal congestion seems to affect. He presents for PET/CT scan review. At baseline no limitations in activity. Active runner.  10/30/23 Since our last visit PET/CT was low level so decision to hold on diagnostic testing and treatment. Has had persistent cough that mainly occurs in the morning. Has been traveling and reports sinus issues and requested ENT referral. Had pouchitis flare up and currently prednisone taper since 10/08/23 improved sinus issues. Completed cipro and flagyl. Has been compliant with Stiolto.  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   Drug-induced acute pancreatitis without infection or necrosis  02/28/2021   Dysplastic nevi    GERD (gastroesophageal reflux disease)    Herniated disc    History of proctitis 2006   IBD (inflammatory bowel disease)    Incisional hernia    Insomnia    Migraine headache    Morton's neuroma of right foot    Osteopenia    Pouchitis (HCC) 2006   Small bowel obstruction due to adhesions (HCC) 02/09/2015   Thyroid disease    Vitamin D deficiency      Family History  Problem Relation Age of Onset   Ulcerative colitis Mother    Graves' disease Mother    Colon cancer Paternal Grandmother    Brain cancer Paternal Grandmother        mets from colon   Esophageal cancer Neg Hx    Rectal cancer Neg Hx    Stomach cancer Neg Hx      Social History   Occupational History   Occupation: Nature conservation officer: [employer]  Tobacco Use   Smoking status: Never   Smokeless tobacco: Never  Vaping Use   Vaping status: Never Used  Substance and Sexual Activity   Alcohol use: Yes    Alcohol/week: 14.0 standard drinks of alcohol    Types: 14 Glasses of wine per week    Comment: 3-4 times/week   Drug use: No   Sexual activity: Yes    Allergies  Allergen Reactions   Azathioprine Other (See Comments)    Serum sickness / acute hypersensitivity reaction with possible Acute pancreatitis   Mercaptopurine Nausea And Vomiting    Felt ill within 2 days of initiating therapy.     Outpatient Medications Prior to Visit  Medication Sig Dispense Refill   albuterol (VENTOLIN HFA) 108 (90 Base) MCG/ACT inhaler Inhale 1 puff into the lungs every 6 (six) hours as needed for wheezing or shortness of breath. 8 g 2   Ascorbic Acid (VITAMIN C PO) Take 1 tablet by mouth daily.     betamethasone dipropionate 0.05 % cream Apply topically.     Budesonide 90 MCG/ACT inhaler Inhale 1 puff into the lungs 2 (two) times daily. for wheezing or shortness of breath. 1 each 5   calcipotriene (DOVONOX) 0.005 % cream Apply topically.     celecoxib (CELEBREX) 100 MG  capsule TAKE 1 CAPSULE BY MOUTH 2 TIMES DAILY AS NEEDED 60 capsule 5   cholecalciferol (VITAMIN D) 1000 units tablet Take 2,000 Units by mouth daily.     Cyanocobalamin (VITAMIN B-12 PO) Take 500 mcg by mouth daily.     diazepam (VALIUM) 5 MG tablet Take 1 tablet (5 mg total) by mouth at bedtime as needed for anxiety. 30 tablet 0   fluconazole (DIFLUCAN) 100 MG tablet 2 tablets x 1 then 1 tablet dailt 8 tablet 0   hydrocortisone 2.5 % cream Apply 1 Application topically 4 (four) times daily.     mesalamine (CANASA) 1000 MG suppository Place 1 suppository (1,000 mg total) rectally at bedtime. 30 suppository 1   montelukast (SINGULAIR) 10 MG tablet Take 1 tablet (10 mg total) by mouth at bedtime. 30 tablet 3   predniSONE (DELTASONE) 10 MG tablet Take 4 tablets (40 mg total) by mouth daily with breakfast. (Patient taking differently: Take 20 mg by mouth daily with breakfast.) 120 tablet 0   rizatriptan (MAXALT) 5 MG tablet Take 1 tablet (5 mg  total) by mouth as needed for migraine. May repeat in 2 hours if needed 30 tablet 0   rosuvastatin (CRESTOR) 5 MG tablet Take 1 tablet (5 mg total) by mouth daily. 90 tablet 3   Tiotropium Bromide-Olodaterol (STIOLTO RESPIMAT) 2.5-2.5 MCG/ACT AERS Inhale 2 puffs into the lungs daily. 4 g 5   traZODone (DESYREL) 50 MG tablet TAKE 1/2 TO 1 TABLET(25 TO 50 MG) BY MOUTH AT BEDTIME AS NEEDED FOR SLEEP 30 tablet 3   ciprofloxacin (CIPRO) 500 MG tablet Take 1 tablet (500 mg total) by mouth 2 (two) times daily. (Patient not taking: Reported on 10/30/2023) 20 tablet 0   metroNIDAZOLE (FLAGYL) 500 MG tablet Take 1 tablet (500 mg total) by mouth 2 (two) times daily. (Patient not taking: Reported on 10/30/2023) 20 tablet 0   Tiotropium Bromide-Olodaterol (STIOLTO RESPIMAT) 2.5-2.5 MCG/ACT AERS Inhale 2 puffs into the lungs daily. (Patient not taking: Reported on 10/30/2023)     No facility-administered medications prior to visit.    Review of Systems  Constitutional:   Negative for chills, diaphoresis, fever, malaise/fatigue and weight loss.  HENT:  Positive for congestion.   Respiratory:  Positive for cough. Negative for hemoptysis, sputum production, shortness of breath and wheezing.   Cardiovascular:  Negative for chest pain, palpitations and leg swelling.     Objective:   Vitals:   10/30/23 1328  BP: 112/68  Pulse: 95  SpO2: 98%  Weight: 155 lb 11.2 oz (70.6 kg)  Height: 5\' 10"  (1.778 m)    SpO2: 98 %  Physical Exam: General: Well-appearing, no acute distress HENT: [place], AT Eyes: EOMI, no scleral icterus Respiratory: Clear to auscultation bilaterally.  No crackles, wheezing or rales Cardiovascular: RRR, -M/R/G, no JVD Extremities:-Edema,-tenderness Neuro: AAO x4, CNII-XII grossly intact Psych: Normal mood, normal affect   Data Reviewed:  Imaging: CT Chest 03/05/23 - mediastinal and hilar lymph nodes with perilymphatic pulmonary parenchyma nodularity PET/CT 05/26/23 - Nonspecific lowe level hypermetabolic activity in mediastinal and hilar lymph nodes. Spleen as well. Stable pulmonary sarcoid.  PFT: [facility] spirometry 06/18/04 FVC 6.39 (118%) FEV1 4.68 (106%) Ratio 73   Interpretation: Normal spirometry  CPET 10/14/22 Normal functional capacity  Bronchoscopy:   Pathology:   Labs:    Latest Ref Rng & Units 10/09/2023    1:32 PM 07/22/2023    9:12 AM 02/25/2023   10:04 AM  CBC  WBC 4.0 - 10.5 K/uL 11.1  6.0  7.7   Hemoglobin 13.0 - 17.0 g/dL 16.1  09.6  04.5   Hematocrit 39.0 - 52.0 % 43.1  42.9  42.9   Platelets 150.0 - 400.0 K/uL 340.0  252.0  285.0       Latest Ref Rng & Units 10/09/2023    1:32 PM 07/22/2023    9:12 AM 02/25/2023   10:04 AM  CMP  Glucose 70 - 99 mg/dL 409  86  78   BUN 6 - 23 mg/dL 14  11  11    Creatinine 0.40 - 1.50 mg/dL 8.11  9.14  7.82   Sodium 135 - 145 mEq/L 136  136  138   Potassium 3.5 - 5.1 mEq/L 4.0  3.9  4.6   Chloride 96 - 112 mEq/L 98  102  100   CO2 19 - 32 mEq/L 28  27  31     Calcium 8.4 - 10.5 mg/dL 9.6  9.4  95.6   Total Protein 6.0 - 8.3 g/dL 7.8  7.3  7.0   Total Bilirubin 0.2 -  1.2 mg/dL 1.2  1.7  1.5   Alkaline Phos 39 - 117 U/L 70  60  65   AST 0 - 37 U/L 12  22  27    ALT 0 - 53 U/L 8  15  18     Normal labs reviewed as above with mild t bili which has improved     Assessment & Plan:   Discussion: 53 year old male never smoker with mild asthma, UC/IBS/pouchitis s/p colectomy in 2004, chronic polypoid sinusitis s/p sinus surgery 2005, spondyloarthropathy, hx of remicaide and humira who presents for follow-up sarcoidosis and asthma.  Pulmonary history preceded by idiopathic inflammatory bowel disease (failed 6 mercaptopurine and azathioprine) treated primarily with high dose prednisone. Extensive work-up including evaluation with bronchoscopy with biopsies sent to [facility] suggestive of extraintestinal manifestations of IBS and follow-up with [facility] in 2005. Notes from October and November 2005 reviewed.  Currently sarcoid with low level presence on PET/CT. Chronic cough persistent despite LAMA/LABA. Seems to only be responsive to steroids. ENT evaluation pending. Otherwise he does not have any other concerning pulmonary symptoms to warrant diagnostic testing or initiation of immunosuppressive agents.    Probable sarcoid involving lung and skin and ?GI Mediastinal lymphadenopathy  --Reviewed PET/CT 05/2023. Mild inflammation in mediastinal nodes. --Hold on bronchoscopy --No indication from pulmonary standpoint to start immunosuppressants at this time. --Scheduled for ENT. May benefit from CT sinus +/- sinus biopsy if indicated to rule out sarcoid involvement --If we needed to consider biologics in the future, may need to trial cellcept or increased dosing of Humira weekly. Will discuss options with Leone Payor at that time  Chronic cough - improved on prednisone --Hold on ICS/LABA due to concern for thrush. May reconsider if other inhalers  ineffective --CONTINUE Stiolto TWO puffs in the morning daily during fall/spring season   Health Maintenance Immunization History  Administered Date(s) Administered   Hepatitis A 08/24/2005, 02/04/2006   Hepatitis B 01/11/1992, 02/17/1992, 07/13/1992   Influenza Split 05/26/2012   Influenza Whole 05/13/2008, 04/30/2010, 04/30/2011   Influenza, Seasonal, Injecte, Preservative Fre 06/02/2023   Influenza,inj,Quad PF,6+ Mos 06/08/2013, 06/02/2014, 05/24/2015, 05/07/2016, 04/30/2017, 05/29/2018, 06/15/2019, 07/21/2020, 06/19/2021, 07/22/2022   MMR [birth year]19/1971, 03/21/1978   PFIZER(Purple Top)SARS-COV-2 Vaccination 10/28/2019, 11/18/2019, 05/08/2020   PPD Test 04/30/2011, 05/26/2012, 06/08/2013, 05/30/2014, 05/24/2015, 05/07/2016, 04/30/2017   Pneumococcal Polysaccharide-23 04/30/2010   Td 06/19/2006   Tdap 04/30/2011, 07/21/2020   Zoster Recombinant(Shingrix) 06/02/2023, 08/07/2023   CT Lung Screen - never smoker, not qualified  No orders of the defined types were placed in this encounter.  Meds ordered this encounter  Medications   Tiotropium Bromide-Olodaterol (STIOLTO RESPIMAT) 2.5-2.5 MCG/ACT AERS    Sig: Inhale 2 puffs into the lungs daily.    Dispense:  4 g    Refill:  11    Return in about 6 months (around 05/01/2024).  I have spent a total time of 32-minutes on the day of the appointment including chart review, data review, collecting history, coordinating care and discussing medical diagnosis and plan with the patient/family. Past medical history, allergies, medications were reviewed. Pertinent imaging, labs and tests included in this note have been reviewed and interpreted independently by me.  Amaryah Mallen Mechele Collin, MD [facility] 10/30/2023 1:41 PM  Office Number [phone]

## 2023-11-27 ENCOUNTER — Ambulatory Visit (INDEPENDENT_AMBULATORY_CARE_PROVIDER_SITE_OTHER): Payer: 59 | Admitting: Otolaryngology

## 2023-11-27 ENCOUNTER — Encounter (INDEPENDENT_AMBULATORY_CARE_PROVIDER_SITE_OTHER): Payer: Self-pay

## 2023-11-27 VITALS — BP 95/64 | HR 105 | Ht 70.0 in | Wt 152.0 lb

## 2023-11-27 DIAGNOSIS — R0982 Postnasal drip: Secondary | ICD-10-CM | POA: Diagnosis not present

## 2023-11-27 DIAGNOSIS — J343 Hypertrophy of nasal turbinates: Secondary | ICD-10-CM

## 2023-11-27 DIAGNOSIS — J324 Chronic pansinusitis: Secondary | ICD-10-CM | POA: Diagnosis not present

## 2023-11-27 DIAGNOSIS — R0981 Nasal congestion: Secondary | ICD-10-CM

## 2023-11-27 MED ORDER — AZELASTINE HCL 0.1 % NA SOLN: 2.0000 | Freq: Two times a day (BID) | NASAL | 12 refills | Status: AC

## 2023-11-27 NOTE — Patient Instructions (Addendum)
 Try Navage ([email or web address].[email or web address]) if you'd like Use flonase two sprays each nostril twice per day; and then immediately after, use astelin nasal sprays two sprays each nostril Use sinus rinses twice per day -- we will order you the budesonide (add a respule to each rinse)

## 2023-11-27 NOTE — Progress Notes (Signed)
 Dear Dr. [name], Here is my assessment for our mutual patient, Charles [name]. Thank you for allowing me the opportunity to care for your patient. Please do not hesitate to contact me should you have any other questions. Sincerely, Dr. Jovita Kussmaul  Otolaryngology Clinic Note Referring provider: Dr. [name] HPI:  Charles [name] is a 53 y.o. male kindly referred by Dr. 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 Thought to be due to his sinus disease, and did not improve after FESS. He then underwent bronch and noted to have extra-intestinal manifestation of IBD or potential sarcoid. He has been off humira for about a year, and was on steroids and cipro and flagyl recently (finished in late Feb/March) - off since about 10 days. He reports that over the past year or so, after stopping humira, has noted issues with his sinuses - he reports symptoms include multiple crusts, yellow discolored drainage, PND when he lays down, some facial pressure (light, but on the left side). He currently uses claritin, 4-5d per week of NeilMed Sinus rinses, flonase (daily) He does have a history of MRSA.   He does have seasonal allergy symptoms.Allergy testing was + to cats, dust mites ("long time ago"). He only had one FESS done in 2005. He does not think he has polyps.  He does not have significant cough currently or dysphonia. ASA sensivitiy: no  No issues with ears but prior hearing test did show some asymmetry.  H&N Surgery: no Personal or FHx of bleeding dz or anesthesia difficulty: no  GLP-1: no AP/AC: no  Tobacco: never.   PMHx: Inflammatory Bowel disease, Anxiety, HLD, Cutaneous Sarcoidosis, Chronic cough, Asthma  Independent Review of Additional Tests or Records:  Dr. [name] (07/22/2023 and 09/18/2023): noted crohn's disease, history of chronic sinusitis; Dx: Chronic sinusitis; Rx: ref to  ENT Dr. Leone Payor (10/10/2023): noted ileal pouchitis, improving on prednisone and cipro and flagyl Dr. Everardo All 06/03/2023: noted severe coughing and received steroids and was on chronic pred with bronch showing "nodular inflammatory lesions"; eventually under skin biopsy showed sarcoidosis; cough: holding ICS/LABA, stiolto Dr. 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 - h/o shooting, monitoring hearing   PET/CT 05/26/2023 independently interpreted with respect to sinuses:noted b/l chronic maxillary sinus, b/l ethmoid, and right frontal and b/l sphenoid MPT; no obvious proximal airway (up to subglottis) lesions noted CBC, CMP, Sed, CRP 10/09/2023: WBC 11.1 (normally no leukocytosis, Eos 100), BUN/Cr 14/1.07; ESR 67, CRP 4 PMH/Meds/All/SocHx/FamHx/ROS:   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   Drug-induced acute pancreatitis without infection or necrosis 02/28/2021   Dysplastic nevi    GERD (gastroesophageal reflux disease)    Herniated disc    History of proctitis 2006   IBD (inflammatory bowel disease)    Incisional hernia    Insomnia    Migraine headache    Morton's neuroma of right foot  Osteopenia    Pouchitis (HCC) 2006   Small bowel obstruction due to adhesions (HCC) 02/09/2015   Thyroid disease    Vitamin D deficiency      Past Surgical History:  Procedure Laterality Date   COLECTOMY     COLONOSCOPY W/ BIOPSIES  04/16/2006    crohn's colitis   FLEXIBLE SIGMOIDOSCOPY  2006 - 02/2014   ileocolitis, pouchitis.  with pouch ulcer.    FUNCTIONAL ENDOSCOPIC SINUS SURGERY  12/2003   partial ethmoidectomy.  Dr Annalee Genta   ileoanal pull-through     LUMBAR LAMINECTOMY/DECOMPRESSION MICRODISCECTOMY Right 10/13/2015   Procedure: Right Lumbar four- five Microdiskectomy;  Surgeon: Maeola Harman, MD;  Location: [facility];  Service: Neurosurgery;  Laterality: Right;  Right L4-5 Microdiskectomy   sacroilitis     SIGMOIDOSCOPY  01/2005   VENTRAL HERNIA REPAIR  06/2014    Family History  Problem Relation Age of Onset   Ulcerative colitis Mother    Luiz Blare' disease Mother    Colon cancer Paternal Grandmother    Brain cancer Paternal Grandmother        mets from colon   Esophageal cancer Neg Hx    Rectal cancer Neg Hx    Stomach cancer Neg Hx      Social Connections: Moderately Integrated (02/24/2023)   Social Connection and Isolation Panel [NHANES]    Frequency of Communication with Friends and Family: More than three times a week    Frequency of Social Gatherings with Friends and Family: Not on file    Attends Religious Services: More than 4 times per year    Active Member of [employer] or Organizations: Yes    Attends Engineer, structural: More than 4 times per year    Marital Status: Divorced      Current Outpatient Medications:    albuterol (VENTOLIN HFA) 108 (90 Base) MCG/ACT inhaler, Inhale 1 puff into the lungs every 6 (six) hours as needed for wheezing or shortness of breath., Disp: 8 g, Rfl: 2   Ascorbic Acid (VITAMIN C PO), Take 1 tablet by mouth daily., Disp: , Rfl:    azelastine (ASTELIN) 0.1 % nasal spray, Place 2 sprays into both nostrils 2 (two) times daily. Use in each nostril as directed, Disp: 30 mL, Rfl: 12   betamethasone dipropionate 0.05 % cream, Apply topically., Disp: , Rfl:    Budesonide 90 MCG/ACT inhaler, Inhale 1 puff into the lungs 2 (two) times daily. for wheezing or shortness of breath.,  Disp: 1 each, Rfl: 5   calcipotriene (DOVONOX) 0.005 % cream, Apply topically., Disp: , Rfl:    celecoxib (CELEBREX) 100 MG capsule, TAKE 1 CAPSULE BY MOUTH 2 TIMES DAILY AS NEEDED, Disp: 60 capsule, Rfl: 5   cholecalciferol (VITAMIN D) 1000 units tablet, Take 2,000 Units by mouth daily., Disp: , Rfl:    ciprofloxacin (CIPRO) 500 MG tablet, Take 1 tablet (500 mg total) by mouth 2 (two) times daily., Disp: 20 tablet, Rfl: 0   Cyanocobalamin (VITAMIN B-12 PO), Take 500 mcg by mouth daily., Disp: , Rfl:    diazepam (VALIUM) 5 MG tablet, Take 1 tablet (5 mg total) by mouth at bedtime as needed for anxiety., Disp: 30 tablet, Rfl: 0   fluconazole (DIFLUCAN) 100 MG tablet, 2 tablets x 1 then 1 tablet dailt, Disp: 8 tablet, Rfl: 0   hydrocortisone 2.5 % cream, Apply 1 Application topically 4 (four) times daily., Disp: , Rfl:    mesalamine (CANASA) 1000 MG suppository, Place 1 suppository (1,000 mg  total) rectally at bedtime., Disp: 30 suppository, Rfl: 1   metroNIDAZOLE (FLAGYL) 500 MG tablet, Take 1 tablet (500 mg total) by mouth 2 (two) times daily., Disp: 20 tablet, Rfl: 0   montelukast (SINGULAIR) 10 MG tablet, Take 1 tablet (10 mg total) by mouth at bedtime., Disp: 30 tablet, Rfl: 3   predniSONE (DELTASONE) 10 MG tablet, Take 4 tablets (40 mg total) by mouth daily with breakfast. (Patient taking differently: Take 20 mg by mouth daily with breakfast.), Disp: 120 tablet, Rfl: 0   rizatriptan (MAXALT) 5 MG tablet, Take 1 tablet (5 mg total) by mouth as needed for migraine. May repeat in 2 hours if needed, Disp: 30 tablet, Rfl: 0   rosuvastatin (CRESTOR) 5 MG tablet, Take 1 tablet (5 mg total) by mouth daily., Disp: 90 tablet, Rfl: 3   Tiotropium Bromide-Olodaterol (STIOLTO RESPIMAT) 2.5-2.5 MCG/ACT AERS, Inhale 2 puffs into the lungs daily., Disp: , Rfl:    Tiotropium Bromide-Olodaterol (STIOLTO RESPIMAT) 2.5-2.5 MCG/ACT AERS, Inhale 2 puffs into the lungs daily., Disp: 4 g, Rfl: 11   traZODone (DESYREL)  50 MG tablet, TAKE 1/2 TO 1 TABLET(25 TO 50 MG) BY MOUTH AT BEDTIME AS NEEDED FOR SLEEP, Disp: 30 tablet, Rfl: 3   Physical Exam:   BP 95/64 (BP Location: Left Arm, Patient Position: Sitting, Cuff Size: Normal)   Pulse (!) 105   Ht 5\' 10"  (1.778 m)   Wt 152 lb (68.9 kg)   SpO2 98%   BMI 21.81 kg/m   Salient findings:  CN II-XII intact  Bilateral EAC clear and TM intact with well pneumatized middle ear spaces Weber 512: mid Rinne 512: AC > BC b/l  Anterior rhinoscopy: Septum relatively midline; bilateral inferior turbinates with mild hypertrophy b/l; Nasal endoscopy was indicated to better evaluate the nose and paranasal sinuses, given the patient's history and exam findings, and is detailed below. No worrisome lesions of oral cavity/oropharynx No obviously palpable neck masses/lymphadenopathy/thyromegaly No respiratory distress or stridor  Seprately Identifiable Procedures:  Prior to initiating any procedures, risks/benefits/alternatives were explained to the patient and verbal consent obtained. PROCEDURE: Bilateral Diagnostic Rigid Nasal Endoscopy Pre-procedure diagnosis: Concern for chronic sinusitis Post-procedure diagnosis: same Indication: See pre-procedure diagnosis and physical exam above Complications: None apparent EBL: 0 mL Anesthesia: Lidocaine 4% and topical decongestant was topically sprayed in each nasal cavity  Description of Procedure:  Patient was identified. A rigid 30 degree endoscope was utilized to evaluate the sinonasal cavities, mucosa, sinus ostia and turbinates and septum.  Overall, signs of mucosal inflammation are noted.  Also noted are thick mucoid secretions bilateral middle meatus and left sphenoethmoid recess.  No polyps, or masses noted.  Difficult to visualize sinus openings given lateralization of MT Right Middle meatus: see above Right SE Recess: see above Left MM: see above Left SE Recess: see above Photodocumentation was obtained.     CPT  CODE -- 86578 - Mod 25   Impression & Plans:  Charles [name] is a 53 y.o. male with h/o Inflammatory Bowel disease, Cutaneous Sarcoidosis, Chronic cough, and Asthma  1. Chronic pansinusitis   2. Hypertrophy of both inferior nasal turbinates   3. Nasal congestion   4. Post-nasal drip    We had a long discussion about options today. Clearly long-term steroids given his gut health or antibiotics may not be beneficial, though he has tolerated it recently and in the past. I suspect given his medical problems, he may also have a stasis problem sinonasally. Given endo findings, we discussed  options including repeat abx/steroids or CT. Will start with maximal medical management and if does not improve, can escalate to abx rinses based on culture or repeat CT and possible discussion for revision FESS (which he wishes to avoid)  - Flonase BID; start Astelin BID - Increase nasal rinses to BID - add 0.5mg  budesonide respule to rinse - Continue PO antihistamine  F/u 8 weeks  See below regarding exact medications prescribed this encounter including dosages and route: Meds ordered this encounter  Medications   azelastine (ASTELIN) 0.1 % nasal spray    Sig: Place 2 sprays into both nostrils 2 (two) times daily. Use in each nostril as directed    Dispense:  30 mL    Refill:  12      Thank you for allowing me the opportunity to care for your patient. Please do not hesitate to contact me should you have any other questions.  Sincerely, Jovita Kussmaul, MD Otolaryngologist (ENT), [facility] Phone: [phone] Fax: [phone]  11/27/2023, 12:32 PM    I have personally spent 62 minutes involved in face-to-face and non-face-to-face activities for this patient on the day of the visit.  Professional time spent excludes any procedures performed but includes the following activities, in addition to those noted in the documentation: preparing to see the patient (extensive review of outside  documentation and results), performing a medically appropriate examination, counseling, ordering medications (astelin), documenting in the electronic health record, independently interpreting results (PET)

## 2023-12-26 ENCOUNTER — Encounter (HOSPITAL_COMMUNITY): Payer: Self-pay

## 2023-12-29 ENCOUNTER — Other Ambulatory Visit: Payer: Self-pay | Admitting: Internal Medicine

## 2023-12-29 MED ORDER — DIAZEPAM 5 MG PO TABS: 5.0000 mg | ORAL_TABLET | Freq: Every evening | ORAL | 0 refills | Status: DC | PRN

## 2023-12-29 NOTE — Telephone Encounter (Signed)
 I refilled but #10

## 2023-12-29 NOTE — Telephone Encounter (Signed)
 Called patient to inquire about refill request for valium .  He finished the prednisone  taper, which he had been using the valium  with to help with sleep. He is requesting additional valium  to be taken nightly this week to help him sleep leading up to his procedure on Friday.  Please advise

## 2024-01-01 NOTE — Progress Notes (Unsigned)
 [facility] History and Physical   Primary Care Physician:  Rodney Clamp, MD   Reason for Procedure:  History of Crohn's, pouchitis  Plan:    Pouchoscopy     HPI: Charles [name] is a 53 y.o. 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  He had done well for years and had stopped therapy but this year had recurrence of symptoms treated with steroids and antibiotics.   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   Drug-induced acute pancreatitis without infection or necrosis 02/28/2021   Dysplastic nevi    GERD (gastroesophageal reflux disease)    Herniated disc    History of proctitis 2006   IBD (inflammatory bowel disease)    Incisional hernia    Insomnia    Migraine headache    Morton's neuroma of right foot    Osteopenia    Pouchitis (HCC) 2006   Small bowel obstruction due to adhesions (HCC) 02/09/2015   Thyroid  disease    Vitamin D  deficiency     Past Surgical History:  Procedure Laterality Date   COLECTOMY     COLONOSCOPY W/ BIOPSIES  04/16/2006   crohn's colitis   FLEXIBLE SIGMOIDOSCOPY  2006 - 02/2014   ileocolitis, pouchitis.  with pouch ulcer.    FUNCTIONAL ENDOSCOPIC SINUS SURGERY  12/2003   partial ethmoidectomy.  Dr Melissa Spring   ileoanal pull-through     LUMBAR LAMINECTOMY/DECOMPRESSION MICRODISCECTOMY Right 10/13/2015   Procedure: Right Lumbar four- five Microdiskectomy;  Surgeon: Manya Sells, MD;  Location: [facility];  Service: Neurosurgery;  Laterality: Right;  Right L4-5 Microdiskectomy   sacroilitis     SIGMOIDOSCOPY  01/2005   VENTRAL HERNIA REPAIR  06/2014    Prior to Admission medications   Medication Sig Start Date End Date Taking?  Authorizing Provider  albuterol  (VENTOLIN  HFA) 108 (90 Base) MCG/ACT inhaler Inhale 1 puff into the lungs every 6 (six) hours as needed for wheezing or shortness of breath. 11/22/22   Rodney Clamp, MD  Ascorbic Acid (VITAMIN C PO) Take 1 tablet by mouth daily.    [provider]  azelastine  (ASTELIN ) 0.1 % nasal spray Place 2 sprays into both nostrils 2 (two) times daily. Use in each nostril as directed 11/27/23   Patel, Kunjan B, MD  betamethasone dipropionate 0.05 % cream Apply topically. 02/11/22   [provider]  Budesonide  90 MCG/ACT inhaler Inhale 1 puff into the lungs 2 (two) times daily. for wheezing or shortness of breath. 11/25/22   Parker, Caleb M, MD  calcipotriene (DOVONOX) 0.005 % cream Apply topically. 02/11/22   [provider]  celecoxib  (CELEBREX ) 100 MG capsule TAKE 1 CAPSULE BY MOUTH 2 TIMES DAILY AS NEEDED 09/01/23   Rodney Clamp, MD  cholecalciferol (VITAMIN D ) 1000 units tablet Take 2,000 Units by mouth daily.    [provider]  ciprofloxacin  (CIPRO ) 500 MG tablet Take 1 tablet (500 mg total) by mouth 2 (two) times daily. 10/08/23   Kenney Peacemaker, MD  Cyanocobalamin  (VITAMIN B-12 PO) Take 500 mcg by mouth daily.    [provider]  diazepam  (VALIUM ) 5 MG tablet Take 1 tablet (5 mg total) by  mouth at bedtime as needed for anxiety. 12/29/23   Kenney Peacemaker, MD  fluconazole  (DIFLUCAN ) 100 MG tablet 2 tablets x 1 then 1 tablet dailt 10/24/23   Kenney Peacemaker, MD  hydrocortisone  2.5 % cream Apply 1 Application topically 4 (four) times daily. 02/11/22   [provider]  mesalamine  (CANASA ) 1000 MG suppository Place 1 suppository (1,000 mg total) rectally at bedtime. 10/02/23   Kenney Peacemaker, MD  metroNIDAZOLE  (FLAGYL ) 500 MG tablet Take 1 tablet (500 mg total) by mouth 2 (two) times daily. 10/08/23   Kenney Peacemaker, MD  montelukast  (SINGULAIR ) 10 MG tablet Take 1 tablet (10 mg total) by mouth at bedtime. 11/19/22   Rodney Clamp,  MD  predniSONE  (DELTASONE ) 10 MG tablet Take 4 tablets (40 mg total) by mouth daily with breakfast. Patient taking differently: Take 20 mg by mouth daily with breakfast. 10/08/23   Kenney Peacemaker, MD  rizatriptan  (MAXALT ) 5 MG tablet Take 1 tablet (5 mg total) by mouth as needed for migraine. May repeat in 2 hours if needed 04/25/23   Rodney Clamp, MD  rosuvastatin  (CRESTOR ) 5 MG tablet Take 1 tablet (5 mg total) by mouth daily. 09/20/22   Bensimhon, Daniel R, MD  Tiotropium Bromide-Olodaterol (STIOLTO RESPIMAT ) 2.5-2.5 MCG/ACT AERS Inhale 2 puffs into the lungs daily. 06/03/23   Quillian Brunt, MD  Tiotropium Bromide-Olodaterol (STIOLTO RESPIMAT ) 2.5-2.5 MCG/ACT AERS Inhale 2 puffs into the lungs daily. 10/30/23   Quillian Brunt, MD  traZODone  (DESYREL ) 50 MG tablet TAKE 1/2 TO 1 TABLET(25 TO 50 MG) BY MOUTH AT BEDTIME AS NEEDED FOR SLEEP 01/10/23   Rodney Clamp, MD    Current Outpatient Medications  Medication Sig Dispense Refill   azelastine  (ASTELIN ) 0.1 % nasal spray Place 2 sprays into both nostrils 2 (two) times daily. Use in each nostril as directed 30 mL 12   celecoxib  (CELEBREX ) 100 MG capsule TAKE 1 CAPSULE BY MOUTH 2 TIMES DAILY AS NEEDED 60 capsule 5   cholecalciferol (VITAMIN D ) 1000 units tablet Take 2,000 Units by mouth daily.     Cyanocobalamin  (VITAMIN B-12 PO) Take 500 mcg by mouth daily.     diazepam  (VALIUM ) 5 MG tablet Take 1 tablet (5 mg total) by mouth at bedtime as needed for anxiety. 10 tablet 0   Tiotropium Bromide-Olodaterol (STIOLTO RESPIMAT ) 2.5-2.5 MCG/ACT AERS Inhale 2 puffs into the lungs daily.     Tiotropium Bromide-Olodaterol (STIOLTO RESPIMAT ) 2.5-2.5 MCG/ACT AERS Inhale 2 puffs into the lungs daily. 4 g 11   albuterol  (VENTOLIN  HFA) 108 (90 Base) MCG/ACT inhaler Inhale 1 puff into the lungs every 6 (six) hours as needed for wheezing or shortness of breath. 8 g 2   Ascorbic Acid (VITAMIN C PO) Take 1 tablet by mouth daily.     betamethasone dipropionate  0.05 % cream Apply topically.     Budesonide  90 MCG/ACT inhaler Inhale 1 puff into the lungs 2 (two) times daily. for wheezing or shortness of breath. (Patient not taking: Reported on 01/02/2024) 1 each 5   calcipotriene (DOVONOX) 0.005 % cream Apply topically. (Patient not taking: Reported on 01/02/2024)     ciprofloxacin  (CIPRO ) 500 MG tablet Take 1 tablet (500 mg total) by mouth 2 (two) times daily. (Patient not taking: Reported on 01/02/2024) 20 tablet 0   fluconazole  (DIFLUCAN ) 100 MG tablet 2 tablets x 1 then 1 tablet dailt (Patient not taking: Reported on 01/02/2024) 8 tablet 0   hydrocortisone  2.5 % cream  Apply 1 Application topically 4 (four) times daily. (Patient not taking: Reported on 01/02/2024)     mesalamine  (CANASA ) 1000 MG suppository Place 1 suppository (1,000 mg total) rectally at bedtime. (Patient not taking: Reported on 01/02/2024) 30 suppository 1   metroNIDAZOLE  (FLAGYL ) 500 MG tablet Take 1 tablet (500 mg total) by mouth 2 (two) times daily. (Patient not taking: Reported on 01/02/2024) 20 tablet 0   montelukast  (SINGULAIR ) 10 MG tablet Take 1 tablet (10 mg total) by mouth at bedtime. (Patient not taking: Reported on 01/02/2024) 30 tablet 3   predniSONE  (DELTASONE ) 10 MG tablet Take 4 tablets (40 mg total) by mouth daily with breakfast. (Patient not taking: Reported on 01/02/2024) 120 tablet 0   rizatriptan  (MAXALT ) 5 MG tablet Take 1 tablet (5 mg total) by mouth as needed for migraine. May repeat in 2 hours if needed 30 tablet 0   rosuvastatin  (CRESTOR ) 5 MG tablet Take 1 tablet (5 mg total) by mouth daily. (Patient not taking: Reported on 01/02/2024) 90 tablet 3   traZODone  (DESYREL ) 50 MG tablet TAKE 1/2 TO 1 TABLET(25 TO 50 MG) BY MOUTH AT BEDTIME AS NEEDED FOR SLEEP (Patient not taking: Reported on 01/02/2024) 30 tablet 3   Current Facility-Administered Medications  Medication Dose Route Frequency Provider Last Rate Last Admin   0.9 %  sodium chloride  infusion  500 mL Intravenous  Once Kenney Peacemaker, MD        Allergies as of 01/02/2024 - Review Complete 01/02/2024  Allergen Reaction Noted   Azathioprine  Other (See Comments) 02/28/2021   Mercaptopurine Nausea And Vomiting 02/17/2014    Family History  Problem Relation Age of Onset   Ulcerative colitis Mother    Graves' disease Mother    Colon cancer Paternal Grandmother    Brain cancer Paternal Grandmother        mets from colon   Esophageal cancer Neg Hx    Rectal cancer Neg Hx    Stomach cancer Neg Hx     Social History   Socioeconomic History   Marital status: Divorced    Spouse name: Not on file   Number of children: 2   Years of education: 16 at least   Highest education level: Master's degree (e.g., MA, MS, MEng, MEd, MSW, MBA)  Occupational History   Occupation: Nature conservation officer: [employer]  Tobacco Use   Smoking status: Never   Smokeless tobacco: Never  Vaping Use   Vaping status: Never Used  Substance and Sexual Activity   Alcohol use: Yes    Alcohol/week: 14.0 standard drinks of alcohol    Types: 14 Glasses of wine per week    Comment: 3-4 times/week   Drug use: No   Sexual activity: Yes  Other Topics Concern   Not on file  Social History Narrative   Father a pulmonologist in [place].   Married to Dr. 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   + EtOH   No tobacco   No drugs   Social Drivers of Corporate investment banker Strain: Medium Risk (02/24/2023)   Overall Financial Resource Strain (CARDIA)    Difficulty of Paying Living Expenses: Somewhat hard  Food Insecurity: No Food Insecurity (02/24/2023)   Hunger Vital Sign    Worried About Running Out of Food in the Last Year:  Never true    Ran Out of Food in the Last Year: Never true  Transportation Needs: No Transportation Needs  (02/24/2023)   PRAPARE - Administrator, Civil Service (Medical): No    Lack of Transportation (Non-Medical): No  Physical Activity: Sufficiently Active (02/24/2023)   Exercise Vital Sign    Days of Exercise per Week: 6 days    Minutes of Exercise per Session: 40 min  Stress: No Stress Concern Present (02/24/2023)   [employer] of Occupational Health - Occupational Stress Questionnaire    Feeling of Stress : Only a little  Social Connections: Moderately Integrated (02/24/2023)   Social Connection and Isolation Panel [NHANES]    Frequency of Communication with Friends and Family: More than three times a week    Frequency of Social Gatherings with Friends and Family: Not on file    Attends Religious Services: More than 4 times per year    Active Member of [employer] or Organizations: Yes    Attends Engineer, structural: More than 4 times per year    Marital Status: Divorced  Catering manager Violence: Not on file    Review of Systems:  All other review of systems negative except as mentioned in the HPI.  Physical Exam: Vital signs BP 120/69   Pulse (!) 105   Temp 98.2 F (36.8 C) (Temporal)   Ht 5\' 10"  (1.778 m)   Wt 157 lb (71.2 kg)   SpO2 96%   BMI 22.53 kg/m   General:   Alert,  Well-developed, well-nourished, pleasant and cooperative in NAD Lungs:  Clear throughout to auscultation.   Heart:  Regular rate and rhythm; no murmurs, clicks, rubs,  or gallops. Abdomen:  Soft, nontender and nondistended. Normal bowel sounds.   Neuro/Psych:  Alert and cooperative. Normal mood and affect. A and O x 3   @Shrihan Putt  Tammie Fall, MD, [facility] [phone] (pager) 01/02/2024 3:37 PM@

## 2024-01-02 ENCOUNTER — Encounter: Payer: Self-pay | Admitting: Internal Medicine

## 2024-01-02 ENCOUNTER — Ambulatory Visit: Admitting: Internal Medicine

## 2024-01-02 VITALS — BP 118/78 | HR 79 | Temp 98.2°F | Resp 20 | Ht 70.0 in | Wt 157.0 lb

## 2024-01-02 DIAGNOSIS — K91850 Pouchitis: Secondary | ICD-10-CM

## 2024-01-02 MED ORDER — SODIUM CHLORIDE 0.9 % IV SOLN: 500.0000 mL | Freq: Once | INTRAVENOUS | Status: DC

## 2024-01-02 NOTE — Op Note (Addendum)
 [facility] Patient Name: Charles [name] Procedure Date: 01/02/2024 2:59 PM MRN: [id number] Endoscopist: Kenney Peacemaker , MD, [id number] Age: 53 Referring MD:  Date of Birth: April 11, 1971 Gender: Male Account #: [id number] Procedure:                Pouchoscopy Indications:              History of total proctocolectomy Medicines:                Monitored Anesthesia Care Procedure:                Pre-Anesthesia Assessment:                           - Prior to the procedure, a History and Physical                            was performed, and patient medications and                            allergies were reviewed. The patient's tolerance of                            previous anesthesia was also reviewed. The risks                            and benefits of the procedure and the sedation                            options and risks were discussed with the patient.                            All questions were answered, and informed consent                            was obtained. Prior Anticoagulants: The patient has                            taken no anticoagulant or antiplatelet agents. ASA                            Grade Assessment: II - A patient with mild systemic                            disease. After reviewing the risks and benefits,                            the patient was deemed in satisfactory condition to                            undergo the procedure.                           After obtaining informed consent, the endoscope was  passed under direct vision. Throughout the                            procedure, the patient's blood pressure, pulse, and                            oxygen  saturations were monitored continuously. The                            Olympus Scope [id number] was introduced through the                            ileoanal anastomosis via the anus and advanced to                            the ileoanal pouch and into the  neo-terminal ileum.                            The procedure was performed without difficulty. The                            patient tolerated the procedure well. The quality                            of the bowel preparation was good. Scope In: 3:48:17 PM Scope Out: 3:53:46 PM Total Procedure Duration: 0 hours 5 minutes 29 seconds  Findings:                 The perianal and digital rectal examinations were                            normal.                           Inflammation characterized by erythema, friability,                            mucus and serpentine ulcerations was found in the                            ileoanal pouch. This was graded as Pouchitis                            Disease Activity Index (Endoscopic) Score 4.                            Biopsies were taken with a cold forceps for                            histology. Verification of patient identification                            for the specimen was done. Estimated blood loss was  minimal.                           The neo-terminal ileum appeared normal. Biopsies                            were taken with a cold forceps for histology.                            Verification of patient identification for the                            specimen was done. Estimated blood loss was minimal. Complications:            No immediate complications. Estimated Blood Loss:     Estimated blood loss was minimal. 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 Biopsied. Looks worse than                            last exam 07/2022                           - The examined portion of the ileum was normal.                            Biopsied. Recommendation:           - Patient has a contact number available for                            emergencies. The signs and symptoms of potential                            delayed  complications were discussed with the                            patient. Return to normal activities tomorrow.                            Written discharge instructions were provided to the                            patient.                           - Resume previous diet.                           - Await pathology results. Patient is currently w/o                            significant symptoms. Earlier this year had major  flare treated with cipro , metronidazole  and                            prednisone . Also tried mesalamine  suppositories. Hx                            of Remicade  and Humira , stopped therapy (Humira )                            01/2022. Kenney Peacemaker, MD 01/02/2024 4:11:14 PM This report has been signed electronically.

## 2024-01-02 NOTE — Patient Instructions (Addendum)
 The pouch is inflamed.  Biopsies taken - will discuss where to go from here when results are in.  I appreciate the opportunity to care for you. Kenney Peacemaker, MD, [facility]  Resume previous diet Await pathology results.   YOU HAD AN ENDOSCOPIC PROCEDURE TODAY AT THE [facility]:   Refer to the procedure report that was given to you for any specific questions about what was found during the examination.  If the procedure report does not answer your questions, please call your gastroenterologist to clarify.  If you requested that your care partner not be given the details of your procedure findings, then the procedure report has been included in a sealed envelope for you to review at your convenience later.  YOU SHOULD EXPECT: Some feelings of bloating in the abdomen. Passage of more gas than usual.  Walking can help get rid of the air that was put into your GI tract during the procedure and reduce the bloating. If you had a lower endoscopy (such as a colonoscopy or flexible sigmoidoscopy) you may notice spotting of blood in your stool or on the toilet paper. If you underwent a bowel prep for your procedure, you may not have a normal bowel movement for a few days.  Please Note:  You might notice some irritation and congestion in your nose or some drainage.  This is from the oxygen  used during your procedure.  There is no need for concern and it should clear up in a day or so.  SYMPTOMS TO REPORT IMMEDIATELY:  Following lower endoscopy (colonoscopy or flexible sigmoidoscopy):  Excessive amounts of blood in the stool  Significant tenderness or worsening of abdominal pains  Swelling of the abdomen that is new, acute  Fever of 100F or higher  For urgent or emergent issues, a gastroenterologist can be reached at any hour by calling [phone]. Do not use MyChart messaging for urgent concerns.   DIET:  We do recommend a small meal at first, but then you may proceed to your regular  diet.  Drink plenty of fluids but you should avoid alcoholic beverages for 24 hours.  ACTIVITY:  You should plan to take it easy for the rest of today and you should NOT DRIVE or use heavy machinery until tomorrow (because of the sedation medicines used during the test).    FOLLOW UP: Our staff will call the number listed on your records the next business day following your procedure.  We will call around 7:15- 8:00 am to check on you and address any questions or concerns that you may have regarding the information given to you following your procedure. If we do not reach you, we will leave a message.     If any biopsies were taken you will be contacted by phone or by letter within the next 1-3 weeks.  Please call us  at [phone] if you have not heard about the biopsies in 3 weeks.   SIGNATURES/CONFIDENTIALITY: You and/or your care partner have signed paperwork which will be entered into your electronic medical record.  These signatures attest to the fact that that the information above on your After Visit Summary has been reviewed and is understood.  Full responsibility of the confidentiality of this discharge information lies with you and/or your care-partner.

## 2024-01-02 NOTE — Progress Notes (Signed)
 Report given to PACU, vss

## 2024-01-02 NOTE — Progress Notes (Signed)
 Called to room to assist during endoscopic procedure.  Patient ID and intended procedure confirmed with present staff. Received instructions for my participation in the procedure from the performing physician.

## 2024-01-05 ENCOUNTER — Telehealth: Payer: Self-pay | Admitting: *Deleted

## 2024-01-05 NOTE — Telephone Encounter (Signed)
 Attempted post procedure follow up call.  No answer - LVM.

## 2024-01-06 LAB — SURGICAL PATHOLOGY

## 2024-01-07 ENCOUNTER — Ambulatory Visit: Payer: Self-pay | Admitting: Internal Medicine

## 2024-01-16 ENCOUNTER — Encounter: Payer: Self-pay | Admitting: Internal Medicine

## 2024-02-23 ENCOUNTER — Ambulatory Visit (INDEPENDENT_AMBULATORY_CARE_PROVIDER_SITE_OTHER): Admitting: Otolaryngology

## 2024-03-02 ENCOUNTER — Encounter: Payer: Self-pay | Admitting: Family Medicine

## 2024-03-02 MED ORDER — RIZATRIPTAN BENZOATE 5 MG PO TABS: 5.0000 mg | ORAL_TABLET | ORAL | 0 refills | Status: DC | PRN

## 2024-04-12 ENCOUNTER — Ambulatory Visit (INDEPENDENT_AMBULATORY_CARE_PROVIDER_SITE_OTHER): Admitting: Otolaryngology

## 2024-04-12 ENCOUNTER — Encounter (INDEPENDENT_AMBULATORY_CARE_PROVIDER_SITE_OTHER): Payer: Self-pay | Admitting: Otolaryngology

## 2024-04-12 VITALS — BP 130/80 | HR 89 | Ht 70.0 in | Wt 152.0 lb

## 2024-04-12 DIAGNOSIS — R0981 Nasal congestion: Secondary | ICD-10-CM

## 2024-04-12 DIAGNOSIS — Z9889 Other specified postprocedural states: Secondary | ICD-10-CM

## 2024-04-12 DIAGNOSIS — J324 Chronic pansinusitis: Secondary | ICD-10-CM

## 2024-04-12 DIAGNOSIS — J343 Hypertrophy of nasal turbinates: Secondary | ICD-10-CM

## 2024-04-12 DIAGNOSIS — R0982 Postnasal drip: Secondary | ICD-10-CM | POA: Diagnosis not present

## 2024-04-12 NOTE — Progress Notes (Signed)
 Dear Dr. Kennyth, Here is my assessment for our mutual patient, Charles [name]. Thank you for allowing me the opportunity to care for your patient. Please do not hesitate to contact me should you have any other questions. Sincerely, Dr. Eldora Blanch  Otolaryngology Clinic Note Referring provider: Dr. Kennyth HPI:  Charles [name] is a 53 y.o. male kindly referred by Dr. 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 Thought to be due to his sinus disease, and did not improve after FESS. He then underwent bronch and noted to have extra-intestinal manifestation of IBD or potential sarcoid. He has been off humira  for about a year, and was on steroids and cipro  and flagyl  recently (finished in late Feb/March) - off since about 10 days. He reports that over the past year or so, after stopping humira , has noted issues with his sinuses - he reports symptoms include multiple crusts, yellow discolored drainage, PND when he lays down, some facial pressure (light, but on the left side). He currently uses claritin, 4-5d per week of NeilMed Sinus rinses, flonase (daily) He does have a history of MRSA.   He does have seasonal allergy symptoms.Allergy testing was + to cats, dust mites (long time ago). He only had one FESS done in 2005. He does not think he has polyps.  He does not have significant cough currently or dysphonia. ASA sensivitiy: no  No issues with ears but prior hearing test did show some asymmetry.  --------------------------------------------------------- 04/12/2024 He reports that that he is actually doing quite well. He reports that he has been doing flonase daily and daily rinses. He did have a recent URI/perhaps COVID and no fevers. He did have a fair amount of congestion on the right and he thought he was going to have a sinus infection, and got low dose prednisone . This helped him  significantly. No discolored drainage, facial pressure/pain. Using claritin, flonase, daily sinus rinse. Tried astelin , tasted terrible.   H&N Surgery: b/l FESS Personal or FHx of bleeding dz or anesthesia difficulty: no  GLP-1: no AP/AC: no  Tobacco: never.   PMHx: Inflammatory Bowel disease, Anxiety, HLD, Cutaneous Sarcoidosis, Chronic cough, Asthma  Independent Review of Additional Tests or Records:  Dr. Kennyth (07/22/2023 and 09/18/2023): noted crohn's disease, history of chronic sinusitis; Dx: Chronic sinusitis; Rx: ref to ENT Dr. Avram (10/10/2023): noted ileal pouchitis, improving on prednisone  and cipro  and flagyl  Dr. Kassie 06/03/2023: noted severe coughing and received steroids and was on chronic pred with bronch showing nodular inflammatory lesions; eventually under skin biopsy showed sarcoidosis; cough: holding ICS/LABA, stiolto Dr. 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 - h/o shooting, monitoring hearing   PET/CT 05/26/2023 independently interpreted with respect to sinuses:noted b/l chronic maxillary sinus, b/l ethmoid, and right frontal and b/l sphenoid MPT; no obvious proximal airway (up to subglottis) lesions noted CBC, CMP, Sed, CRP 10/09/2023: WBC 11.1 (normally no leukocytosis, Eos 100), BUN/Cr 14/1.07; ESR 67, CRP 4 PMH/Meds/All/SocHx/FamHx/ROS:   Past Medical History:  Diagnosis Date   Allergy    Anxiety    Arthritis    Arthropathy    and scroilitis   Asthma    Blood transfusion without reported  diagnosis    Chronic drug-induced interstitial lung disorders (HCC)    Crohn's ileocolitis (HCC) 08/29/2008   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   Drug-induced acute  pancreatitis without infection or necrosis 02/28/2021   Dysplastic nevi    GERD (gastroesophageal reflux disease)    Herniated disc    History of proctitis 2006   IBD (inflammatory bowel disease)    Incisional hernia    Insomnia    Migraine headache    Morton's neuroma of right foot    Osteopenia    Pouchitis (HCC) 2006   Small bowel obstruction due to adhesions (HCC) 02/09/2015   Thyroid  disease    Vitamin D  deficiency      Past Surgical History:  Procedure Laterality Date   COLECTOMY     COLONOSCOPY W/ BIOPSIES  04/16/2006   crohn's colitis   FLEXIBLE SIGMOIDOSCOPY  2006 - 02/2014   ileocolitis, pouchitis.  with pouch ulcer.    FUNCTIONAL ENDOSCOPIC SINUS SURGERY  12/2003   partial ethmoidectomy.  Dr Mable   ileoanal pull-through     LUMBAR LAMINECTOMY/DECOMPRESSION MICRODISCECTOMY Right 10/13/2015   Procedure: Right Lumbar four- five Microdiskectomy;  Surgeon: Fairy Levels, MD;  Location: [facility];  Service: Neurosurgery;  Laterality: Right;  Right L4-5 Microdiskectomy   sacroilitis     SIGMOIDOSCOPY  01/2005   VENTRAL HERNIA REPAIR  06/2014    Family History  Problem Relation Age of Onset   Ulcerative colitis Mother    Yvone' disease Mother    Colon cancer Paternal Grandmother    Brain cancer Paternal Grandmother        mets from colon   Esophageal cancer Neg Hx    Rectal cancer Neg Hx    Stomach cancer Neg Hx      Social Connections: Moderately Integrated (02/24/2023)   Social Connection and Isolation Panel    Frequency of Communication with Friends and Family: More than three times a week    Frequency of Social Gatherings with Friends and Family: Not on file    Attends Religious Services: More than 4 times per year    Active Member of [employer] or Organizations: Yes    Attends Engineer, structural: More than 4 times per year    Marital Status: Divorced      Current Outpatient Medications:    albuterol  (VENTOLIN  HFA) 108 (90 Base) MCG/ACT inhaler,  Inhale 1 puff into the lungs every 6 (six) hours as needed for wheezing or shortness of breath., Disp: 8 g, Rfl: 2   Ascorbic Acid (VITAMIN C PO), Take 1 tablet by mouth daily., Disp: , Rfl:    azelastine  (ASTELIN ) 0.1 % nasal spray, Place 2 sprays into both nostrils 2 (two) times daily. Use in each nostril as directed, Disp: 30 mL, Rfl: 12   betamethasone dipropionate 0.05 % cream, Apply topically., Disp: , Rfl:    celecoxib  (CELEBREX ) 100 MG capsule, TAKE 1 CAPSULE BY MOUTH 2 TIMES DAILY AS NEEDED, Disp: 60 capsule, Rfl: 5   cholecalciferol (VITAMIN D ) 1000 units tablet, Take 2,000 Units by mouth daily., Disp: , Rfl:    Cyanocobalamin  (VITAMIN B-12 PO), Take 500 mcg by mouth daily., Disp: , Rfl:    diazepam  (VALIUM ) 5 MG tablet, Take 1 tablet (5 mg total) by mouth at bedtime as needed for anxiety., Disp: 10 tablet, Rfl: 0   rizatriptan  (MAXALT ) 5 MG tablet, Take 1 tablet (5 mg total) by mouth as needed for migraine. May repeat in 2 hours if needed, Disp: 30 tablet,  Rfl: 0   Tiotropium Bromide-Olodaterol (STIOLTO RESPIMAT ) 2.5-2.5 MCG/ACT AERS, Inhale 2 puffs into the lungs daily., Disp: , Rfl:    Tiotropium Bromide-Olodaterol (STIOLTO RESPIMAT ) 2.5-2.5 MCG/ACT AERS, Inhale 2 puffs into the lungs daily., Disp: 4 g, Rfl: 11   Budesonide  90 MCG/ACT inhaler, Inhale 1 puff into the lungs 2 (two) times daily. for wheezing or shortness of breath. (Patient not taking: Reported on 04/12/2024), Disp: 1 each, Rfl: 5   calcipotriene (DOVONOX) 0.005 % cream, Apply topically. (Patient not taking: Reported on 04/12/2024), Disp: , Rfl:    hydrocortisone  2.5 % cream, Apply 1 Application topically 4 (four) times daily. (Patient not taking: Reported on 04/12/2024), Disp: , Rfl:    mesalamine  (CANASA ) 1000 MG suppository, Place 1 suppository (1,000 mg total) rectally at bedtime. (Patient not taking: Reported on 04/12/2024), Disp: 30 suppository, Rfl: 1   montelukast  (SINGULAIR ) 10 MG tablet, Take 1 tablet (10 mg total) by  mouth at bedtime. (Patient not taking: Reported on 04/12/2024), Disp: 30 tablet, Rfl: 3   rosuvastatin  (CRESTOR ) 5 MG tablet, Take 1 tablet (5 mg total) by mouth daily. (Patient not taking: Reported on 04/12/2024), Disp: 90 tablet, Rfl: 3   traZODone  (DESYREL ) 50 MG tablet, TAKE 1/2 TO 1 TABLET(25 TO 50 MG) BY MOUTH AT BEDTIME AS NEEDED FOR SLEEP (Patient not taking: Reported on 04/12/2024), Disp: 30 tablet, Rfl: 3   Physical Exam:   BP 130/80 (BP Location: Left Arm, Patient Position: Sitting, Cuff Size: Normal)   Pulse 89   Ht 5' 10 (1.778 m)   Wt 152 lb (68.9 kg)   SpO2 96%   BMI 21.81 kg/m   Salient findings:  CN II-XII intact Anterior rhinoscopy: Septum relatively midline; bilateral inferior turbinates with mild hypertrophy b/l; Nasal endoscopy was indicated to better evaluate the nose and paranasal sinuses, given the patient's history and exam findings, and is detailed below. No worrisome lesions of oral cavity/oropharynx No obviously palpable neck masses/lymphadenopathy/thyromegaly No respiratory distress or stridor  Seprately Identifiable Procedures:  Prior to initiating any procedures, risks/benefits/alternatives were explained to the patient and verbal consent obtained. PROCEDURE: Bilateral Diagnostic Rigid Nasal Endoscopy with Debridement Pre-procedure diagnosis: bilateral chronic sinusitis; history of Functional Endoscopic Sinus Surgery Post-procedure diagnosis: same Indication: See pre-procedure diagnosis and physical exam above Complications: None apparent EBL: 0 mL Anesthesia: Lidocaine  4% and topical decongestant was topically sprayed in each nasal cavity  Description of Procedure:  Patient was identified. A rigid 30 degree endoscope was utilized to evaluate the sinonasal cavities, mucosa, sinus ostia and turbinates and septum.  Overall, signs of mucosal inflammation are noted. Also noted are post-surgical changes with some crusting and thick mucoid secretions bilateral  middle meatus with modest middle turbinate lateralization. These were debrided with a 8 Fr suction. No adverse synechiae are noted. Right Middle meatus: mucoid secretions and crusting (worse than left), clear and patent after Right SE Recess: clear Left MM: thick mucoid secretions with some crusting in ethmoid cavity; cleared Left SE Recess: clear  CPT CODE -- 31237 - Mod 25, 50       Impression & Plans:  Charles [name] is a 53 y.o. male with h/o Inflammatory Bowel disease, Cutaneous Sarcoidosis, Chronic cough, and Asthma  1. Chronic pansinusitis   2. Hypertrophy of both inferior nasal turbinates   3. Nasal congestion   4. Post-nasal drip    He has done significantly better on a regular regimen of flonase, nasal rinses, and PO anthistamine. Some thick mucoid secretions but he has otherwise not  had an exacerbation needing antibiotics since last visit (short steroid course during URI) We had a long discussion about options today. I suspect given his medical problems, he may also have a stasis problem sinonasally.   At this point, given improvement, will continue with maximal medical management. If has exacerbations, can escalate to abx rinses based on culture or repeat CT and possible discussion for revision FESS (which he wishes to avoid)  - Continue flonase BID; if exacerbation, start astelin  BID - Continue nasal rinses; increase BID and add 0.5mg  budesonide  respule if exacerbation - Continue PO antihistamine F/u next spring, call sooner if any issues  See below regarding exact medications prescribed this encounter including dosages and route: No orders of the defined types were placed in this encounter.     Thank you for allowing me the opportunity to care for your patient. Please do not hesitate to contact me should you have any other questions.  Sincerely, Eldora Blanch, MD Otolaryngologist (ENT), [facility] Phone: [phone] Fax:  [phone]  04/12/2024, 4:46 PM    MDM:  Level 3: 99213 Complexity/Problems addressed: mod - chronic problems, stable Data complexity: low - Morbidity: low - Prescription Drug prescribed or managed: no

## 2024-04-13 ENCOUNTER — Ambulatory Visit (INDEPENDENT_AMBULATORY_CARE_PROVIDER_SITE_OTHER): Admitting: Family Medicine

## 2024-04-13 ENCOUNTER — Encounter: Payer: Self-pay | Admitting: Family Medicine

## 2024-04-13 VITALS — BP 118/78 | HR 104 | Temp 98.1°F | Ht 70.0 in | Wt 155.4 lb

## 2024-04-13 DIAGNOSIS — F411 Generalized anxiety disorder: Secondary | ICD-10-CM

## 2024-04-13 MED ORDER — DULOXETINE HCL 30 MG PO CPEP: 30.0000 mg | ORAL_CAPSULE | Freq: Every day | ORAL | 3 refills | Status: DC

## 2024-04-13 NOTE — Assessment & Plan Note (Signed)
 Had lengthy discussion with patient today regarding treatment for his anxiety.  This is worsened significantly in the last few months.  Has been under quite a bit of stress with his home life and work life as well.  We discussed treatment options.  He has previously been seeing a therapist and he may contact them about getting back in though is interested in starting medications today.  We did discuss potential treatment options.  Will start Cymbalta  30 mg daily for a week or 2 and then increase to 60 mg daily if tolerated.  He will follow-up with us  in a week or 2 via MyChart we can adjust the dose of medications as needed.

## 2024-04-13 NOTE — Patient Instructions (Signed)
 It was very nice to see you today!  VISIT SUMMARY: Today, we discussed your worsening anxiety and social isolation, as well as your chronic pain with neuropathic features. We have decided to start you on a new medication, Cymbalta , to help manage both your mood and pain symptoms.  YOUR PLAN: DEPRESSION AND ANXIETY: Your depression and anxiety have been worsening due to various personal stressors, including recent life changes and emotional challenges. -Start taking Cymbalta  30 mg daily for one week. If you tolerate it well, increase the dose to 60 mg daily. -Send a MyChart message in two weeks to report your progress. -You can take Cymbalta  either in the morning or evening, whichever is more convenient for you.  CHRONIC PAIN WITH NEUROPATHIC FEATURES: Your chronic pain with neuropathic features may be related to your autoimmune issues. -Cymbalta  may help alleviate your neuropathic pain by affecting serotonin and norepinephrine pathways. Follow the same dosing instructions as for your depression and anxiety.  Return if symptoms worsen or fail to improve.   Take care, Dr Kennyth  PLEASE NOTE:  If you had any lab tests, please let us  know if you have not heard back within a few days. You may see your results on mychart before we have a chance to review them but we will give you a call once they are reviewed by us .   If we ordered any referrals today, please let us  know if you have not heard from their office within the next week.   If you had any urgent prescriptions sent in today, please check with the pharmacy within an hour of our visit to make sure the prescription was transmitted appropriately.   Please try these tips to maintain a healthy lifestyle:  Eat at least 3 REAL meals and 1-2 snacks per day.  Aim for no more than 5 hours between eating.  If you eat breakfast, please do so within one hour of getting up.   Each meal should contain half fruits/vegetables, one quarter protein,  and one quarter carbs (no bigger than a computer mouse)  Cut down on sweet beverages. This includes juice, soda, and sweet tea.   Drink at least 1 glass of water with each meal and aim for at least 8 glasses per day  Exercise at least 150 minutes every week.

## 2024-04-13 NOTE — Progress Notes (Signed)
   Charles [name] is a 53 y.o. male who presents today for an office visit.  Assessment/Plan:   Chronic Problems Addressed Today: Anxiety state Had lengthy discussion with patient today regarding treatment for his anxiety.  This is worsened significantly in the last few months.  Has been under quite a bit of stress with his home life and work life as well.  We discussed treatment options.  He has previously been seeing a therapist and he may contact them about getting back in though is interested in starting medications today.  We did discuss potential treatment options.  Will start Cymbalta  30 mg daily for a week or 2 and then increase to 60 mg daily if tolerated.  He will follow-up with us  in a week or 2 via MyChart we can adjust the dose of medications as needed.     Subjective:  HPI:  See assessment / plan for status of chronic conditions.   Discussed the use of AI scribe software for clinical note transcription with the patient, who gave verbal consent to proceed.  History of Present Illness Charles [name] is a 53 year old male who presents with worsening anxiety and social isolation.  He has been experiencing worsening anxiety and feelings of social isolation over the past few months. He describes feeling 'flat' and has not been seeing his therapist for about a year. He recently ended a long-distance relationship, which has been emotionally challenging. He moved from a house to an apartment in July, which was stressful, and his dog became ill during this time. He also sent his son to college and another to boarding school, which added to his stress. He spends weekends without much social interaction, contributing to feelings of loneliness.  He has a history of neuropathy and autoimmune issues. He previously tried sertraline  but discontinued it after two weeks due to side effects, including an inability to ejaculate. He is wary of medications but is considering trying a new medication  to help manage his symptoms.  He recently experienced a significant illness, possibly COVID-19, which lasted about two weeks. He had severe congestion and crusted eyes but did not have a fever, likely due to consistent Tylenol  use. He managed to recover without antibiotics or steroids, which is unusual for him given his history of needing additional help to clear infections.  His current job requires him to work two to three nights a week, adding to his stress. He is also dealing with financial difficulties and the emotional impact of selling his house due to unemployment.         Objective:  Physical Exam: BP 118/78 (BP Location: Right Arm, Patient Position: Sitting, Cuff Size: Small)   Pulse (!) 104   Temp 98.1 F (36.7 C)   Ht 5' 10 (1.778 m)   Wt 155 lb 6.4 oz (70.5 kg)   SpO2 97%   BMI 22.30 kg/m   Gen: No acute distress, resting comfortably Neuro: Grossly normal, moves all extremities Psych: Normal affect and thought content      Charles Yount M. Kennyth, MD 04/13/2024 9:46 AM

## 2024-04-21 ENCOUNTER — Encounter: Payer: Self-pay | Admitting: Family Medicine

## 2024-04-21 NOTE — Telephone Encounter (Signed)
 See note

## 2024-04-22 NOTE — Telephone Encounter (Signed)
 I appreciate the update.  I am glad his mood is improving.  Those side effects that should hopefully improve over the next few weeks.  It is okay for him to increase to 60 mg daily.  Recommend he follow-up with us  again in a few weeks.

## 2024-05-17 ENCOUNTER — Other Ambulatory Visit (HOSPITAL_BASED_OUTPATIENT_CLINIC_OR_DEPARTMENT_OTHER): Payer: Self-pay

## 2024-05-17 ENCOUNTER — Ambulatory Visit (HOSPITAL_BASED_OUTPATIENT_CLINIC_OR_DEPARTMENT_OTHER): Admitting: Pulmonary Disease

## 2024-05-17 ENCOUNTER — Encounter (HOSPITAL_BASED_OUTPATIENT_CLINIC_OR_DEPARTMENT_OTHER): Payer: Self-pay | Admitting: Pulmonary Disease

## 2024-05-17 VITALS — BP 134/87 | HR 96 | Ht 70.0 in | Wt 159.4 lb

## 2024-05-17 DIAGNOSIS — R053 Chronic cough: Secondary | ICD-10-CM | POA: Diagnosis not present

## 2024-05-17 DIAGNOSIS — K6389 Other specified diseases of intestine: Secondary | ICD-10-CM

## 2024-05-17 DIAGNOSIS — R590 Localized enlarged lymph nodes: Secondary | ICD-10-CM

## 2024-05-17 DIAGNOSIS — D869 Sarcoidosis, unspecified: Secondary | ICD-10-CM | POA: Diagnosis not present

## 2024-05-17 DIAGNOSIS — Z9049 Acquired absence of other specified parts of digestive tract: Secondary | ICD-10-CM

## 2024-05-17 MED ORDER — BREZTRI AEROSPHERE 160-9-4.8 MCG/ACT IN AERO
2.0000 | INHALATION_SPRAY | Freq: Two times a day (BID) | RESPIRATORY_TRACT | 1 refills | Status: DC
  Filled 2024-05-17: qty 10.7, 30d supply, fill #0
  Filled 2024-07-05: qty 10.7, 30d supply, fill #1

## 2024-05-17 MED ORDER — OPTICHAMBER DIAMOND-LG MASK DEVI
0 refills | Status: AC
  Filled 2024-05-17: qty 1, 30d supply, fill #0

## 2024-05-17 MED ORDER — BREZTRI AEROSPHERE 160-9-4.8 MCG/ACT IN AERO: 2.0000 | INHALATION_SPRAY | Freq: Two times a day (BID) | RESPIRATORY_TRACT | 1 refills | Status: DC

## 2024-05-17 MED ORDER — OPTICHAMBER DIAMOND-LG MASK DEVI: 0 refills | Status: DC

## 2024-05-17 NOTE — Patient Instructions (Signed)
  Chronic cough - improved on prednisone  --HOLD Stiolto TWO puffs in the morning daily during fall/spring season --START Breztri TWO puffs in the morning and evening. Monitor for thrush

## 2024-05-17 NOTE — Progress Notes (Signed)
 Subjective:   PATIENT ID: Charles [name] GENDER: male DOB: 03-08-1971, MRN: [id number]  Chief Complaint  Patient presents with   Follow-up    Sarcoidosis- chronic cough continues   Reason for Visit: Follow-up sarcoidosis  Mr. Charles [name] is a 53 year old male never smoker with mild asthma, UC/IBS/pouchitis s/p colectomy in 2004, chronic polypoid sinusitis s/p sinus surgery 2005, spondyloarthropathy, hx of remicaide and humira  who presents for follow-up sarcoidosis.  Initial consult 04/25/23 Preceding his respiratory symptoms, he was diagnosed with idiopathic IBS after presented with abdominal cramping and diarrhea in 08/2002. He underwent colectomy in 02/2003 and requiring immunosuppressive therapy including prednisone . 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  In 2005 he developed severe coughing and seen by ENT due to unresponsiveness to conservative therapy. Did receive steroids which would suppress symptoms but would recur of medication so ended up being on prednisone  35-40 mg chronically. Underwent  bronchoscopy at [facility] in [place], [place] on 03/20/2004 for chronic cough with specimens from right mainstem and trachea described as nodular inflammatory lesions. Per surgical path report final diagnosis: Respiratory mucosa with squamous metaplasia and severe acute and chronic inflammation. [place] red special stain for amyloidosis: Negative. Path sent to [facility] considering that these findings could represent a manifestation of his underlying idiopathic inflammatory bowel disease. [facility] final read: Lung, right mainstem, bronchus and tracheo, bronchoscopic biopsies: Severe acute and chronic inflammation with squamous metaplasia consistent with idiopathic inflammatory bowel disease-related airway disease   Asthma was initially diagnosed in 1995 and  is usually triggered by pollen in the spring. Intermittently uses steroid inhaler (Advair 100, Qvar 40 tried and formoterol ) during this time. Developed  some recent post-nasal drainage. Had developed a hacking cough with thick clear sputum. Has been using Wixela with some improvement but developed thrush. No shortness of breath. Has been on prednisone  for 2-3 months. Started on prednisone  20 mg 1-2 weeks ago and tapered over this tim. Currently on 2.5 mg daily   He recently had a CT Cardiac scoring due to desire to work himself up since he turned 52. Was seen by Had CPET completed and normal. Fatigue has been years with thoughts this was related to his autoimmune.   He reports some sort of rash on left knee and periodically had skin rashes over the years.   Has been off Humira  June 2023. Previously on remicaide 2009-2014. And before this Enbrel in ~2007 for 1 year. Has not tried methotrexate.  Enjoys running 2-3 days a week.   Family member with sarcoid, maternal side Aunt with tracheobronchial amyloidosis  06/03/23 Since our last he has been weaned off prednisone  for 4-5 weeks. Occasional productive cough in the morning, worsening nasal congestion seems to affect. He presents for PET/CT scan review. At baseline no limitations in activity. Active runner.  10/30/23 Since our last visit PET/CT was low level so decision to hold on diagnostic testing and treatment. Has had persistent cough that mainly occurs in the morning. Has been traveling and reports sinus issues and requested ENT referral. Had pouchitis flare up and currently prednisone  taper since 10/08/23 improved sinus issues. Completed cipro  and flagyl . Has been compliant with Stiolto.  05/17/24 Since our last visit he is coughing that likely began when he had a viral illness associated with conjunctivitis in July/August. Though he may have covid. He took a prolonged course of steroids 20 x 5d, 15x5d, 10x5d and 5x5d. He has a recurrent cough off of steroids. Denies wheeze in lower lungs but has thick  sputum production. Compliant with Stiolto but not feeling like this is effective. Willing to  try inhaler with steroid. Was seen by ENT over the summer with last visit on 04/12/24. Complaint with flonase and daily rinses.  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   Drug-induced acute pancreatitis without infection or necrosis 02/28/2021   Dysplastic nevi    GERD (gastroesophageal reflux disease)    Herniated disc    History of proctitis 2006   IBD (inflammatory bowel disease)    Incisional hernia    Insomnia    Migraine headache    Morton's neuroma of right foot    Osteopenia    Pouchitis (HCC) 2006   Small bowel obstruction due to adhesions (HCC) 02/09/2015   Thyroid  disease    Vitamin D  deficiency      Family History  Problem Relation Age of Onset   Ulcerative colitis Mother    Graves' disease Mother    Colon cancer Paternal Grandmother    Brain cancer Paternal Grandmother        mets from colon   Esophageal cancer Neg Hx    Rectal cancer Neg Hx    Stomach cancer Neg Hx      Social History   Occupational History   Occupation: Nature conservation officer: [employer]  Tobacco Use   Smoking status: Never   Smokeless tobacco: Never  Vaping Use   Vaping status: Never Used  Substance and Sexual Activity   Alcohol use: Yes    Alcohol/week: 14.0 standard drinks of alcohol    Types: 14 Glasses of wine per week    Comment: 3-4 times/week   Drug use: No   Sexual activity: Yes    Allergies  Allergen Reactions   Azathioprine  Other (See Comments)    Serum sickness / acute hypersensitivity reaction with possible Acute pancreatitis   Mercaptopurine Nausea And Vomiting    Felt ill within 2 days of initiating therapy.     Outpatient  Medications Prior to Visit  Medication Sig Dispense Refill   albuterol  (VENTOLIN  HFA) 108 (90 Base) MCG/ACT inhaler Inhale 1 puff into the lungs every 6 (six) hours as needed for wheezing or shortness of breath. 8 g 2   Ascorbic Acid (VITAMIN C PO) Take 1 tablet by mouth daily.     azelastine  (ASTELIN ) 0.1 % nasal spray Place 2 sprays into both nostrils 2 (two) times daily. Use in each nostril as directed 30 mL 12   betamethasone dipropionate 0.05 % cream Apply topically.     celecoxib  (CELEBREX ) 100 MG capsule TAKE 1 CAPSULE BY MOUTH 2 TIMES DAILY AS NEEDED 60 capsule 5   cholecalciferol (VITAMIN D ) 1000 units tablet Take 2,000 Units by mouth daily.     Cyanocobalamin  (VITAMIN B-12 PO) Take 500 mcg by mouth daily.     DULoxetine  (CYMBALTA ) 30 MG capsule Take 1 capsule (30 mg total) by mouth daily. Take 1 tablet daily for a week then 2 tablets daily 180 capsule 3   rizatriptan  (MAXALT ) 5 MG tablet Take 1 tablet (5 mg total) by mouth as needed for migraine. May repeat in 2 hours  if needed 30 tablet 0   Tiotropium Bromide-Olodaterol (STIOLTO RESPIMAT ) 2.5-2.5 MCG/ACT AERS Inhale 2 puffs into the lungs daily. 4 g 11   Budesonide  90 MCG/ACT inhaler Inhale 1 puff into the lungs 2 (two) times daily. for wheezing or shortness of breath. (Patient not taking: Reported on 04/13/2024) 1 each 5   calcipotriene (DOVONOX) 0.005 % cream Apply topically. (Patient not taking: Reported on 05/17/2024)     diazepam  (VALIUM ) 5 MG tablet Take 1 tablet (5 mg total) by mouth at bedtime as needed for anxiety. (Patient not taking: Reported on 05/17/2024) 10 tablet 0   hydrocortisone  2.5 % cream Apply 1 Application topically 4 (four) times daily. (Patient not taking: Reported on 05/17/2024)     mesalamine  (CANASA ) 1000 MG suppository Place 1 suppository (1,000 mg total) rectally at bedtime. (Patient taking differently: Place 1,000 mg rectally as needed.) 30 suppository 1   montelukast  (SINGULAIR ) 10 MG tablet Take 1 tablet (10 mg  total) by mouth at bedtime. (Patient not taking: Reported on 04/13/2024) 30 tablet 3   rosuvastatin  (CRESTOR ) 5 MG tablet Take 1 tablet (5 mg total) by mouth daily. (Patient not taking: Reported on 05/17/2024) 90 tablet 3   Tiotropium Bromide-Olodaterol (STIOLTO RESPIMAT ) 2.5-2.5 MCG/ACT AERS Inhale 2 puffs into the lungs daily.     No facility-administered medications prior to visit.    Review of Systems  Constitutional:  Negative for chills, diaphoresis, fever, malaise/fatigue and weight loss.  HENT:  Negative for congestion.   Respiratory:  Positive for cough and sputum production. Negative for hemoptysis, shortness of breath and wheezing.   Cardiovascular:  Negative for chest pain, palpitations and leg swelling.     Objective:   Vitals:   05/17/24 1603  BP: 134/87  Pulse: 96  SpO2: 98%  Weight: 159 lb 6.4 oz (72.3 kg)  Height: 5' 10 (1.778 m)    SpO2: 98 %  Physical Exam: General: Well-appearing, no acute distress HENT: [place], AT Eyes: EOMI, no scleral icterus Respiratory: Clear to auscultation bilaterally.  No crackles, wheezing or rales Cardiovascular: RRR, -M/R/G, no JVD Extremities:-Edema,-tenderness Neuro: AAO x4, CNII-XII grossly intact Psych: Normal mood, normal affect  Data Reviewed:  Imaging: CT Chest 03/05/23 - mediastinal and hilar lymph nodes with perilymphatic pulmonary parenchyma nodularity PET/CT 05/26/23 - Nonspecific lowe level hypermetabolic activity in mediastinal and hilar lymph nodes. Spleen as well. Stable pulmonary sarcoid.  PFT: [facility] spirometry 06/18/04 FVC 6.39 (118%) FEV1 4.68 (106%) Ratio 73   Interpretation: Normal spirometry  CPET 10/14/22 Normal functional capacity  Bronchoscopy:   Pathology:   Labs:    Latest Ref Rng & Units 10/09/2023    1:32 PM 07/22/2023    9:12 AM 02/25/2023   10:04 AM  CBC  WBC 4.0 - 10.5 K/uL 11.1  6.0  7.7   Hemoglobin 13.0 - 17.0 g/dL 85.1  85.6  85.5   Hematocrit 39.0 - 52.0 % 43.1  42.9   42.9   Platelets 150.0 - 400.0 K/uL 340.0  252.0  285.0       Latest Ref Rng & Units 10/09/2023    1:32 PM 07/22/2023    9:12 AM 02/25/2023   10:04 AM  CMP  Glucose 70 - 99 mg/dL 873  86  78   BUN 6 - 23 mg/dL 14  11  11    Creatinine 0.40 - 1.50 mg/dL 8.92  9.11  9.02   Sodium 135 - 145 mEq/L 136  136  138   Potassium 3.5 - 5.1 mEq/L  4.0  3.9  4.6   Chloride 96 - 112 mEq/L 98  102  100   CO2 19 - 32 mEq/L 28  27  31    Calcium  8.4 - 10.5 mg/dL 9.6  9.4  89.9   Total Protein 6.0 - 8.3 g/dL 7.8  7.3  7.0   Total Bilirubin 0.2 - 1.2 mg/dL 1.2  1.7  1.5   Alkaline Phos 39 - 117 U/L 70  60  65   AST 0 - 37 U/L 12  22  27    ALT 0 - 53 U/L 8  15  18     Normal labs reviewed as above with mild t bili which has improved     Assessment & Plan:   Discussion: 53 year old male never smoker with mild asthma, UC/IBS/pouchitis s/p colectomy in 2004, chronic polypoid sinusitis s/p sinus surgery 2005, spondyloarthropathy, hx of remicaide and humira  who presents for follow-up sarcoidosis and asthma.  Pulmonary history preceded by idiopathic inflammatory bowel disease (failed 6 mercaptopurine and azathioprine ) treated primarily with high dose prednisone . Extensive work-up including evaluation with bronchoscopy with biopsies sent to [facility] suggestive of extraintestinal manifestations of IBS and follow-up with [facility] in 2005. Notes from October and November 2005 previously reviewed.  Currently sarcoid with low level presence on PET/CT and no indication to start immunosuppressants. Chronic cough persistent despite LAMA/LABA. Seems to only be responsive to steroids. ENT eval comments cough likely related to sinus stasis.    Probable sarcoid involving lung and skin and ?GI Mediastinal lymphadenopathy  --Reviewed PET/CT 05/2023. Mild inflammation in mediastinal nodes. --Hold on bronchoscopy --No indication from pulmonary standpoint to start immunosuppressants at this time. --ENT following. May  benefit from CT sinus +/- sinus biopsy if indicated to rule out sarcoid involvement --If we needed to consider biologics in the future, may need to trial cellcept or increased dosing of Humira  weekly. Will discuss options with [facility] at that time  Chronic cough - improved on prednisone  --HOLD Stiolto TWO puffs in the morning daily during fall/spring season --START Breztri TWO puffs in the morning and evening. Monitor for thrush   Health Maintenance Immunization History  Administered Date(s) Administered   Hepatitis A 08/24/2005, 02/04/2006   Hepatitis B 01/11/1992, 02/17/1992, 07/13/1992   Influenza Split 05/26/2012   Influenza Whole 05/13/2008, 04/30/2010, 04/30/2011   Influenza, Seasonal, Injecte, Preservative Fre 06/02/2023   Influenza,inj,Quad PF,6+ Mos 06/08/2013, 06/02/2014, 05/24/2015, 05/07/2016, 04/30/2017, 05/29/2018, 06/15/2019, 07/21/2020, 06/19/2021, 07/22/2022   MMR 10/19/1971, 03/21/1978   PFIZER(Purple Top)SARS-COV-2 Vaccination 10/28/2019, 11/18/2019, 05/08/2020   PPD Test 04/30/2011, 05/26/2012, 06/08/2013, 05/30/2014, 05/24/2015, 05/07/2016, 04/30/2017   Pneumococcal Polysaccharide-23 04/30/2010   Td 06/19/2006   Tdap 04/30/2011, 07/21/2020   Zoster Recombinant(Shingrix ) 06/02/2023, 08/07/2023   CT Lung Screen - never smoker, not qualified  No orders of the defined types were placed in this encounter.  Meds ordered this encounter  Medications   DISCONTD: budesonide -glycopyrrolate-formoterol  (BREZTRI AEROSPHERE) 160-9-4.8 MCG/ACT AERO inhaler    Sig: Inhale 2 puffs into the lungs in the morning and at bedtime.    Dispense:  10.7 g    Refill:  1   DISCONTD: Spacer/Aero-Holding Chambers (OPTICHAMBER DIAMOND-LG MASK) DEVI    Sig: Use with inhaler    Dispense:  1 each    Refill:  0   budesonide -glycopyrrolate-formoterol  (BREZTRI AEROSPHERE) 160-9-4.8 MCG/ACT AERO inhaler    Sig: Inhale 2 puffs into the lungs in the morning and at bedtime.    Dispense:  10.7 g     Refill:  1   Spacer/Aero-Holding Chambers (OPTICHAMBER DIAMOND-LG MASK) DEVI    Sig: Use as directed with inhaler.    Dispense:  1 each    Refill:  0    Return in about 3 months (around 08/16/2024).  I have spent a total time of 32-minutes on the day of the appointment including chart review, data review, collecting history, coordinating care and discussing medical diagnosis and plan with the patient/family. Past medical history, allergies, medications were reviewed. Pertinent imaging, labs and tests included in this note have been reviewed and interpreted independently by me.  Dominick Zertuche Slater Staff, MD [facility] 05/17/2024 4:39 PM

## 2024-05-24 ENCOUNTER — Ambulatory Visit (INDEPENDENT_AMBULATORY_CARE_PROVIDER_SITE_OTHER): Admitting: Family Medicine

## 2024-05-24 VITALS — BP 120/80 | HR 97 | Temp 97.7°F | Ht 70.0 in | Wt 156.4 lb

## 2024-05-24 DIAGNOSIS — Z111 Encounter for screening for respiratory tuberculosis: Secondary | ICD-10-CM

## 2024-05-24 DIAGNOSIS — Z23 Encounter for immunization: Secondary | ICD-10-CM | POA: Diagnosis not present

## 2024-05-24 DIAGNOSIS — F411 Generalized anxiety disorder: Secondary | ICD-10-CM

## 2024-05-24 LAB — CBC
HCT: 43.4 % (ref 39.0–52.0)
Hemoglobin: 14.9 g/dL (ref 13.0–17.0)
MCHC: 34.2 g/dL (ref 30.0–36.0)
MCV: 94.5 fl (ref 78.0–100.0)
Platelets: 275 K/uL (ref 150.0–400.0)
RBC: 4.6 Mil/uL (ref 4.22–5.81)
RDW: 13 % (ref 11.5–15.5)
WBC: 7.3 K/uL (ref 4.0–10.5)

## 2024-05-24 LAB — TSH: TSH: 2.09 u[IU]/mL (ref 0.35–5.50)

## 2024-05-24 MED ORDER — ESCITALOPRAM OXALATE 10 MG PO TABS: 10.0000 mg | ORAL_TABLET | Freq: Every day | ORAL | 3 refills | Status: DC

## 2024-05-24 NOTE — Progress Notes (Signed)
 Charles [name] is a 53 y.o. male who presents today for an office visit.  Assessment/Plan:  New/Acute Problems: Elevated heart rate No red flags.  Overall reassuring exam.  He is experiencing higher heart rates than typical during exercise and at rest.  He is concerned this is maybe due to a side effect of Cymbalta .  Will be decreasing to 30 mg daily as below.  He will follow-up with us  in a few weeks.  Check labs today.  Would consider Holter monitor if this continues to be an issue despite above.  We discussed reasons to return to care.  Chronic Problems Addressed Today: Anxiety state Overall his anxiety symptoms are much better controlled on Cymbalta  though he is concerned about potential side effects including increased sweating and elevated heart rate.  We discussed potential treatment options including cross titrating with an SSRI or maintaining a lower dose of Cymbalta  for a few weeks.  Given that he has had good success with Cymbalta  for managing this anxiety symptoms would be reasonable for us  to continue with 30 mg daily for a couple of weeks.  He will follow-up with us  in a few weeks via MyChart.  If he is still having ongoing issues with side effects at that point would consider cross titrating with Lexapro.  Flu shot given today.   Subjective:  HPI:  See assessment / plan for status of chronic conditions.    Discussed the use of AI scribe software for clinical note transcription with the patient, who gave verbal consent to proceed.  History of Present Illness Charles [name] is a 53 year old male who presents with palpitations and elevated heart rate during exercise, possibly related to Cymbalta  use.  He has been experiencing palpitations and an elevated heart rate during exercise since starting Cymbalta  six weeks ago. Initially, he was on 30 mg for the first week, then continued on 30 mg. During exercise, his heart rate rises to 150-180 bpm, which is higher than expected  given his fitness level. He feels disheartened as he is unable to maintain his usual exercise routine, including training for a ten-mile race. He also notes a resting heart rate in the low 90s and feeling jittery in the mornings.  He experiences side effects from Cymbalta , including excessive sweating, particularly at night, and occasional fatigue. Despite these side effects, he feels generally positive about the medication's impact on his mood and nerve pain from a previous back disc issue.  He has a history of transient hyperthyroidism, diagnosed as Graves' disease, which resolved without treatment. His mother has Hashimoto's disease, and he has a history of colitis. He is concerned about the possibility of his current symptoms being related to thyroid  issues.  He is currently taking Cymbalta  and has previously tried Zoloft , which caused initial side effects that resolved over time. He is cautious about medication changes due to past experiences with side effects.         Objective:  Physical Exam: BP 120/80   Pulse 97   Temp 97.7 F (36.5 C) (Temporal)   Ht 5' 10 (1.778 m)   Wt 156 lb 6.4 oz (70.9 kg)   SpO2 98%   BMI 22.44 kg/m   Gen: No acute distress, resting comfortably CV: Regular rate and rhythm with no murmurs appreciated Pulm: Normal work of breathing, clear to auscultation bilaterally with no crackles, wheezes, or rhonchi Neuro: Grossly normal, moves all extremities Psych: Normal affect and thought content      [address]  EMERSON Kitty, MD 05/24/2024 12:16 PM

## 2024-05-24 NOTE — Patient Instructions (Signed)
 It was very nice to see you today!  VISIT SUMMARY: Today, we discussed your palpitations and elevated heart rate during exercise, which may be related to your current medication, Cymbalta . We also reviewed your history of hyperthyroidism and planned for further testing. Additionally, we addressed your generalized anxiety disorder and updated your immunizations.  YOUR PLAN: GENERALIZED ANXIETY DISORDER: Your mood and nerve pain have improved with Cymbalta , but you are experiencing side effects like sweating, elevated heart rate, and jitteriness. -Continue taking Cymbalta  30 mg for another 1-2 weeks to see if the side effects improve. -A prescription for Lexapro 10 mg has been sent to the pharmacy in case a switch is needed. -Monitor your symptoms and communicate your progress in 2 weeks.  PALPITATIONS AND ELEVATED HEART RATE: Your palpitations and elevated heart rate during exercise are likely side effects of Cymbalta , but thyroid  issues will also be checked. -Monitor your heart rate and symptoms over the next 1-2 weeks. -Thyroid  function tests will be conducted to rule out thyroid  issues.  TRANSIENT HYPERTHYROIDISM (GRAVES' DISEASE): You have a history of transient hyperthyroidism with symptoms similar to your current presentation. -Thyroid  function tests will be ordered to check your thyroid  levels.  IMMUNIZATION: You are due for a flu shot. -A flu shot will be administered.  Return if symptoms worsen or fail to improve.   Take care, Dr Kennyth  PLEASE NOTE:  If you had any lab tests, please let us  know if you have not heard back within a few days. You may see your results on mychart before we have a chance to review them but we will give you a call once they are reviewed by us .   If we ordered any referrals today, please let us  know if you have not heard from their office within the next week.   If you had any urgent prescriptions sent in today, please check with the pharmacy within  an hour of our visit to make sure the prescription was transmitted appropriately.   Please try these tips to maintain a healthy lifestyle:  Eat at least 3 REAL meals and 1-2 snacks per day.  Aim for no more than 5 hours between eating.  If you eat breakfast, please do so within one hour of getting up.   Each meal should contain half fruits/vegetables, one quarter protein, and one quarter carbs (no bigger than a computer mouse)  Cut down on sweet beverages. This includes juice, soda, and sweet tea.   Drink at least 1 glass of water with each meal and aim for at least 8 glasses per day  Exercise at least 150 minutes every week.

## 2024-05-24 NOTE — Assessment & Plan Note (Signed)
 Overall his anxiety symptoms are much better controlled on Cymbalta  though he is concerned about potential side effects including increased sweating and elevated heart rate.  We discussed potential treatment options including cross titrating with an SSRI or maintaining a lower dose of Cymbalta  for a few weeks.  Given that he has had good success with Cymbalta  for managing this anxiety symptoms would be reasonable for us  to continue with 30 mg daily for a couple of weeks.  He will follow-up with us  in a few weeks via MyChart.  If he is still having ongoing issues with side effects at that point would consider cross titrating with Lexapro.

## 2024-05-25 ENCOUNTER — Encounter (HOSPITAL_BASED_OUTPATIENT_CLINIC_OR_DEPARTMENT_OTHER): Payer: Self-pay | Admitting: Pulmonary Disease

## 2024-05-25 LAB — COMPREHENSIVE METABOLIC PANEL WITH GFR
ALT: 18 U/L (ref 0–53)
AST: 27 U/L (ref 0–37)
Albumin: 4.5 g/dL (ref 3.5–5.2)
Alkaline Phosphatase: 65 U/L (ref 39–117)
BUN: 14 mg/dL (ref 6–23)
CO2: 26 meq/L (ref 19–32)
Calcium: 10.1 mg/dL (ref 8.4–10.5)
Chloride: 99 meq/L (ref 96–112)
Creatinine, Ser: 0.89 mg/dL (ref 0.40–1.50)
GFR: 99.16 mL/min (ref >60.00)
Glucose, Bld: 111 mg/dL — ABNORMAL HIGH (ref 70–99)
Potassium: 4.1 meq/L (ref 3.5–5.1)
Sodium: 138 meq/L (ref 135–145)
Total Bilirubin: 1.8 mg/dL — ABNORMAL HIGH (ref 0.2–1.2)
Total Protein: 7.8 g/dL (ref 6.0–8.3)

## 2024-05-26 ENCOUNTER — Encounter: Payer: Self-pay | Admitting: Family Medicine

## 2024-05-26 ENCOUNTER — Ambulatory Visit: Payer: Self-pay | Admitting: Family Medicine

## 2024-05-26 LAB — QUANTIFERON-TB GOLD PLUS
Mitogen-NIL: >10 [IU]/mL
NIL: 0.02 [IU]/mL
QuantiFERON-TB Gold Plus: NEGATIVE
TB1-NIL: 0 [IU]/mL
TB2-NIL: 0 [IU]/mL

## 2024-05-26 NOTE — Progress Notes (Signed)
 Labs are all stable. TB test is negative. I would like him to follow-up with us  in a few weeks on MyChart as we discussed at his office visit here.

## 2024-06-28 ENCOUNTER — Encounter: Payer: Self-pay | Admitting: Family Medicine

## 2024-06-28 NOTE — Telephone Encounter (Signed)
 We can cross titrate with Lexapro.  Recommend 2 weeks of 20 mg Cymbalta  and 5 mg Lexapro THEN 10 mg of Lexapro.  Please send in new prescriptions if needed.  He can also schedule an appointment to discuss further if he wishes.

## 2024-06-28 NOTE — Telephone Encounter (Signed)
 See note

## 2024-06-29 ENCOUNTER — Other Ambulatory Visit: Payer: Self-pay | Admitting: *Deleted

## 2024-06-29 MED ORDER — DULOXETINE HCL 20 MG PO CPEP: 20.0000 mg | ORAL_CAPSULE | Freq: Every day | ORAL | 0 refills | Status: DC

## 2024-06-30 ENCOUNTER — Encounter: Payer: Self-pay | Admitting: Internal Medicine

## 2024-07-02 ENCOUNTER — Telehealth: Payer: Self-pay | Admitting: Internal Medicine

## 2024-07-02 NOTE — Telephone Encounter (Signed)
 Inbound call from patient requesting f/u call in regards to scheduling for sooner apt with provider.   Offered patient next available with PA patient prefers provider. Please advise.   Thank you

## 2024-07-02 NOTE — Telephone Encounter (Signed)
 Spoke with the patient. Agrees to come see Dr Avram 08/05/24.

## 2024-07-26 ENCOUNTER — Encounter: Payer: 59 | Admitting: Family Medicine

## 2024-08-02 ENCOUNTER — Ambulatory Visit (HOSPITAL_BASED_OUTPATIENT_CLINIC_OR_DEPARTMENT_OTHER): Admitting: Pulmonary Disease

## 2024-08-02 ENCOUNTER — Other Ambulatory Visit (HOSPITAL_BASED_OUTPATIENT_CLINIC_OR_DEPARTMENT_OTHER): Payer: Self-pay

## 2024-08-02 ENCOUNTER — Encounter (HOSPITAL_BASED_OUTPATIENT_CLINIC_OR_DEPARTMENT_OTHER): Payer: Self-pay | Admitting: Pulmonary Disease

## 2024-08-02 VITALS — BP 134/89 | HR 100 | Ht 70.0 in | Wt 162.3 lb

## 2024-08-02 DIAGNOSIS — D869 Sarcoidosis, unspecified: Secondary | ICD-10-CM | POA: Diagnosis not present

## 2024-08-02 DIAGNOSIS — R053 Chronic cough: Secondary | ICD-10-CM

## 2024-08-02 MED ORDER — BREZTRI AEROSPHERE 160-9-4.8 MCG/ACT IN AERO
2.0000 | INHALATION_SPRAY | Freq: Two times a day (BID) | RESPIRATORY_TRACT | 5 refills | Status: AC
  Filled 2024-08-02: qty 10.7, 30d supply, fill #0

## 2024-08-02 NOTE — Assessment & Plan Note (Addendum)
 Improved on prednisone . Worsened during fall/spring season. Intermittently improved on Breztri  however some non-adherence --Previously Stiolto  --CONTINUE Breztri  TWO puffs as needed in the morning and evening. Monitor for thrush --CONTINUE nasal sprays --START pantoprazole  20 mg daily. Discuss with GI regarding optimal dosing

## 2024-08-02 NOTE — Patient Instructions (Addendum)
 Chronic cough --CONTINUE Breztri  TWO puffs as needed in the morning and evening. Monitor for thrush --CONTINUE nasal sprays --START pantoprazole  20 mg daily. Discuss with GI regarding optimal dosing

## 2024-08-02 NOTE — Progress Notes (Signed)
 Subjective:   PATIENT ID: Charles [name] GENDER: male DOB: 1971-05-29, MRN: [id number]  Chief Complaint  Patient presents with   Sarcoidosis    Reason for Visit: Follow-up sarcoid     Charles [name] is a 53 y.o. male never smoker with mild asthma, UC/IBS/pouchitis s/p colectomy in 2004, chronic polypoid sinusitis s/p sinus surgery 2005, spondyloarthropathy, hx of remicaide and humira  who presents for follow-up sarcoidosis.  Initial consult 04/25/23 Preceding his respiratory symptoms, he was diagnosed with idiopathic IBS after presented with abdominal cramping and diarrhea in 08/2002. He underwent colectomy in 02/2003 and requiring immunosuppressive therapy including prednisone . 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   In 2005 he developed severe coughing and seen by ENT due to unresponsiveness to conservative therapy. Did receive steroids which would suppress symptoms but would recur of medication so ended up being on prednisone  35-40 mg chronically. Underwent  bronchoscopy at [facility] in [place], [place] on 03/20/2004 for chronic cough with specimens from right mainstem and trachea described as nodular inflammatory lesions. Per surgical path report final diagnosis: Respiratory mucosa with squamous metaplasia and severe acute and chronic inflammation. [place] red special stain for amyloidosis: Negative. Path sent to [facility] considering that these findings could represent a manifestation of his underlying idiopathic inflammatory bowel disease. [facility] final read: Lung, right mainstem, bronchus and tracheo, bronchoscopic biopsies: Severe acute and chronic inflammation with squamous metaplasia consistent with idiopathic inflammatory bowel disease-related airway disease    Asthma was initially diagnosed in 1995 and  is usually triggered by pollen in the spring. Intermittently uses steroid inhaler (Advair 100, Qvar 40 tried and formoterol ) during this time. Developed some recent post-nasal drainage. Had  developed a hacking cough with thick clear sputum. Has been using Wixela with some improvement but developed thrush. No shortness of breath. Has been on prednisone  for 2-3 months. Started on prednisone  20 mg 1-2 weeks ago and tapered over this tim. Currently on 2.5 mg daily    He recently had a CT Cardiac scoring due to desire to work himself up since he turned 52. Was seen by Had CPET completed and normal. Fatigue has been years with thoughts this was related to his autoimmune.    He reports some sort of rash on left knee and periodically had skin rashes over the years.    Has been off Humira  June 2023. Previously on remicaide 2009-2014. And before this Enbrel in ~2007 for 1 year. Has not tried methotrexate.   Enjoys running 2-3 days a week.    Family member with sarcoid, maternal side Aunt with tracheobronchial amyloidosis  06/03/23 Since our last he has been weaned off prednisone  for 4-5 weeks. Occasional productive cough in the morning, worsening nasal congestion seems to affect. He presents for PET/CT scan review. At baseline no limitations in activity. Active runner.   10/30/23 Since our last visit PET/CT was low level so decision to hold on diagnostic testing and treatment. Has had persistent cough that mainly occurs in the morning. Has been traveling and reports sinus issues and requested ENT referral. Had pouchitis flare up and currently prednisone  taper since 10/08/23 improved sinus issues. Completed cipro  and flagyl . Has been compliant with Stiolto.   05/17/24 Since our last visit he is coughing that likely began when he had a viral illness associated with conjunctivitis in July/August. Though he may have covid. He took a prolonged course of steroids 20 x 5d, 15x5d, 10x5d and 5x5d. He has a recurrent cough off of steroids. Denies wheeze in lower  lungs but has thick sputum production. Compliant with Stiolto but not feeling like this is effective. Willing to try inhaler with steroid. Was  seen by ENT over the summer with last visit on 04/12/24. Complaint with flonase and daily rinses.  Social History: Runs with Rockey Kilts and Newell Goltz      08/02/2024 Discussed the use of AI scribe software for clinical note transcription with the patient, who gave verbal consent to proceed.  History of Present Illness Since our last visit he has been on Breztri  with initial improvement. Due to travel, he reports that using it about half the time and reports episode of some tingliness in his mouth that improves after using a swish of nystatin for a few days. Has been using spacer with his inhaler. Continues to have productive cough in the morning after drinking hot coffee. Albuterol  will help. Continues to have nasal congestion that he feels is contributing. Has had recent episodes of bowel obstruction for which he is seeing GI.     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   Drug-induced acute pancreatitis without infection or necrosis 02/28/2021   Dysplastic nevi    GERD (gastroesophageal reflux disease)    Herniated disc    History of proctitis 2006   IBD (inflammatory bowel disease)    Incisional hernia    Insomnia    Migraine headache    Morton's neuroma of right foot    Osteopenia    Pouchitis (HCC) 2006   Small bowel obstruction due to adhesions (HCC) 02/09/2015   Thyroid  disease    Vitamin D  deficiency      Family History  Problem Relation Age of Onset   Ulcerative colitis Mother    Graves' disease Mother    Colon cancer Paternal Grandmother    Brain cancer Paternal Grandmother        mets from colon   Esophageal cancer Neg Hx    Rectal cancer Neg Hx    Stomach cancer Neg Hx      Social History    Occupational History   Occupation: Nature Conservation Officer: [employer]  Tobacco Use   Smoking status: Never   Smokeless tobacco: Never  Vaping Use   Vaping status: Never Used  Substance and Sexual Activity   Alcohol use: Yes    Alcohol/week: 14.0 standard drinks of alcohol    Types: 14 Glasses of wine per week    Comment: 3-4 times/week   Drug use: No   Sexual activity: Yes    Allergies[1]   Outpatient Medications Prior to Visit  Medication Sig Dispense Refill   albuterol  (VENTOLIN  HFA) 108 (90 Base) MCG/ACT inhaler Inhale 1 puff into the lungs every 6 (six) hours as needed for wheezing or shortness of breath. 8 g 2   Ascorbic Acid (VITAMIN C PO) Take 1 tablet by mouth daily.     azelastine  (ASTELIN ) 0.1 % nasal spray Place 2 sprays into both nostrils 2 (two) times daily. Use in each nostril as directed 30 mL 12   betamethasone dipropionate 0.05 % cream Apply topically.     calcipotriene (DOVONOX) 0.005 % cream Apply topically.     celecoxib  (CELEBREX ) 100  MG capsule TAKE 1 CAPSULE BY MOUTH 2 TIMES DAILY AS NEEDED 60 capsule 5   cholecalciferol (VITAMIN D ) 1000 units tablet Take 2,000 Units by mouth daily.     Cyanocobalamin  (VITAMIN B-12 PO) Take 500 mcg by mouth daily.     escitalopram  (LEXAPRO ) 10 MG tablet Take 1 tablet (10 mg total) by mouth daily. 90 tablet 3   hydrocortisone  2.5 % cream Apply 1 Application topically 4 (four) times daily.     montelukast  (SINGULAIR ) 10 MG tablet Take 1 tablet (10 mg total) by mouth at bedtime. 30 tablet 3   rizatriptan  (MAXALT ) 5 MG tablet Take 1 tablet (5 mg total) by mouth as needed for migraine. May repeat in 2 hours if needed 30 tablet 0   rosuvastatin  (CRESTOR ) 5 MG tablet Take 1 tablet (5 mg total) by mouth daily. 90 tablet 3   Spacer/Aero-Holding Chambers (OPTICHAMBER DIAMOND -LG MASK) DEVI Use as directed with inhaler. 1 each 0   budesonide -glycopyrrolate -formoterol  (BREZTRI  AEROSPHERE) 160-9-4.8 MCG/ACT AERO inhaler  Inhale 2 puffs into the lungs in the morning and at bedtime. 10.7 g 1   diazepam  (VALIUM ) 5 MG tablet Take 1 tablet (5 mg total) by mouth at bedtime as needed for anxiety. 10 tablet 0   DULoxetine  (CYMBALTA ) 20 MG capsule Take 1 capsule (20 mg total) by mouth daily. 14 capsule 0   mesalamine  (CANASA ) 1000 MG suppository Place 1 suppository (1,000 mg total) rectally at bedtime. (Patient taking differently: Place 1,000 mg rectally as needed.) 30 suppository 1   No facility-administered medications prior to visit.    Review of Systems  Constitutional:  Negative for chills, diaphoresis, fever, malaise/fatigue and weight loss.  HENT:  Negative for congestion.   Respiratory:  Positive for sputum production. Negative for cough, hemoptysis, shortness of breath and wheezing.   Cardiovascular:  Negative for chest pain, palpitations and leg swelling.     Objective:   Vitals:   08/02/24 0911  BP: 134/89  Pulse: 100  SpO2: 96%  Weight: 162 lb 4.8 oz (73.6 kg)  Height: 5' 10 (1.778 m)   SpO2: 96 %  Physical Exam: General: Well-appearing, no acute distress HENT: [place], AT Eyes: EOMI, no scleral icterus Respiratory: Clear to auscultation bilaterally.  No crackles, wheezing or rales Cardiovascular: RRR, -M/R/G, no JVD Extremities:-Edema,-tenderness Neuro: AAO x4, CNII-XII grossly intact Psych: Normal mood, normal affect  Data Reviewed:  Imaging: CT Chest 03/05/23 - mediastinal and hilar lymph nodes with perilymphatic pulmonary parenchyma nodularity PET/CT 05/26/23 - Nonspecific lowe level hypermetabolic activity in mediastinal and hilar lymph nodes. Spleen as well. Stable pulmonary sarcoid.  PFT: [facility] spirometry 06/18/04 FVC 6.39 (118%) FEV1 4.68 (106%) Ratio 73   Interpretation: Normal spirometry  Bronchoscopy:    Pathology:    Labs:    Latest Ref Rng & Units 05/24/2024   12:22 PM 10/09/2023    1:32 PM 07/22/2023    9:12 AM  CBC  WBC 4.0 - 10.5 K/uL 7.3  11.1  6.0    Hemoglobin 13.0 - 17.0 g/dL 85.0  85.1  85.6   Hematocrit 39.0 - 52.0 % 43.4  43.1  42.9   Platelets 150.0 - 400.0 K/uL 275.0  340.0  252.0       Latest Ref Rng & Units 05/24/2024   12:22 PM 10/09/2023    1:32 PM 07/22/2023    9:12 AM  CMP  Glucose 70 - 99 mg/dL 888  873  86   BUN 6 - 23 mg/dL 14  14  11   Creatinine 0.40 - 1.50 mg/dL 9.10  8.92  9.11   Sodium 135 - 145 mEq/L 138  136  136   Potassium 3.5 - 5.1 mEq/L 4.1  4.0  3.9   Chloride 96 - 112 mEq/L 99  98  102   CO2 19 - 32 mEq/L 26  28  27    Calcium  8.4 - 10.5 mg/dL 89.8  9.6  9.4   Total Protein 6.0 - 8.3 g/dL 7.8  7.8  7.3   Total Bilirubin 0.2 - 1.2 mg/dL 1.8  1.2  1.7   Alkaline Phos 39 - 117 U/L 65  70  60   AST 0 - 37 U/L 27  12  22    ALT 0 - 53 U/L 18  8  15     Normal blood counts and mildly elevated T bili, may be chronic     Assessment & Plan:   Discussion: 53 year old male never smoker with mild asthma, UC/IBS/pouchitis s/p colectomy in 2004, chronic polypoid sinusitis s/p sinus surgery 2005, spondyloarthropathy, hx of remicaide and humira  who presents for follow-up sarcoidosis and asthma.   Pulmonary history preceded by idiopathic inflammatory bowel disease (failed 6 mercaptopurine and azathioprine ) treated primarily with high dose prednisone . Extensive work-up including evaluation with bronchoscopy with biopsies sent to [facility] suggestive of extraintestinal manifestations of IBS and follow-up with [facility] in 2005. Notes from October and November 2005 previously reviewed.   Currently sarcoid with low level presence on PET/CT and no indication to start immunosuppressants. Chronic cough persistent despite LAMA/LABA. Seems to only be responsive to steroids. ENT eval comments cough likely related to sinus stasis.      Assessment & Plan Sarcoidosis Probable sarcoid involving lung and skin and ?GI Mediastinal lymphadenopathy  --Reviewed PET/CT 05/2023. Mild inflammation in mediastinal nodes. Consider CT  for surveillance next year --Hold on bronchoscopy --No indication from pulmonary standpoint to start immunosuppressants at this time. --ENT following. May benefit from CT sinus +/- sinus biopsy if indicated to rule out sarcoid involvement --If we needed to consider biologics in the future, may need to trial cellcept or increased dosing of Humira  weekly. Will discuss options with Avram at that time Chronic cough  Improved on prednisone . Worsened during fall/spring season. Intermittently improved on Breztri  however some non-adherence --Previously Stiolto  --CONTINUE Breztri  TWO puffs as needed in the morning and evening. Monitor for thrush --CONTINUE nasal sprays --START pantoprazole  20 mg daily. Discuss with GI regarding optimal dosing  Health Maintenance Immunization History  Administered Date(s) Administered   Hepatitis A 08/24/2005, 02/04/2006   Hepatitis B 01/11/1992, 02/17/1992, 07/13/1992   Influenza Split 05/26/2012   Influenza Whole 05/13/2008, 04/30/2010, 04/30/2011   Influenza, Seasonal, Injecte, Preservative Fre 06/02/2023, 05/24/2024   Influenza,inj,Quad PF,6+ Mos 06/08/2013, 06/02/2014, 05/24/2015, 05/07/2016, 04/30/2017, 05/29/2018, 06/15/2019, 07/21/2020, 06/19/2021, 07/22/2022   MMR 10/19/1973, 03/21/1978   PFIZER(Purple Top)SARS-COV-2 Vaccination 10/28/2019, 11/18/2019, 05/08/2020   PPD Test 04/30/2011, 05/26/2012, 06/08/2013, 05/30/2014, 05/24/2015, 05/07/2016, 04/30/2017   Pneumococcal Conjugate,unspecified 04/30/2010   Pneumococcal Polysaccharide-23 04/30/2010   Td 06/19/2006   Tdap 04/30/2011, 07/21/2020   Zoster Recombinant(Shingrix ) 06/02/2023, 08/07/2023   CT Lung Screen-never smoker, not qualified   No orders of the defined types were placed in this encounter.  Meds ordered this encounter  Medications   budesonide -glycopyrrolate -formoterol  (BREZTRI  AEROSPHERE) 160-9-4.8 MCG/ACT AERO inhaler    Sig: Inhale 2 puffs into the lungs in the morning and at  bedtime.    Dispense:  10.7 g    Refill:  5  Return in about 6 months (around 01/31/2025).  I have spent a total time of 30-minutes on the day of the appointment reviewing prior documentation, coordinating care and discussing medical diagnosis and plan with the patient/family. Imaging, labs and tests included in this note have been reviewed and interpreted independently by me. This note is generated using Abridge programming. Patient/family has given consent.  Smiley Birr Slater Staff, MD [facility] 08/02/2024 10:57 AM        [1]  Allergies Allergen Reactions   Azathioprine  Other (See Comments)    Serum sickness / acute hypersensitivity reaction with possible Acute pancreatitis   Mercaptopurine Nausea And Vomiting    Felt ill within 2 days of initiating therapy.

## 2024-08-02 NOTE — Assessment & Plan Note (Addendum)
 Probable sarcoid involving lung and skin and ?GI Mediastinal lymphadenopathy  --Reviewed PET/CT 05/2023. Mild inflammation in mediastinal nodes. Consider CT for surveillance next year --Hold on bronchoscopy --No indication from pulmonary standpoint to start immunosuppressants at this time. --ENT following. May benefit from CT sinus +/- sinus biopsy if indicated to rule out sarcoid involvement --If we needed to consider biologics in the future, may need to trial cellcept or increased dosing of Humira  weekly. Will discuss options with Avram at that time

## 2024-08-03 ENCOUNTER — Encounter: Payer: Self-pay | Admitting: Family Medicine

## 2024-08-05 ENCOUNTER — Telehealth: Payer: Self-pay | Admitting: Internal Medicine

## 2024-08-05 ENCOUNTER — Other Ambulatory Visit: Payer: Self-pay | Admitting: Internal Medicine

## 2024-08-05 ENCOUNTER — Encounter: Payer: Self-pay | Admitting: Internal Medicine

## 2024-08-05 ENCOUNTER — Other Ambulatory Visit (HOSPITAL_COMMUNITY): Payer: Self-pay

## 2024-08-05 ENCOUNTER — Other Ambulatory Visit (HOSPITAL_COMMUNITY): Payer: Self-pay | Admitting: Internal Medicine

## 2024-08-05 ENCOUNTER — Ambulatory Visit: Admitting: Internal Medicine

## 2024-08-05 VITALS — BP 110/70 | HR 97 | Ht 70.0 in | Wt 161.0 lb

## 2024-08-05 DIAGNOSIS — K56609 Unspecified intestinal obstruction, unspecified as to partial versus complete obstruction: Secondary | ICD-10-CM

## 2024-08-05 DIAGNOSIS — K50118 Crohn's disease of large intestine with other complication: Secondary | ICD-10-CM | POA: Diagnosis not present

## 2024-08-05 DIAGNOSIS — K91850 Pouchitis: Secondary | ICD-10-CM

## 2024-08-05 DIAGNOSIS — Z1159 Encounter for screening for other viral diseases: Secondary | ICD-10-CM

## 2024-08-05 DIAGNOSIS — Z7960 Long term (current) use of unspecified immunomodulators and immunosuppressants: Secondary | ICD-10-CM

## 2024-08-05 NOTE — Patient Instructions (Signed)
 Your provider has requested that you go to the basement level for lab work before leaving today. Press B on the elevator. The lab is located at the first door on the left as you exit the elevator.  DO THE LIPID PANEL FASTING, the lab is open 7:30am-5:30pm. Also Dr Avram wants a stool test done and some other lab test.  Due to recent changes in healthcare laws, you may see the results of your imaging and laboratory studies on MyChart before your provider has had a chance to review them.  We understand that in some cases there may be results that are confusing or concerning to you. Not all laboratory results come back in the same time frame and the provider may be waiting for multiple results in order to interpret others.  Please give us  48 hours in order for your provider to thoroughly review all the results before contacting the office for clarification of your results.   We will work on getting Skyrizi for you.  We will be in touch with your lab results.   I appreciate the opportunity to care for you. Lupita Avram, MD, [facility]

## 2024-08-05 NOTE — Telephone Encounter (Signed)
 Maintenance dose will be 180 mg every 8 weeks  Supposed to start with lower dose

## 2024-08-05 NOTE — Telephone Encounter (Signed)
 I have ordered loading infusions through the infusion center for [facility] and this prescription is for the subcutaneous Skyrizi that starts 4 weeks after the last infusion

## 2024-08-05 NOTE — Telephone Encounter (Signed)
 Just to clarify, will it be 360 mg or the 180 mg for maintenance.

## 2024-08-05 NOTE — Progress Notes (Signed)
 Charles [name] 53 y.o. 11/04/1970 [id number]  Assessment & Plan:   Encounter Diagnoses  Name Primary?   Ileal pouchitis (HCC) Yes   Crohn's disease of large intestine with other complication (HCC)    SBO (small bowel obstruction) (HCC)    Signs and symptoms compatible with recurrent SBO.  Cause not clear he certainly is at risk for adhesions but patients with pouchitis flaring can have problems also and it may present in just this fashion without altered defecation previously.  We talked about different treatments, he has been on anti-TNF in the past.  Will try [employer].  Explained that we will need to monitor lipids liver chemistries. Orders Placed This Encounter  Procedures   Calprotectin, Fecal   Lipid panel   C-reactive protein   Sedimentation rate     Subjective:  Gastroenterology summary   Crohn's disease: Diagnosed in his 70s.  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  Has had problems with pouchitis and has been treated with steroids, mesalamine  suppositories, Remicade , Humira , azathioprine  (caused pancreatitis).  Azathioprine  had been added to Humira  in 2022 due to low level Humira  antibodies.  He stopped Humira  June 2023.  Thought to have had pulmonary complications of his Crohn's disease as well.     Has had recurrent small bowel obstructions last 2016   Last pouchoscopy 08/02/2022 patchy ulcerated areas as previous normal ileal biopsies, pouch with mild focal ulceration and active inflammation   GERD history of PPI therapy was able to wean off   Arthropathy-treated with Celebrex   Sarcoidosis-pulmonary and skin ----------------------------------------------------------------------------------------------  Chief Complaint: Pouchitis and small bowel obstruction issues  HPI 53 year old man with a history of Crohn's disease and ileal pouchitis status post total colectomy and ileoanal pouch, also has pulmonary  sarcoidosis.  He returns for follow-up, he was last seen in May at a pouchoscopy.  Pouchitis was present on endoscopic and pathologic examinations and appeared worse than a 2023 examination.  He has not been on biologic or other immunosuppressive therapy.  He has taken some intermittent antibiotics and is improved compared to where he was but he has had episodes of suspected bowel obstruction as outlined below.  Recurrent bowel obstruction - Experiences recurrent episodes characterized by sudden quietness followed by worsening crampy abdominal pain. - First episode occurred in July during a period of high stress while moving. - Subsequent episodes occurred two months later and again last month while in a hotel room in [place]. - Last episode associated with nausea and vomiting, which resolved after passing gas. - Previously hospitalized for bowel obstruction; continued eating during the episode, which exacerbated symptoms. - Concerned about the potential for adhesions and the role of pouchitis in causing obstructions.  Pouchitis - History of pouchitis, intermittently treated with antibiotics. - Concern about pouchitis contributing to recurrent bowel obstructions.  Psychiatric history and medication effects - Previously treated with Cymbalta  for anxiety, but discontinued due to significant side effects, including concerns about gastrointestinal effects. - Currently taking Lexapro  for anxiety. - Cautious about starting new medications due to prior adverse effects.  Wt Readings from Last 3 Encounters:  08/05/24 161 lb (73 kg)  08/02/24 162 lb 4.8 oz (73.6 kg)  05/24/24 156 lb 6.4 oz (70.9 kg)   Pulmonary visit 08/02/2024 reviewed, using Brezti (budesonide  glycopyrrolate  formoterol ) inhaler to treat sarcoid lung disease.   Chemistry      Component Value Date/Time   NA 138 05/24/2024 1222   K 4.1 05/24/2024 1222  CL 99 05/24/2024 1222   CO2 26 05/24/2024 1222   BUN 14 05/24/2024 1222    CREATININE 0.89 05/24/2024 1222   CREATININE 1.21 07/21/2020 1603      Component Value Date/Time   CALCIUM  10.1 05/24/2024 1222   ALKPHOS 65 05/24/2024 1222   AST 27 05/24/2024 1222   ALT 18 05/24/2024 1222   BILITOT 1.8 (H) 05/24/2024 1222     Lab Results  Component Value Date   TSH 2.09 05/24/2024   Lab Results  Component Value Date   WBC 7.3 05/24/2024   HGB 14.9 05/24/2024   HCT 43.4 05/24/2024   MCV 94.5 05/24/2024   PLT 275.0 05/24/2024   QuantiFERON testing negative in October also.  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   Drug-induced acute pancreatitis without infection or necrosis 02/28/2021   Dysplastic nevi    GERD (gastroesophageal reflux disease)    Herniated disc    History of proctitis 2006   IBD (inflammatory bowel disease)    Incisional hernia    Insomnia    Migraine headache    Morton's neuroma of right foot    Osteopenia    Pouchitis (HCC) 2006   Small bowel obstruction due to adhesions (HCC) 02/09/2015   Thyroid  disease    Vitamin D  deficiency    Past Surgical History:  Procedure Laterality Date   COLECTOMY     COLONOSCOPY W/ BIOPSIES  04/16/2006   crohn's colitis   FLEXIBLE SIGMOIDOSCOPY  2006 - 02/2014   ileocolitis, pouchitis.  with pouch ulcer.    FUNCTIONAL ENDOSCOPIC SINUS SURGERY  12/2003   partial ethmoidectomy.  Dr Mable   ileoanal pull-through     LUMBAR LAMINECTOMY/DECOMPRESSION MICRODISCECTOMY Right 10/13/2015   Procedure: Right Lumbar four- five Microdiskectomy;  Surgeon: Fairy Levels, MD;  Location: [facility];  Service: Neurosurgery;  Laterality: Right;  Right L4-5 Microdiskectomy   sacroilitis     SIGMOIDOSCOPY  01/2005    VENTRAL HERNIA REPAIR  06/2014   Social History   Social History Narrative   Father a diplomatic services operational officer in [place].   Married to Dr. 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   Review of Systems As per HPI  Objective:   Physical Exam @BP  110/70   Pulse 97   Ht 5' 10 (1.778 m)   Wt 161 lb (73 kg)   BMI 23.10 kg/m @  General:  NAD Eyes:   anicteric Lungs:  clear Heart::  S1S2 no rubs, murmurs or gallops Abdomen:  soft and nontender, BS+, scars     Data Reviewed:  See HPI   I spent 47 minutes of time, including in depth chart review, independent review of results as outlined above, communicating results with the patient directly, face-to-face time with the patient, coordinating care, ordering studies and medications  as appropriate, and documentation.      [1]  Allergies Allergen Reactions   Azathioprine  Other (See Comments)    Serum sickness / acute hypersensitivity reaction with possible Acute pancreatitis   Mercaptopurine Nausea And Vomiting    Felt ill within 2 days of initiating therapy.  [2]  Current Meds  Medication Sig   albuterol  (VENTOLIN  HFA) 108 (90 Base) MCG/ACT inhaler Inhale 1 puff into the lungs every 6 (six) hours as needed for wheezing or shortness of breath.   Ascorbic Acid (VITAMIN C PO) Take 1 tablet by mouth daily.   azelastine  (ASTELIN ) 0.1 % nasal spray Place 2 sprays into both nostrils 2 (two) times daily. Use in each nostril as directed   betamethasone dipropionate 0.05 % cream Apply topically.   [START ON 09/02/2024] budesonide -glycopyrrolate -formoterol  (BREZTRI  AEROSPHERE)  160-9-4.8 MCG/ACT AERO inhaler Inhale 2 puffs into the lungs in the morning and at bedtime.   calcipotriene (DOVONOX) 0.005 % cream Apply topically.   celecoxib  (CELEBREX ) 100 MG capsule TAKE 1 CAPSULE BY MOUTH 2 TIMES DAILY AS NEEDED   cholecalciferol (VITAMIN D ) 1000 units tablet Take 2,000 Units by mouth daily.   Cyanocobalamin  (VITAMIN B-12 PO) Take 500 mcg by mouth daily.   escitalopram  (LEXAPRO ) 10 MG tablet Take 1 tablet (10 mg total) by mouth daily.   hydrocortisone  2.5 % cream Apply 1 Application topically 4 (four) times daily.   mesalamine  (CANASA ) 1000 MG suppository Place 1 suppository (1,000 mg total) rectally at bedtime. (Patient taking differently: Place 1,000 mg rectally as needed.)   montelukast  (SINGULAIR ) 10 MG tablet Take 1 tablet (10 mg total) by mouth at bedtime.   rizatriptan  (MAXALT ) 5 MG tablet Take 1 tablet (5 mg total) by mouth as needed for migraine. May repeat in 2 hours if needed   rosuvastatin  (CRESTOR ) 5 MG tablet Take 1 tablet (5 mg total) by mouth daily.   Spacer/Aero-Holding Chambers (OPTICHAMBER DIAMOND -LG MASK) DEVI Use as directed with inhaler.

## 2024-08-06 ENCOUNTER — Other Ambulatory Visit (HOSPITAL_COMMUNITY): Payer: Self-pay

## 2024-08-06 ENCOUNTER — Telehealth: Payer: Self-pay

## 2024-08-06 ENCOUNTER — Other Ambulatory Visit (INDEPENDENT_AMBULATORY_CARE_PROVIDER_SITE_OTHER)

## 2024-08-06 DIAGNOSIS — Z7960 Long term (current) use of unspecified immunomodulators and immunosuppressants: Secondary | ICD-10-CM

## 2024-08-06 DIAGNOSIS — Z1159 Encounter for screening for other viral diseases: Secondary | ICD-10-CM

## 2024-08-06 DIAGNOSIS — K50118 Crohn's disease of large intestine with other complication: Secondary | ICD-10-CM | POA: Diagnosis not present

## 2024-08-06 LAB — LIPID PANEL
Cholesterol: 168 mg/dL (ref 28–200)
HDL: 56.2 mg/dL
LDL Cholesterol: 85 mg/dL (ref 10–99)
NonHDL: 111.66
Total CHOL/HDL Ratio: 3
Triglycerides: 135 mg/dL (ref 10.0–149.0)
VLDL: 27 mg/dL (ref 0.0–40.0)

## 2024-08-06 LAB — HEPATITIS B SURFACE ANTIGEN: Hepatitis B Surface Ag: NONREACTIVE

## 2024-08-06 LAB — HEPATITIS B CORE ANTIBODY, TOTAL: Hep B Core Total Ab: NONREACTIVE

## 2024-08-06 LAB — C-REACTIVE PROTEIN: CRP: 1 mg/dL (ref 1.0–20.0)

## 2024-08-06 LAB — HEPATITIS B SURFACE ANTIBODY,QUALITATIVE: Hep B S Ab: REACTIVE — AB

## 2024-08-06 LAB — SEDIMENTATION RATE: Sed Rate: 44 mm/h — ABNORMAL HIGH (ref 0–20)

## 2024-08-06 NOTE — Telephone Encounter (Signed)
 Pharmacy Patient Advocate Encounter   Received notification from Pt Calls Messages that prior authorization for Skyrizi 180MG /1.2ML (150MG /ML) single-dose prefilled cartridge with on-body injector is required/requested.   Insurance verification completed.   The patient is insured through [facility].   Per test claim: PA required; PA submitted to above mentioned insurance via Latent Key/confirmation #/EOC [id number] Status is pending

## 2024-08-06 NOTE — Telephone Encounter (Signed)
 Pharmacy Patient Advocate Encounter  Received notification from [facility] that Prior Authorization for Skyrizi 180MG /1.2ML (150MG /ML) single-dose prefilled cartridge with on-body injector has been DENIED.  Full denial letter will be uploaded to the media tab. See denial reason below.  This medicine is covered only if:  One of the following:  (A) You have been approved for loading dose of Skyrizi under an active [employer] medical benefit prior authorization for the treatment of moderately to severely active Crohn's disease.  (B) Both of the following:  (I) You are currently on Skyrizi therapy as documented by claims history or medical records (document drug, date, and duration of therapy) (II) You have not received a manufacturer supplied sample at no cost in the prescriber's office, or any form of assistance from the [employer] sponsored Skyrizi Complete Program (for example: sample card which can be redeemed at a pharmacy for a free supply of mediation) as a means to establish as a current user of [employer]  PA #/Case ID/Reference #: [id number]

## 2024-08-08 ENCOUNTER — Ambulatory Visit: Payer: Self-pay | Admitting: Internal Medicine

## 2024-08-10 ENCOUNTER — Telehealth: Payer: Self-pay | Admitting: Pharmacy Technician

## 2024-08-10 LAB — CALPROTECTIN, FECAL: Calprotectin, Fecal: 680 ug/g — ABNORMAL HIGH (ref 0–120)

## 2024-08-10 NOTE — Telephone Encounter (Signed)
 Rosina, Please submit shara for Skyrizi maintenance dosing. Once both IV and SQ dosing are approved patient will be scheduled as soon as possible.   Auth Submission: APPROVED Site of care: Site of care: [facility] Payer: [employer] Medication & CPT/J Code(s) submitted: Skyrizi Viann) [phone] Diagnosis Code:  Route of submission (phone, fax, portal): PORTAL Phone # Fax # Auth type: Buy/Bill PB Units/visits requested: 3 DOES Reference number: [id number] Approval from: 08/10/24 to 12/09/24   Co-pay card: pending - [employer] has been notified.

## 2024-08-11 ENCOUNTER — Other Ambulatory Visit (HOSPITAL_COMMUNITY): Payer: Self-pay

## 2024-08-11 ENCOUNTER — Telehealth: Payer: Self-pay

## 2024-08-11 ENCOUNTER — Encounter: Payer: Self-pay | Admitting: Internal Medicine

## 2024-08-11 NOTE — Telephone Encounter (Addendum)
 Pharmacy Patient Advocate Encounter   Received notification from Pt Calls Messages that prior authorization for Skyrizi 180MG /1.2ML  single-dose prefilled cartridge with on-body injector is required/requested.   Insurance verification completed.   The patient is insured through [facility].   Prior Authorization for [employer] 180MG /1.2ML  single-dose prefilled cartridge with on-body injector has been APPROVED from 08-11-2024 to 08-11-2025. Ran test claim, Copay is $70.00. This test claim was processed through [facility]- copay amounts may vary at other pharmacies due to pharmacy/plan contracts, or as the patient moves through the different stages of their insurance plan.   PA #/Case ID/Reference #: [id number]   Savings card added, bringing co-pay card down to $0.00 Signed up for [facility] program

## 2024-08-11 NOTE — Telephone Encounter (Signed)
 Insurance allows patient to fill at [facility]

## 2024-08-16 ENCOUNTER — Other Ambulatory Visit: Payer: Self-pay

## 2024-08-16 ENCOUNTER — Other Ambulatory Visit: Payer: Self-pay | Admitting: Family Medicine

## 2024-08-16 MED ORDER — SKYRIZI 180 MG/1.2ML ~~LOC~~ SOCT: 180.0000 mg | SUBCUTANEOUS | 6 refills | Status: AC

## 2024-08-16 NOTE — Telephone Encounter (Signed)
 Prescription for Skyrizi 180 mg/1.2 ml transmitted to [facility]

## 2024-08-20 ENCOUNTER — Other Ambulatory Visit: Payer: Self-pay

## 2024-08-25 ENCOUNTER — Other Ambulatory Visit (HOSPITAL_COMMUNITY): Payer: Self-pay

## 2024-08-26 ENCOUNTER — Other Ambulatory Visit (HOSPITAL_COMMUNITY): Payer: Self-pay

## 2024-08-30 ENCOUNTER — Other Ambulatory Visit: Payer: Self-pay

## 2024-08-30 ENCOUNTER — Ambulatory Visit

## 2024-08-30 VITALS — BP 137/93 | HR 73 | Temp 97.6°F | Resp 14 | Ht 70.0 in | Wt 160.0 lb

## 2024-08-30 DIAGNOSIS — K50118 Crohn's disease of large intestine with other complication: Secondary | ICD-10-CM | POA: Diagnosis not present

## 2024-08-30 DIAGNOSIS — K91850 Pouchitis: Secondary | ICD-10-CM

## 2024-08-30 DIAGNOSIS — K50819 Crohn's disease of both small and large intestine with unspecified complications: Secondary | ICD-10-CM

## 2024-08-30 MED ORDER — SODIUM CHLORIDE 0.9 % IV SOLN
600.0000 mg | Freq: Once | INTRAVENOUS | Status: AC
  Administered 2024-08-30: 600 mg via INTRAVENOUS
  Filled 2024-08-30: qty 10

## 2024-08-30 NOTE — Progress Notes (Signed)
 Diagnosis:  Crohn's Disease  Provider:  Mannam, Praveen MD  Procedure: IV Infusion  IV Type: Peripheral, IV Location: L Antecubital   Skyrizi  (risankizumab -rzaa), Dose: 600 mg  Infusion Start Time: 0849  Infusion Stop Time: 0958  Post Infusion IV Care: Observation period completed and Peripheral IV Discontinued  Discharge: Condition: Good, Destination: Home . AVS Declined  Performed by:  Maximiano JONELLE Pouch, LPN

## 2024-08-31 MED ORDER — SKYRIZI 180 MG/1.2ML ~~LOC~~ SOCT: 180.0000 ug | SUBCUTANEOUS | 5 refills | Status: AC

## 2024-09-20 ENCOUNTER — Encounter: Admitting: Family Medicine

## 2024-09-21 ENCOUNTER — Ambulatory Visit: Admitting: Family Medicine

## 2024-09-21 ENCOUNTER — Encounter: Payer: Self-pay | Admitting: Family Medicine

## 2024-09-21 VITALS — BP 132/88 | HR 89 | Ht 70.0 in

## 2024-09-21 DIAGNOSIS — E538 Deficiency of other specified B group vitamins: Secondary | ICD-10-CM

## 2024-09-21 DIAGNOSIS — E785 Hyperlipidemia, unspecified: Secondary | ICD-10-CM | POA: Diagnosis not present

## 2024-09-21 DIAGNOSIS — Z23 Encounter for immunization: Secondary | ICD-10-CM

## 2024-09-21 DIAGNOSIS — K50118 Crohn's disease of large intestine with other complication: Secondary | ICD-10-CM | POA: Diagnosis not present

## 2024-09-21 DIAGNOSIS — Z0000 Encounter for general adult medical examination without abnormal findings: Secondary | ICD-10-CM | POA: Diagnosis not present

## 2024-09-21 DIAGNOSIS — E559 Vitamin D deficiency, unspecified: Secondary | ICD-10-CM

## 2024-09-21 DIAGNOSIS — Z0001 Encounter for general adult medical examination with abnormal findings: Secondary | ICD-10-CM

## 2024-09-21 DIAGNOSIS — Z131 Encounter for screening for diabetes mellitus: Secondary | ICD-10-CM | POA: Diagnosis not present

## 2024-09-21 DIAGNOSIS — K219 Gastro-esophageal reflux disease without esophagitis: Secondary | ICD-10-CM | POA: Diagnosis not present

## 2024-09-21 DIAGNOSIS — F411 Generalized anxiety disorder: Secondary | ICD-10-CM | POA: Diagnosis not present

## 2024-09-21 MED ORDER — ESCITALOPRAM OXALATE 10 MG PO TABS: 5.0000 mg | ORAL_TABLET | Freq: Every day | ORAL | Status: AC

## 2024-09-21 MED ORDER — ALBUTEROL SULFATE HFA 108 (90 BASE) MCG/ACT IN AERS: 1.0000 | INHALATION_SPRAY | Freq: Four times a day (QID) | RESPIRATORY_TRACT | 2 refills | Status: AC | PRN

## 2024-09-21 MED ORDER — PANTOPRAZOLE SODIUM 40 MG PO TBEC: 40.0000 mg | DELAYED_RELEASE_TABLET | Freq: Every day | ORAL | 3 refills | Status: AC

## 2024-09-21 MED ORDER — RIZATRIPTAN BENZOATE 5 MG PO TABS: 5.0000 mg | ORAL_TABLET | ORAL | 0 refills | Status: AC | PRN

## 2024-09-21 NOTE — Assessment & Plan Note (Signed)
 Will restart Protonix  40 mg daily.  He has been doing this with leftover prescription which does seem to be helping.  He would like to avoid using this for a long time however.

## 2024-09-21 NOTE — Patient Instructions (Addendum)
 It was very nice to see you today!  VISIT SUMMARY: During your annual physical exam, we discussed your ongoing management of Crohn's disease, generalized anxiety disorder, GERD, dyslipidemia, and medial epicondylitis. We also reviewed your recent vaccinations and overall health maintenance.  YOUR PLAN: CROHN'S DISEASE: Your gastrointestinal symptoms have improved with Skyrizi , and the initial headaches have resolved. -Continue taking Skyrizi  as prescribed. -Schedule a follow-up colonoscopy next fall.  GENERALIZED ANXIETY DISORDER: Your condition is stable on Lexapro  5 mg, and you prefer not to increase the dose. -Continue taking Lexapro  5 mg daily.  GASTROESOPHAGEAL REFLUX DISEASE (GERD): Your chronic cough may be related to GERD. -Start taking Protonix  for a few months.  DYSLIPIDEMIA: Your lipid levels are stable with no current issues. -Continue current management and monitoring.  MEDIAL EPICONDYLITIS: Your symptoms are consistent with golfer's elbow and are exacerbated by certain movements. -Follow the exercises and stretches handout provided. -Use an elbow strap and apply ice as needed. -Set up an ergonomic work environment.  GENERAL HEALTH MAINTENANCE: You received the Prevnar 20 vaccine and engage in regular physical activity. -Continue your regular physical activity regimen. -Maintain up-to-date vaccinations.  Return in about 1 year (around 09/21/2025) for Annual Physical.   Take care, Dr Kennyth  PLEASE NOTE:  If you had any lab tests, please let us  know if you have not heard back within a few days. You may see your results on mychart before we have a chance to review them but we will give you a call once they are reviewed by us .   If we ordered any referrals today, please let us  know if you have not heard from their office within the next week.   If you had any urgent prescriptions sent in today, please check with the pharmacy within an hour of our visit to make sure the  prescription was transmitted appropriately.   Please try these tips to maintain a healthy lifestyle:  Eat at least 3 REAL meals and 1-2 snacks per day.  Aim for no more than 5 hours between eating.  If you eat breakfast, please do so within one hour of getting up.   Each meal should contain half fruits/vegetables, one quarter protein, and one quarter carbs (no bigger than a computer mouse)  Cut down on sweet beverages. This includes juice, soda, and sweet tea.   Drink at least 1 glass of water with each meal and aim for at least 8 glasses per day  Exercise at least 150 minutes every week.

## 2024-09-21 NOTE — Assessment & Plan Note (Signed)
 He is now on Lexapro  5 mg daily.  Doing very well with this dose.  No significant side effects.  Mood is well-controlled.

## 2024-09-21 NOTE — Assessment & Plan Note (Signed)
Continue management per cardiology. 

## 2024-09-21 NOTE — Assessment & Plan Note (Signed)
 Now on Skyrizi  per GI.  Tolerating well.

## 2024-09-27 ENCOUNTER — Ambulatory Visit

## 2024-10-04 ENCOUNTER — Ambulatory Visit

## 2024-11-30 ENCOUNTER — Encounter: Admitting: Family Medicine

## 2025-01-31 ENCOUNTER — Ambulatory Visit (HOSPITAL_BASED_OUTPATIENT_CLINIC_OR_DEPARTMENT_OTHER): Admitting: Pulmonary Disease

## 2025-09-22 ENCOUNTER — Encounter: Admitting: Family Medicine
